# Patient Record
Sex: Female | Born: 1965
Health system: Southern US, Community
[De-identification: ages and names within clinical notes are randomized; demographics above are authoritative.]

## PROBLEM LIST (undated history)

## (undated) DIAGNOSIS — I10 Essential (primary) hypertension: Secondary | ICD-10-CM

## (undated) DIAGNOSIS — G4730 Sleep apnea, unspecified: Secondary | ICD-10-CM

## (undated) DIAGNOSIS — E785 Hyperlipidemia, unspecified: Secondary | ICD-10-CM

## (undated) DIAGNOSIS — T7840XA Allergy, unspecified, initial encounter: Secondary | ICD-10-CM

## (undated) DIAGNOSIS — R011 Cardiac murmur, unspecified: Secondary | ICD-10-CM

## (undated) DIAGNOSIS — E249 Cushing's syndrome, unspecified: Secondary | ICD-10-CM

## (undated) HISTORY — DX: Allergy, unspecified, initial encounter: T78.40XA

## (undated) HISTORY — DX: Hyperlipidemia, unspecified: E78.5

## (undated) HISTORY — DX: Cardiac murmur, unspecified: R01.1

## (undated) HISTORY — DX: Essential (primary) hypertension: I10

## (undated) HISTORY — DX: Cushing's syndrome, unspecified: E24.9

## (undated) HISTORY — DX: Sleep apnea, unspecified: G47.30

---

## 1997-02-05 HISTORY — PX: CERVICAL CONIZATION W/BX: SHX1330

## 1997-03-08 ENCOUNTER — Ambulatory Visit (HOSPITAL_COMMUNITY): Admission: RE | Admit: 1997-03-08 | Discharge: 1997-03-08 | Payer: Self-pay | Admitting: Obstetrics

## 1998-11-01 ENCOUNTER — Other Ambulatory Visit: Admission: RE | Admit: 1998-11-01 | Discharge: 1998-11-01 | Payer: Self-pay | Admitting: Obstetrics

## 1999-01-11 ENCOUNTER — Inpatient Hospital Stay (HOSPITAL_COMMUNITY): Admission: EM | Admit: 1999-01-11 | Discharge: 1999-01-14 | Payer: Self-pay | Admitting: Emergency Medicine

## 1999-05-25 ENCOUNTER — Ambulatory Visit (HOSPITAL_COMMUNITY): Admission: RE | Admit: 1999-05-25 | Discharge: 1999-05-25 | Payer: Self-pay | Admitting: Family Medicine

## 1999-05-25 ENCOUNTER — Encounter: Payer: Self-pay | Admitting: Family Medicine

## 1999-06-28 ENCOUNTER — Inpatient Hospital Stay (HOSPITAL_COMMUNITY): Admission: AD | Admit: 1999-06-28 | Discharge: 1999-06-28 | Payer: Self-pay | Admitting: Obstetrics

## 1999-09-13 ENCOUNTER — Inpatient Hospital Stay (HOSPITAL_COMMUNITY): Admission: AD | Admit: 1999-09-13 | Discharge: 1999-09-13 | Payer: Self-pay | Admitting: Obstetrics

## 1999-10-19 ENCOUNTER — Inpatient Hospital Stay (HOSPITAL_COMMUNITY): Admission: AD | Admit: 1999-10-19 | Discharge: 1999-10-19 | Payer: Self-pay | Admitting: Obstetrics

## 1999-11-29 ENCOUNTER — Encounter (INDEPENDENT_AMBULATORY_CARE_PROVIDER_SITE_OTHER): Payer: Self-pay | Admitting: *Deleted

## 1999-11-29 ENCOUNTER — Ambulatory Visit (HOSPITAL_BASED_OUTPATIENT_CLINIC_OR_DEPARTMENT_OTHER): Admission: RE | Admit: 1999-11-29 | Discharge: 1999-11-29 | Payer: Self-pay | Admitting: General Surgery

## 1999-12-07 ENCOUNTER — Inpatient Hospital Stay (HOSPITAL_COMMUNITY): Admission: AD | Admit: 1999-12-07 | Discharge: 1999-12-07 | Payer: Self-pay | Admitting: Obstetrics

## 1999-12-09 ENCOUNTER — Inpatient Hospital Stay (HOSPITAL_COMMUNITY): Admission: AD | Admit: 1999-12-09 | Discharge: 1999-12-09 | Payer: Self-pay | Admitting: Obstetrics

## 1999-12-20 ENCOUNTER — Inpatient Hospital Stay (HOSPITAL_COMMUNITY): Admission: AD | Admit: 1999-12-20 | Discharge: 1999-12-20 | Payer: Self-pay | Admitting: Obstetrics

## 2000-01-09 ENCOUNTER — Encounter (INDEPENDENT_AMBULATORY_CARE_PROVIDER_SITE_OTHER): Payer: Self-pay

## 2000-01-09 ENCOUNTER — Inpatient Hospital Stay (HOSPITAL_COMMUNITY): Admission: AD | Admit: 2000-01-09 | Discharge: 2000-01-11 | Payer: Self-pay | Admitting: Obstetrics

## 2000-01-10 HISTORY — PX: TUBAL LIGATION: SHX77

## 2000-02-20 ENCOUNTER — Other Ambulatory Visit: Admission: RE | Admit: 2000-02-20 | Discharge: 2000-02-20 | Payer: Self-pay | Admitting: Obstetrics

## 2003-05-28 ENCOUNTER — Encounter: Admission: RE | Admit: 2003-05-28 | Discharge: 2003-05-28 | Payer: Self-pay | Admitting: Family Medicine

## 2003-06-18 ENCOUNTER — Inpatient Hospital Stay (HOSPITAL_COMMUNITY): Admission: EM | Admit: 2003-06-18 | Discharge: 2003-06-21 | Payer: Self-pay | Admitting: Emergency Medicine

## 2004-04-28 ENCOUNTER — Encounter: Admission: RE | Admit: 2004-04-28 | Discharge: 2004-04-28 | Payer: Self-pay | Admitting: Family Medicine

## 2004-10-25 ENCOUNTER — Other Ambulatory Visit: Admission: RE | Admit: 2004-10-25 | Discharge: 2004-10-25 | Payer: Self-pay | Admitting: Family Medicine

## 2004-11-13 LAB — HM MAMMOGRAPHY

## 2005-04-20 ENCOUNTER — Inpatient Hospital Stay (HOSPITAL_COMMUNITY): Admission: EM | Admit: 2005-04-20 | Discharge: 2005-04-24 | Payer: Self-pay | Admitting: Emergency Medicine

## 2005-09-10 ENCOUNTER — Ambulatory Visit: Payer: Self-pay | Admitting: Family Medicine

## 2005-09-11 ENCOUNTER — Encounter: Admission: RE | Admit: 2005-09-11 | Discharge: 2005-09-11 | Payer: Self-pay | Admitting: Family Medicine

## 2006-07-15 ENCOUNTER — Ambulatory Visit: Payer: Self-pay | Admitting: Family Medicine

## 2006-08-21 ENCOUNTER — Ambulatory Visit: Payer: Self-pay | Admitting: Family Medicine

## 2006-11-05 ENCOUNTER — Ambulatory Visit: Payer: Self-pay | Admitting: Family Medicine

## 2006-11-06 ENCOUNTER — Ambulatory Visit: Payer: Self-pay | Admitting: Family Medicine

## 2006-11-06 ENCOUNTER — Encounter: Admission: RE | Admit: 2006-11-06 | Discharge: 2006-11-06 | Payer: Self-pay | Admitting: Family Medicine

## 2006-11-07 ENCOUNTER — Inpatient Hospital Stay (HOSPITAL_COMMUNITY): Admission: EM | Admit: 2006-11-07 | Discharge: 2006-11-10 | Payer: Self-pay | Admitting: *Deleted

## 2006-11-12 ENCOUNTER — Ambulatory Visit: Payer: Self-pay | Admitting: Family Medicine

## 2006-11-26 ENCOUNTER — Ambulatory Visit: Payer: Self-pay | Admitting: Family Medicine

## 2007-02-10 ENCOUNTER — Ambulatory Visit: Payer: Self-pay | Admitting: Family Medicine

## 2007-03-13 ENCOUNTER — Ambulatory Visit: Payer: Self-pay | Admitting: Family Medicine

## 2007-06-23 ENCOUNTER — Ambulatory Visit: Payer: Self-pay | Admitting: Family Medicine

## 2007-06-23 ENCOUNTER — Other Ambulatory Visit: Admission: RE | Admit: 2007-06-23 | Discharge: 2007-06-23 | Payer: Self-pay | Admitting: Family Medicine

## 2007-06-24 ENCOUNTER — Ambulatory Visit: Payer: Self-pay | Admitting: Family Medicine

## 2007-06-25 ENCOUNTER — Encounter: Admission: RE | Admit: 2007-06-25 | Discharge: 2007-06-25 | Payer: Self-pay | Admitting: Family Medicine

## 2007-07-01 ENCOUNTER — Ambulatory Visit: Payer: Self-pay | Admitting: Family Medicine

## 2007-07-25 ENCOUNTER — Ambulatory Visit: Payer: Self-pay | Admitting: Family Medicine

## 2008-01-23 ENCOUNTER — Ambulatory Visit: Payer: Self-pay | Admitting: Family Medicine

## 2008-03-11 ENCOUNTER — Ambulatory Visit: Payer: Self-pay | Admitting: Family Medicine

## 2008-09-23 IMAGING — CR DG CHEST 2V
2 series · 2 of 2 positions shown · non-contrast
Comparison: 09/11/05.

CLINICAL DATA: Cough, wheezing, history of asthma, previous smoker. 
 CHEST - 2 VIEW:

[view not recorded (1 of 2)]
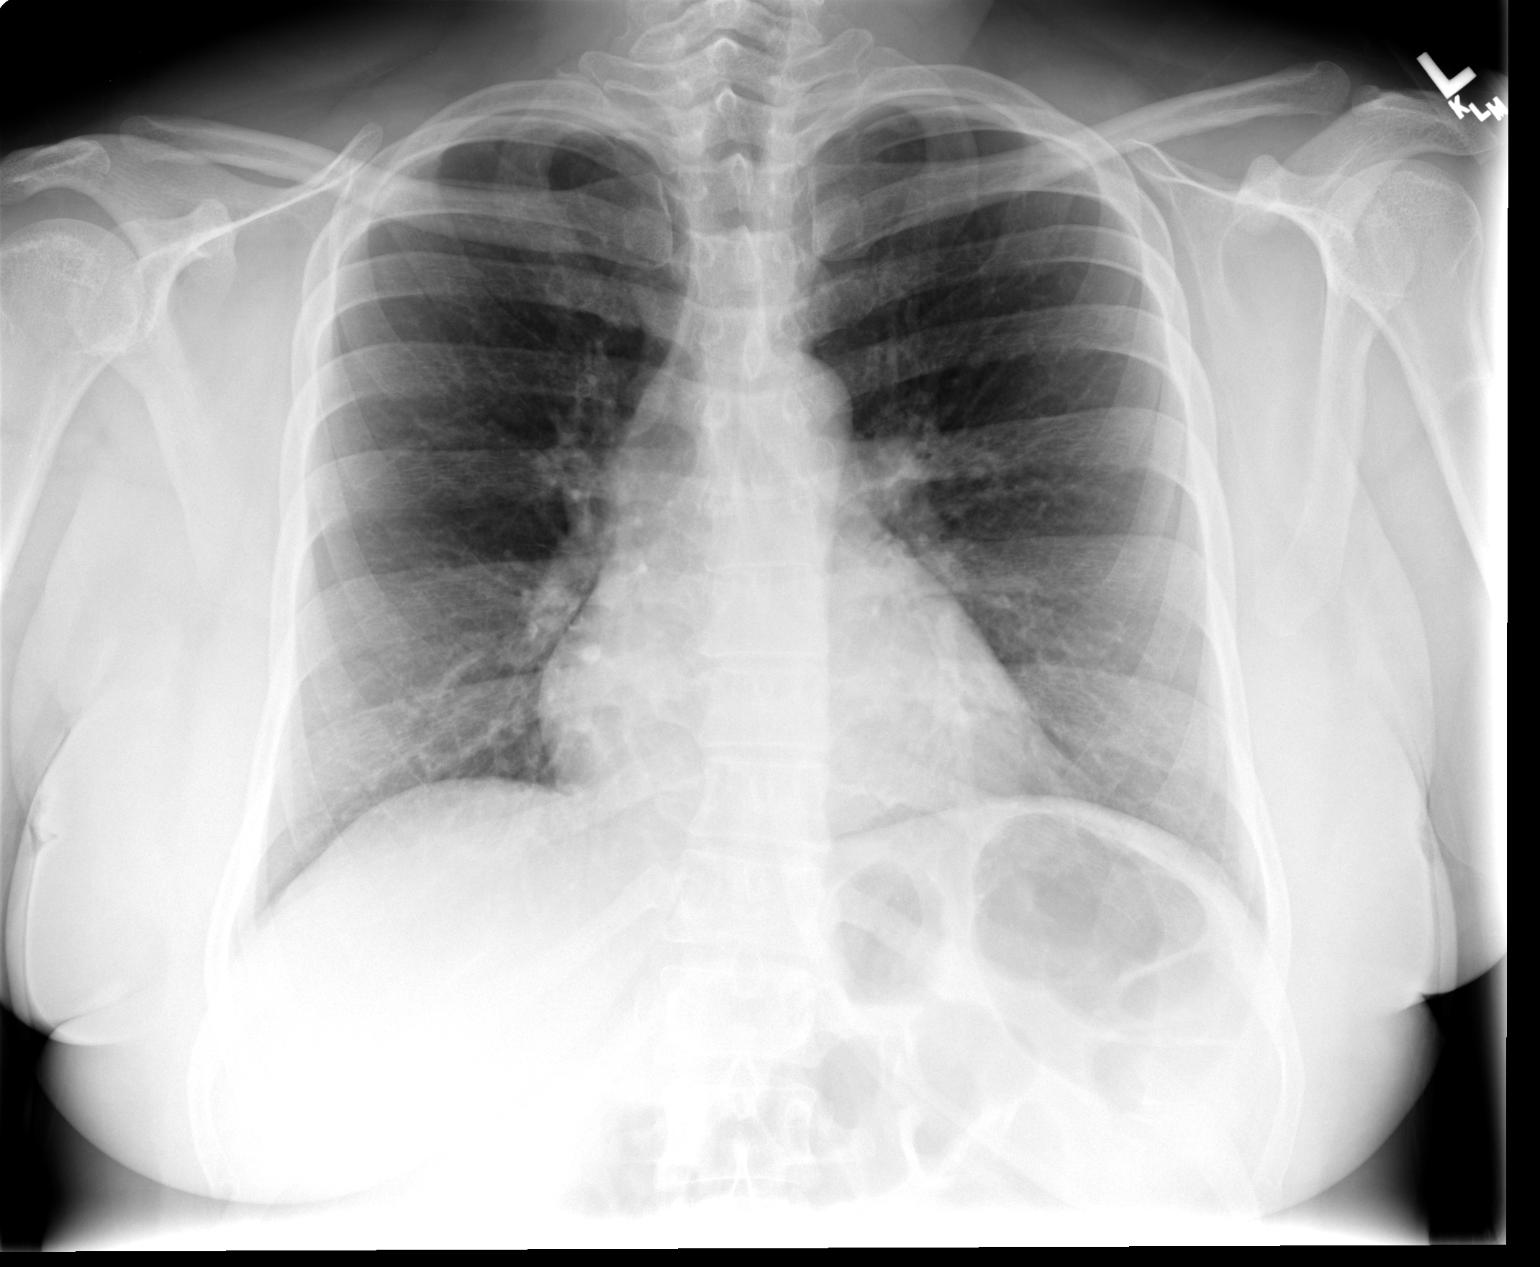

[view not recorded (2 of 2)]
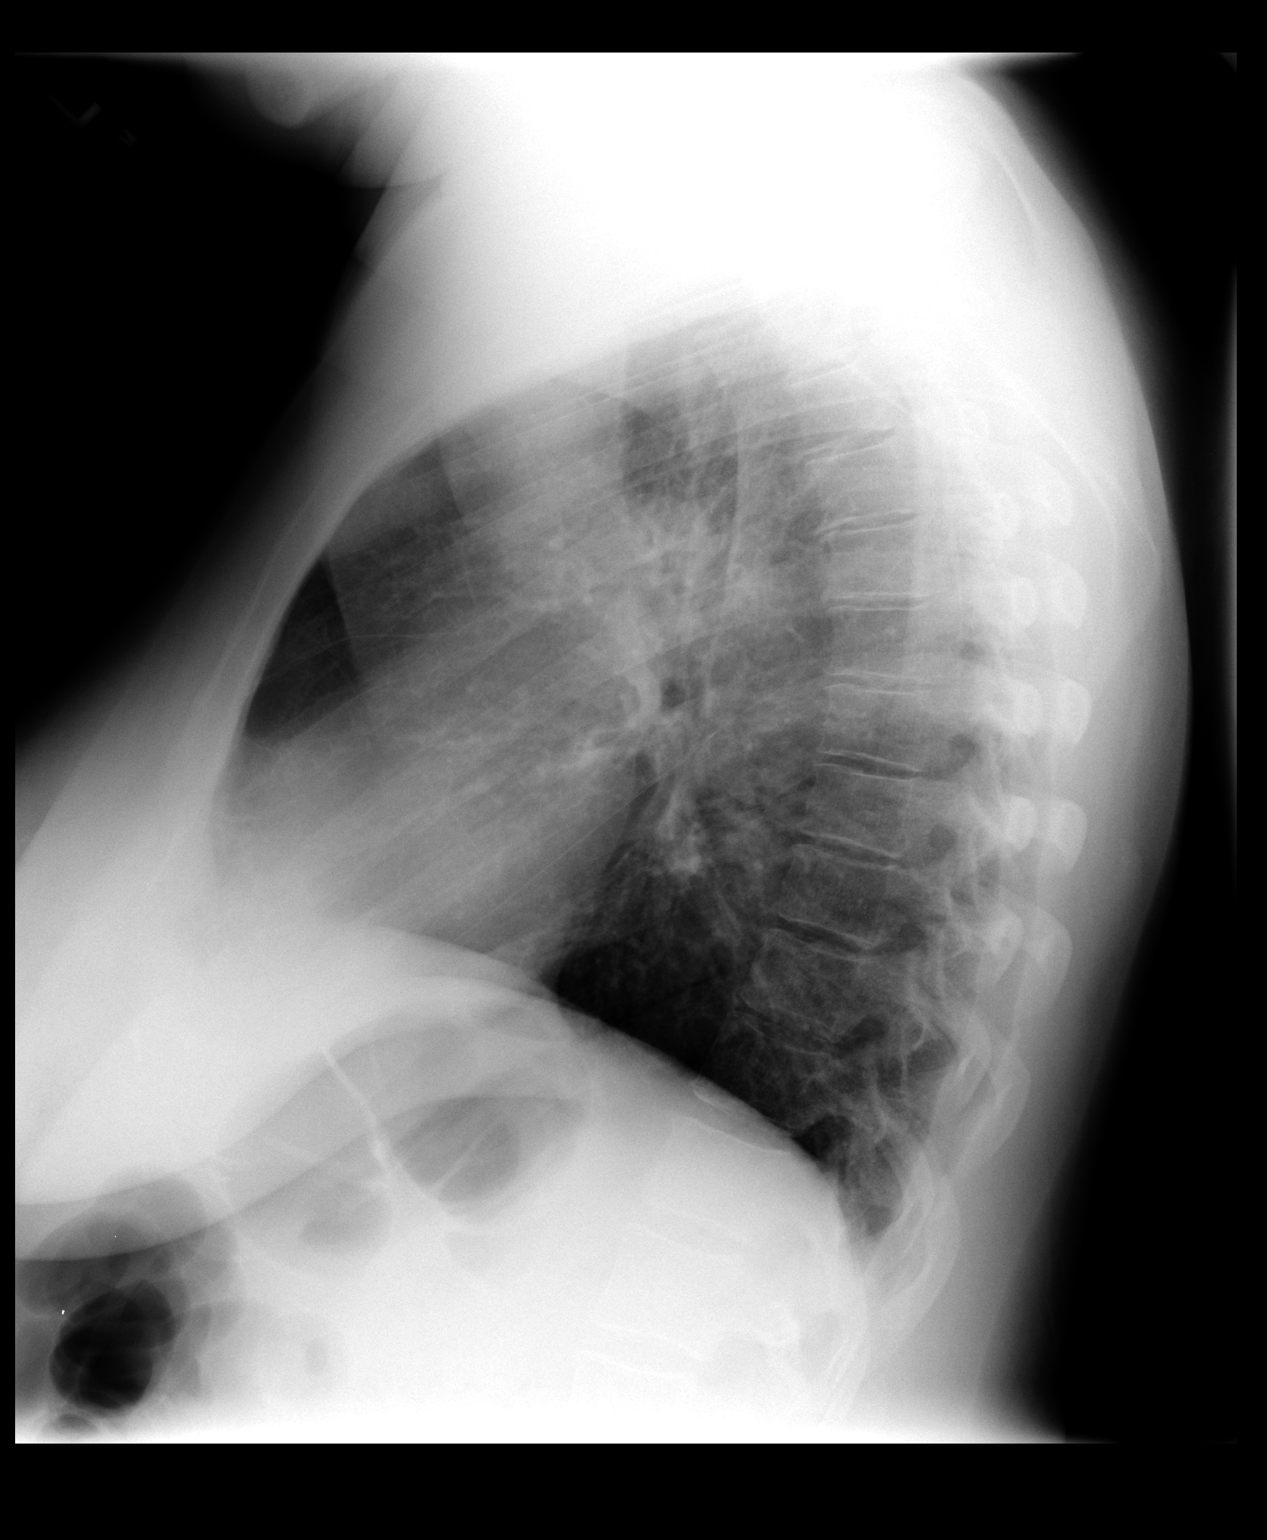

[2 of 2 positions shown; findings below may reference images not displayed]

FINDINGS: As noted previously there is mild bronchial wall thickening bilaterally compatible with chronic bronchitic changes.  No active infiltrate, consolidation, or atelectasis.  Normal cardiac size and shape.
IMPRESSION: Chronic bronchitic changes.  Negative for pneumonia.

## 2008-12-13 ENCOUNTER — Ambulatory Visit: Payer: Self-pay | Admitting: Family Medicine

## 2009-05-19 ENCOUNTER — Ambulatory Visit: Payer: Self-pay | Admitting: Family Medicine

## 2010-04-07 ENCOUNTER — Ambulatory Visit (INDEPENDENT_AMBULATORY_CARE_PROVIDER_SITE_OTHER): Payer: Federal, State, Local not specified - PPO | Admitting: Family Medicine

## 2010-04-07 DIAGNOSIS — J45909 Unspecified asthma, uncomplicated: Secondary | ICD-10-CM

## 2010-04-07 DIAGNOSIS — E119 Type 2 diabetes mellitus without complications: Secondary | ICD-10-CM

## 2010-04-07 DIAGNOSIS — Z9119 Patient's noncompliance with other medical treatment and regimen: Secondary | ICD-10-CM

## 2010-04-07 DIAGNOSIS — J0100 Acute maxillary sinusitis, unspecified: Secondary | ICD-10-CM

## 2010-04-07 DIAGNOSIS — Z91199 Patient's noncompliance with other medical treatment and regimen due to unspecified reason: Secondary | ICD-10-CM

## 2010-06-20 NOTE — Discharge Summary (Signed)
[name], [name]             ACCOUNT NO.:  [id number]   MEDICAL RECORD NO.:  [id number]          PATIENT TYPE:  INP   LOCATION:  1533                         FACILITY:  [facility]   PHYSICIAN:  Michaelyn Barter, M.D. DATE OF BIRTH:  02/28/1965   DATE OF ADMISSION:  11/07/2006  DATE OF DISCHARGE:                               DISCHARGE SUMMARY   PRIMARY CARE PHYSICIAN:  Sharlot Gowda, M.D.   FINAL DIAGNOSES:  1. Acute asthma exacerbation.  2. Hypokalemia.  3. Hypertension, uncontrolled.   HISTORY OF PRESENT ILLNESS:  [name] is a 45 year old female with a  past medical history of asthma, who arrived with a chief complaint of  shortness of breath.  She stated that her symptoms started approximately  5 days prior to this admission.  She developed head congestion, followed  by coughing, as well as nasal congestion and rhinorrhea.  She also  complained of sore throat and a left earache.  She was started on a Z-  Pack three days prior to this admission by her primary care doctor.  She  also received an intramuscular injection of Solu-Medrol.  It appears  that she had a follow-up appointment with her primary care doctor;  however, her symptoms persisted.  Therefore she failed outpatient  therapy, and she was subsequently admitted into the hospital.   PAST MEDICAL HISTORY:  For past medical history, please see that  dictated by Dr. Elliot Cousin.   HOSPITAL COURSE:  1. Acute asthma exacerbation.  Chest x-ray was ordered on October 2,      which revealed no focal airspace disease.  She was admitted to the      medicine floor and started on IV Solu-Medrol, as well as nebulizer      reading treatment.  Over the next 24 to 48 hours, her breathing      improved, and her shortness of breath gradually resolved.  She did      complain of a persistent cough.  Moxifloxacin was started      empirically.  She has been afebrile.  As her symptoms improved, the      IV Solu-Medrol was stopped, and  p.o. prednisone was started.  The      patient currently denies having any shortness of breath.  2. Hypokalemia.  The patient has received supplementation with      potassium, during the course of his hospitalization.  3. Hypertension.  The patient denied having a history of hypertension;      however, her blood pressure did remain between 140s and 150s      systolically, throughout her hospitalization.  Norvasc 2.5 mg p.o.      q. daily was initiated.   At the time of discharge, the patient currently states that she feels  significantly better.  She does still complain of a cough; however, her  shortness of breath has currently resolved.  Her temperature is 97.7,  her heart rate is 75, respirations 16, blood pressure 116/78, O2 sat  100% on room air.  The decision has been made to discharge the patient  from the hospital.  DISCHARGE MEDICATIONS:  She will be discharged home on:  1. Singulair 10 mg p.o. q. daily.  2. Advair Diskus 500/50.  3. Prednisone tapering Dosepak.  4. Mucinex 600 mg p.o. q.12 hours.  5. Norvasc 2.5 mg p.o. q. daily.  6. Moxifloxacin 400 mg p.o. q. daily.  7. Combivent MDI 2 puffs q.i.d.   She will be told to follow up with her primary care doctor within one  week.      Michaelyn Barter, M.D.  Electronically Signed     OR/MEDQ  D:  11/10/2006  T:  11/10/2006  Job:  [id number]   cc:   Sharlot Gowda, M.D.  Fax: [phone]

## 2010-06-20 NOTE — H&P (Signed)
[name], [name]             ACCOUNT NO.:  [id number]   MEDICAL RECORD NO.:  [id number]          PATIENT TYPE:  INP   LOCATION:  0104                         FACILITY:  [facility]   PHYSICIAN:  Elliot Cousin, M.D.    DATE OF BIRTH:  Jun 24, 1965   DATE OF ADMISSION:  11/07/2006  DATE OF DISCHARGE:                              HISTORY & PHYSICAL   PRIMARY CARE PHYSICIAN:  Dr. Sharlot Gowda.   CHIEF COMPLAINT:  Shortness of breath, wheezing, headache.   HISTORY OF PRESENT ILLNESS:  The patient is a 45 year old woman with a  past medical history significant for asthma, allergic rhinitis,  thalassemia trait, and anxiety.  She presents to the emergency  department with a chief complaint of shortness of breath, wheezing, and  headache.  Her symptoms started approximately 5 days ago.  The first  symptom she experienced was head congestion.  The following day, she  says that the head congestion moved to her chest.  She started coughing  mostly dry, but on occasion productive with yellow sputum.  She also  developed nasal congestion and rhinorrhea.  She also had a mild sore  throat and left earache.  She had associated subjective fever and chills  twice.  She went to see her primary care physician Dr. [name]  who  started her on a Z-Pak 3 days ago.  She was also given nebulizers in the  office.  The following day, 2 days ago, she presented again to Dr.  [name] office and at that time, he treated her with an intramuscular  dose of Solu-Medrol and several nebulizer treatments.  She went home and  returned again to Dr. [name] office yesterday.  He gave her Solu-  Medrol again and more Xopenex nebulizers.  She was also treated with  guaifenesin and Tylenol for the headache.  Earlier today, she was  started on a prednisone dose taper.  She took a total of 60 mg of  prednisone today.  However, because her symptoms were persistent, she  was advised to come to the emergency department.   During the evaluation in the emergency department, the patient is noted  to be afebrile and hemodynamically stable.  She is oxygenating 99%  following continuous albuterol nebulizers for approximately an hour to  an hour and a half .  She says that she feels better and less tight.  She currently has no sore throat, although she does feel achy, has some  shortness of breath, and has a left frontal headache without visual  changes, stiff neck or photophobia.  Her chest x-ray revealed stable  mild bronchitic changes which are chronic but no acute abnormalities.  Her white blood cell count is elevated at 18.9.  Her serum potassium is  3.3.  She will be admitted for further evaluation and management.   PAST MEDICAL HISTORY:  1. Asthma, diagnosed at 45 years of age.  No history of intubation,      although  apparently she came close to intubation in May 2005.  She      has 2 previous hospitalizations for exacerbations in the [facility]  system, once in May 2005 and again in March 2007.  2. Allergic rhinitis.  3. Thalassemia trait with microcytosis  4. History of steroid-induced hyperglycemia.  5. Chronic anxiety.   MEDICATIONS:  1. Advair discus 500/50 1 puff b.i.d.  2. Albuterol nebulizer as needed.  3. Azithromycin day #4 of a Z-Pak  4. Prozac 20 mg daily  5. Singulair 10 mg daily.  6. Proventil MDI as needed.  7. Nasonex two squirts in each nostril once daily.   ALLERGIES:  The patient has an allergy to ROCEPHIN.   SOCIAL HISTORY:  The patient is twice married.  [place]  She has three children.  She is employed as a Chief of Staff.  She smokes two to three cigarettes per day.  She denies  illicit drug use and alcohol use.   FAMILY HISTORY:  Her mother is 38-years of age and has hypertension.  Her father is 15 years of age and has no chronic medical conditions.   Review of systems otherwise negative.   EXAM:  Temperature 98.2, blood pressure  140/82, pulse 87, respiratory  rate 20, oxygen saturation 99% on 3-4 liters of nasal cannula oxygen.  GENERAL: The patient is a pleasant overweight 45 year old African  American woman who is currently sitting up in bed in improved condition.  HEENT: Head is normocephalic, nontraumatic.  Pupils equal, round,  reactive to light.  Extraocular muscles are intact.  Conjunctivae are  clear.  Sclerae are white.  Tympanic membrane on the right is mildly  obscured by cerumen but no apparent abnormalities.  Tympanic membrane on  the left is totally clear with no evidence of infection.  Nasal mucosa  is edematous.  No sinus tenderness.  Oropharynx reveals moist mucous  membranes.  No posterior exudates or erythema.  NECK is supple.  No adenopathy, no thyromegaly, no bruit, no JVD and no  tenderness.  LUNGS: Mild diffuse expiratory wheezes and crackles.  Air  movement is audible down to the bases.  Breathing is currently  nonlabored.  HEART: S1-S2 no murmurs, rubs or gallops.  ABDOMEN: Positive bowel sounds, soft, nontender, nondistended, no  hepatosplenomegaly, no masses palpated.  GU/RECTAL: Deferred  EXTREMITIES:  Pedal pulses 2+ bilaterally.  No pretibial edema and no  pedal edema.  NEUROLOGIC: The patient is alert and oriented x3.  Cranial  nerves II-XII are intact.  Strength is 5/5 throughout.  Sensation is  intact.   ADMISSION LABORATORIES:  Chest x-ray results are above.  WBC 18.9,  hemoglobin 13, MCV 74, platelets 360.  Sodium 134, potassium 3.3,  chloride 101, CO2 23, glucose 116, BUN 10, creatinine 0.78, calcium 9.1,  total protein 6.9, albumin 3.7, AST 38.   ASSESSMENT.:  1. Acute asthma exacerbation. She has failed outpatient therapy.  2. Stable mild bronchitic changes per chest x-ray.  3. Leukocytosis probably secondary to outpatient steroids.  4. Hypokalemia, secondary to beta agonist and steroids.   PLAN:  1. The patient will be admitted for further evaluation and  management.  2. She was given continuous albuterol nebulizers in the emergency      department.  Will continue nebulizer treatment with Xopenex and      Atrovent every 4 hours and then every 2 hours as needed.  3. We will also treat with intravenous Solu-Medrol and theophylline as      the patient says that in the past, theophylline helped to break the      asthma attack sooner.  We  will also give the patient 1 gram of      magnesium sulfate.  4. Symptomatic and supportive treatment with Claritin, Singulair, and      Pepcid.  5. Will replete potassium chloride.  6. Tobacco cessation counseling.  7. We will start sliding scale NovoLog empirically anticipating that      her capillary blood glucose will increase on steroids.  8. We will also start empiric Avelox antibiotic treatment daily.      Elliot Cousin, M.D.  Electronically Signed     DF/MEDQ  D:  11/08/2006  T:  11/08/2006  Job:  [id number]   cc:   Sharlot Gowda, M.D.  Fax: [phone]

## 2010-06-23 NOTE — H&P (Signed)
NAME:  [name], [name]                       ACCOUNT NO.:  [id number]   MEDICAL RECORD NO.:  [id number]                   PATIENT TYPE:  EMS   LOCATION:  ED                                   FACILITY:  [facility]   PHYSICIAN:  Elliot Cousin, M.D.                 DATE OF BIRTH:  14-Aug-1965   DATE OF ADMISSION:  06/18/2003  DATE OF DISCHARGE:                                HISTORY & PHYSICAL   PRIMARY CARE PHYSICIAN:  Sharlot Gowda, M.D.   CHIEF COMPLAINT:  Shortness of breath, wheezing, and dry cough.   [name] is a 45 year old lady with a past medical history significant for  asthma, who presents to the emergency department this morning with a three  to four-day history of increasing and progressive shortness of breath,  wheezing, and dry cough.  The patient has a history of asthma; however, she  has not had an asthma attack in five years.  She has no history of  intubation.  She has been working on and cleaning and painting a new home  over the past month.  She has used various cleaning solutions, sanding  paper, and different paints.  She has been using her albuterol MDI two puffs  approximately four to five times a day at he beginning of her symptoms  several days ago.  The albuterol MDI stopped working, and therefore she  started using her albuterol nebulizers.  Over the past 24 hours she has used  the albuterol nebulizer every hour for the past 12-14 hours with no apparent  relief in her shortness of breath and wheezing.  Prior to her latest asthma  exacerbation, she would use her albuterol inhaler just only periodically,  maybe two to three times per week.  The patient has allergic rhinitis, and  she is allergic to POLLEN, CATS, SMOKE, DUST, TREES, and PERFUMES.  She  takes Claritin 10 mg daily, it not daily at least four to five days per  week.  She denies any upper respiratory infection symptoms, although  approximately a week or two ago she did have a sore throat and one  episode  of subjective fever and chills.  She has not had any pleurisy, headache,  night sweats, purulent cough, chest pain, nausea, vomiting, or diarrhea.  She does smoke three to four cigarettes per day, and she has been doing so  on and off for five years.   When the patient was evaluated in the emergency department, her blood  pressure was elevated at 176/116, her temperature was 98.8, her pulse was  138, respiratory rate 24, and oxygen saturation on room air was 92%.  The  patient was evaluated by Dr. Freida Busman.  She has been given two albuterol  treatments and Solu-Medrol 125 mg IV x1.  The patient says that she feels a  little better; however, she is still tight.   PAST MEDICAL HISTORY:  1. Asthma diagnosed  at 45 years of age.  No history of intubation.  2. Allergic rhinitis.  3. Status post bilateral tubal ligation in December 2001.  4. Tobacco abuse.   MEDICATIONS:  1. Albuterol nebulizers q.1-4h. p.r.n. (the patient rarely uses her     albuterol nebulizers with the exception of this recent exacerbation).  2. Albuterol MDI two puffs q.4h. p.r.n. (the patient usually uses albuterol     MDI two to three times per week).  3. Claritin 10 mg daily.  4. Theophylline 30 mg b.i.d.   ALLERGIES:  No known drug allergies.   SOCIAL HISTORY:  The patient is married and lives in [place] with her  husband and three children.  She is unemployed.  Her husband is employed.  She smokes three to four cigarettes per day and has been doing so for  approximately five years.  She denies illicit drug use.  She denies alcohol  abuse.  She is a Buyer, retail of high school.  She can read and write.   FAMILY HISTORY:  Her mother is living.  She is 43 years of age and has only  peripheral edema.  Her father is 39 years of age, and he is living and has  no apparent health problems.   REVIEW OF SYSTEMS:  Above in the history of present illness, otherwise  negative.   PHYSICAL EXAMINATION:  VITAL SIGNS:   Temperature 98.8, blood pressure  176/116, pulse 138, respiratory rate 24, oxygen saturation on room air 92%.  GENERAL:  The patient is a pleasant, obese 45 year old African-American  woman in mild respiratory distress.  HEENT:  Head is normocephalic, nontraumatic.  Pupils equal, round, and  reactive to light, extraocular movements are intact.  Conjunctivae are  clear, sclerae are white.  Tympanic membranes are clear bilaterally.  Nasal  mucosa is moist.  She does have inflamed nares, no sinus tenderness.  No  drainage.  The oropharynx reveals moist mucous membranes.  No posterior  exudates or erythema.  The teeth are in good repair.  NECK:  Supple, mildly obese, nontender.  No thyromegaly, no JVD, no bruit.  CHEST:  Lungs reveal diffuse expiratory wheezes throughout the lung fields.  Breathing is slightly labored and slightly tachypneic.  She is able to speak  in short sentences with some dyspnea.  CARDIAC:  S1, S2 with tachycardia.  ABDOMEN:  Obese, positive bowel sounds, soft, nontender, nondistended, no  hepatosplenomegaly.  EXTREMITIES:  Pedal pulses 2+ bilaterally.  No pretibial edema.  No pedal  edema.  NEUROLOGIC:  The patient is alert and oriented x3.  Cranial nerves II-XII  are intact.  Strength is 5/5 throughout.  Sensation is intact throughout.   ADMISSION LABORATORY DATA:  Chest x-ray reveals mild peribronchial  thickening.  WBC 10.6, hemoglobin 14.1, hematocrit 43.2, MCV  74.6,  platelets 356.  Sodium 141, potassium 3.1, chloride 110, CO2 26, glucose  119, BUN 6, creatinine 1.0, calcium 9.5.   ASSESSMENT:  1. Asthma exacerbation.  The exacerbation was probably triggered by the     patient's recent exposure to cleaning agents and paint.  She does have a     number of triggers for her asthma and for her allergic rhinitis.  She may     or may not have had a mild upper respiratory infection a week or two ago.    She is currently afebrile and does not have a purulent cough.   The     patient has failed self-treated outpatient therapy after treating herself  with many nebulizer treatments over the past 24 hours.  2. Hypokalemia.  The hypokalemia is probably secondary to the albuterol     nebulizers.  3. Elevated blood pressure.  The patient has no history of hypertension.     The patient's elevated blood pressure may be secondary to stress and/or     the albuterol.  4. Sinus tachycardia.  The tachycardia is probably secondary to the     albuterol.  5. Microcytosis.  The patient's hemoglobin is 14.1, hematocrit 43.1;     however, the MCV is 74.6.  The patient is a menstruating female.  No     history of abnormal rectal bleeding or uterine bleeding.   PLAN:  1. The patient has been given two nebulizer treatments in the emergency     department.  She was also given Solu-Medrol 125 mg IV x1.  2. Will continue nebulizer treatments with Xopenex continuously x1 hour and     then q.2h. thereafter.  Will also add Atrovent nebulizer q.4h.  3. Will give magnesium sulfate IV 2 g over one hour.  4. Will continue theophylline 200 mg t.i.d.  Will check a theophylline     level.  5. Will add Claritin at 10 mg daily as well as Pepcid 20 mg daily.  6. Will add Nasonex one spray in each nostril b.i.d. as well as Advair     Diskus 100/50 one inhalation b.i.d.  7. Will anticipate elevated blood sugars and will cover with a sliding scale     insulin regimen.  8. Will check a CMET in the a.m. and will check a baseline EKG now.  9. The patient will be admitted to the step-down unit for now.                                               Elliot Cousin, M.D.    DF/MEDQ  D:  06/18/2003  T:  06/18/2003  Job:  [id number]   cc:   Sharlot Gowda, M.D.  [address]  [place], [place] [address]  Fax: [phone]

## 2010-06-23 NOTE — Discharge Summary (Signed)
NAMEJORDYAN, [name]             ACCOUNT NO.:  [id number]   MEDICAL RECORD NO.:  [id number]          PATIENT TYPE:  INP   LOCATION:  6736                         FACILITY:  [facility]   PHYSICIAN:  Nelma Rothman, MD   DATE OF BIRTH:  06-06-1965   DATE OF ADMISSION:  04/20/2005  DATE OF DISCHARGE:  04/24/2005                                 DISCHARGE SUMMARY   DISCHARGE DIAGNOSES:  1.  Acute asthma exacerbation.  2.  Chronic sinusitis.  3.  Hypertension.  4.  Hypokalemia, in part secondary to hydrochlorothiazide  5.  History of hyperlipidemia.   PROCEDURES:  1.  Two views of the chest on March16 demonstrated chronic interstitial      prominence without evidence of acute or superimposed abnormality.  2.  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  They recommended MRI would be helpful for further      evaluation.  3.  MRI on March19 demonstrated benign appearing 20 mm right axilla mass      without soft tissue component consistent with a benign odontogenic      tumor, for example, ameloblastoma versus odontoma.  There was a second      similar lesion 12 mm in diameter in the left mandible.   LABORATORY DATA:  On admission white blood cell count 5.9, potassium was low  at 2.9 and was repleted and is currently 3.8, hemoglobin A1C 6. Total  cholesterol 170, triglycerides 80, HDL 49, LDL 105. TSH was 0.514 and free  T4 was 8.0.   HISTORY AND PHYSICAL:  Please see dictated admission history and physical by  Dr. Robb Matar on [phone], but briefly, [name] a pleasant 45 year old  lady with a known history of asthma who presented with increasing shortness  of breath felt to be consistent with an acute asthma  exacerbation. She was  admitted for further evaluation and management.   HOSPITAL COURSE:  Problem 1:  Acute asthma exacerbation. She was started on intravenous Solu-  Medrol as well as frequent nebulizer treatments. Chest x-ray did not  demonstrate any acute abnormality, although she complained of increased  sinus pressure which was felt possibly secondary to an acute sinusitis  superimposed on chronic sinusitis. For this reason, she was started on  levofloxacin in addition to steroid burst. With this, she improved  dramatically over the first 48 hours of admission and is doing quite well.  She will be discharged home to complete a total 10 day course of Levaquin as  well as a prednisone dose pack.   Problem 2:  Chronic sinusitis with acute exacerbation. Her symptoms were  felt consistent with an acute exacerbation of her chronic sinusitis. For  this, she was started on Levaquin as above. A CT was done for further  evaluation which was  relatively unremarkable although an incidental note was  made of a benign appearing right maxillary mass. Radiology did recommend an  MRI for possible further evaluation, which will be discussed below. She will  complete a 10 day course of Levaquin and resume her allergic rhinitis  medications as previously.   Problem 3:  Benign odontogenic tumor. As noted above, an incidental note was  made of a right maxillary mass of approximately 2 cm in diameter on CT of  the maxillary sinuses. An MRI was done for further evaluation and  demonstrated only benign appearing lesion measuring 20 mm in the right  maxilla and 12 mm left mandible. Radiology favors benign odontogenic tumor  as the etiology, for example, ameloblastoma versus odontoma.  No further  follow up is recommended at this time.   Problem 4:  Steroid-induced hyperglycemia. With high dose intravenous  steroids, her blood sugars were generally elevated in the high 100s. She was  covered with sliding  scale insulin while here in the hospital. Her  hemoglobin A1C was 6 which may demonstrate an element of glucose  intolerance, but no overt diabetes. For that reason, we will go ahead and  discontinue any insulin at this time. I suspect that as her prednisone is  tapered she will not require any further and hypoglycemic medications.   Problem 5:  Hypokalemia. The patient was found to be mildly hypokalemic with  a potassium of 2.9 on admission. This was felt at least in part secondary to  her hydrochlorothiazide. She was started on daily supplements and with this  her potassium increased adequately to 3.8. She will be discharged home with  20 mEq of potassium daily to take with her hydrochlorothiazide. This can be  followed up at the leisure of her primary care physician. She should  probably have her a repeat potassium checked next time she follows up with  her regular doctor.   DISCHARGE MEDICATIONS:  Prednisone dose pack 10 mg six day course take as  directed, Levaquin 500 mg p.o. daily times six more days, albuterol MDI 2  puffs inhaled every 4 hours p.r.n., Advair 500/50 1 puff b.i.d., Lipitor 20  mg p.o. daily, hydrochlorothiazide 12.5 mg p.o. daily, Mucinex 1 tablet p.o.  b.i.d. p.r.n., and K-Dur 20 mEq p.o. daily.   DISCHARGE INSTRUCTIONS:  She is to adhere to a low concentrated sweet diet  for the time being, then no restriction. She will follow up with Dr. [name]  as previously scheduled. She is to have a repeat potassium check at her next  visit with him.      Nelma Rothman, MD  Electronically Signed     RAR/MEDQ  D:  04/24/2005  T:  04/25/2005  Job:  [id number]   cc:   Sharlot Gowda, M.D.  Fax: [phone]

## 2010-06-23 NOTE — Discharge Summary (Signed)
[facility] [place]  Patient:    [name], [name]                      MRN: [id number] Adm. Date:  01/09/00 Disc. Date: 01/11/00 Attending:  Kathreen Cosier, M.D.                           Discharge Summary                                This is a 45 year old gravida 4, para 3-0-1-3 who was admitted for induction.  Her hemoglobin on admission was 11.1.  Post delivery 9.7.  White count 10.7/15.5.  She was admitted on December 4 and was induced with Pitocin and had a normal vaginal delivery of a female weighing 7 pounds 2 ounces.  She desired sterilization and on December 5 underwent postpartum tubal ligation.  She did fine and was discharged home on the second postpartum day, ambulatory, on a regular diet, on Tylenol No. 3 to see me in six weeks.  DISCHARGE DIAGNOSES:          Status post normal vaginal delivery post induction and postpartum tubal ligation. DD:  01/11/00 TD:  01/11/00 Job: [id number]

## 2010-06-23 NOTE — H&P (Signed)
[name], [name]             ACCOUNT NO.:  [id number]   MEDICAL RECORD NO.:  [id number]          PATIENT TYPE:  INP   LOCATION:  6736                         FACILITY:  [facility]   PHYSICIAN:  Jonna L. Robb Matar, M.D.DATE OF BIRTH:  [birth year]   DATE OF ADMISSION:  04/20/2005  DATE OF DISCHARGE:                                HISTORY & PHYSICAL   PRIMARY CARE PHYSICIAN:  Sharlot Gowda, M.D.   CHIEF COMPLAINT:  Shortness of breath.   HISTORY:  This 45 year old African American has been asthmatic since he was  73-years-old.  A few days ago, she started to get an upper respiratory  infection with generalized congestion and tenderness over the sinuses and 3  days ago her asthma started to flare up with a nonproductive cough,  wheezing, tightness, headache, and chest pain on her left side.  Despite an  increasing use of albuterol inhalers, it did not clear up and she is in the  emergency room, where she has been given Xopenex, Solu-Medrol, and  potassium.   PAST MEDICAL HISTORY:  1.  Allergic rhinitis.  She is allergic to pollen, cats, smoke, trees, and      perfume.  2.  Thalassemia trait with some microcytosis.  3.  Hypercholesterolemia.  4.  Peripheral edema.   OPERATION:  Tubal ligation, 2001.   FAMILY HISTORY:  A sister and aunt both have thyroid disease.   SOCIAL HISTORY:  Nondrinker and no drugs.  She smokes about a 1/4 of a pack  per day.  She is married, has three children.   She has no drug allergies.   MEDICATIONS:  1.  Lipitor 20 every day.  2.  Singulair 10 every day.  3.  Advair 500/50 b.i.d.  4.  Hydrochlorothiazide 12.5 every day.  5.  Albuterol inhaler.   REVIEW OF SYSTEMS:  All 12 were reviewed.  She is potassium mostly for the  findings in the acute history.  She has a normal menstrual cycle, every 24  to 27 days.  She has a history of kidney stones.  There were no other  medical issues noted.  The patient had a recent mammogram which was normal.   PHYSICAL EXAMINATION:  VITAL SIGNS:  Temp 100.8, pulse was 130 - down to  about 102 now, respirations 26 - down to 22, blood pressure 104/71.  GENERAL:  She is a cushingoid-appearing, anxious, African American female  with some mild to moderate respiratory distress.  HEENT:  Conjunctivae and lids are normal.  Pupils are reactive.  Extraocular  movements are full.  She has normal hearing, mucosa, and pharynx.  She has  some tenderness over the sinuses.  NECK:  Shows no mass or thyromegaly.  LUNGS:  Respiratory effort is increased.  The lungs have diffuse expiratory  wheeze.  No rales, rhonchi, or dullness.  HEART:  She is tachycardic.  Normal S1 and S2 without murmurs, rubs, or  gallops.  There is no cyanosis or clubbing.  She has some trace peripheral  edema.  BREASTS:  Large without masses, tenderness, or discharge.  ABDOMEN:  Obese, nontender.  No hepatosplenomegaly or  hernia.  MUSCULOSKELETAL:  She has no cervical adenopathy.  Muscle strength is 5/5  with full range of motion all four extremities.  SKIN:  Shows no rash, lesions, or nodules.  NEUROLOGIC:  Cranial nerves are intact.  DTRs are 1+ in the knees.  Sensation is normal.  She is alert and oriented x3.  Normal memory and  judgment.  Slightly anxious affect.   LABORATORY DATA:  Chest x-ray shows chronic interstitial changes but no  acute disease.  Potassium is down to 2.9.  BUN 5, creatinine 1.1.  White  count 5.9, hemoglobin 12.7 with 75% neutrophils.   IMPRESSION:  1.  Chronic asthma with an acute exacerbation.  The patient had problems      during her last stay with getting very tachycardic from the nebs and      required Cardizem and getting hyperglycemic from the steroids requiring      insulin.  I will keep her instead on Xopenex, give her a lower dose of      steroids, and monitor for complications.  2.  Hypokalemia.  This will be replaced.  She was hypokalemic the last time      she was in the hospital and this was  attributed to albuterol.  However,      she has edema.  She looks cushingoid and I think as an outpatient in six      to eight weeks off her steroids she needs a workup to rule out Cushing      syndrome.  In the meantime, I will switch her from hydrochlorothiazide      to Maxzide and add on some extra potassium.  3.  Upper respiratory tract infection, probably more viral or atypical since      her white count is not elevated but I will cover her with some Rocephin      along with Zithromax just to be sure.  4.  Hyperlipidemia.  5.  Allergic rhinitis.      Jonna L. Robb Matar, M.D.  Electronically Signed     JLB/MEDQ  D:  04/20/2005  T:  04/22/2005  Job:  [id number]   cc:   Sharlot Gowda, M.D.  Fax: [phone]

## 2010-06-23 NOTE — Op Note (Signed)
[place]  Patient:    [name], [name]                    MRN: [id number] Proc. Date: 11/29/99 Adm. Date:  [id number] Attending:  Caleen Essex                           Operative Report  PREOPERATIVE DIAGNOSIS:  Mass of the left forearm.  POSTOPERATIVE DIAGNOSIS:  Mass of the left forearm.  PROCEDURE:  Excision mass of the left forearm.  SURGEON:  Dr. Carolynne Edouard.  ANESTHESIA:  Local.  PROCEDURE:  After informed consent was obtained the patient was brought to the operating room and placed in the supine position on the operating room table. Given that she was pregnant her right side was bumped up so that she could be comfortable.  Her left arm was placed out lateral on an arm board and her left forearm was prepped with Betadine and draped in the usual sterile manner.  1% lidocaine was then infiltrated around the approximately 1 cm mass lesion in the left forearm until adequate anesthesia was obtained.  A 15 blade knife was then used to ellipse out a piece of skin over top of the mass as well as this incision was carried down into the subcutaneous tissue using a 15 blade knife.  At this point the mass was removed from the surrounding subcutaneous tissue using a combination of sharp dissection with the 15 blade knife and the Bovie electrocautery and blunt dissection with the hemostat.  Once the mass was removed it was sent to pathology for further evaluation.  The wound was examined and several small bleeding points were coagulated with the Bovie electrocautery.  The skin was then closed with several interrupted 4-0 MonocryL subcuticular stitches, benzoin and Steri-Strips and a sterile dressing were applied.  The patient tolerated the procedure well.  At the end of the case all needle, sponge and instrument counts were correct. The patient was then taken to the recovery room in stable condition. DD:  11/29/99 TD:  11/29/99 Job: [id number]

## 2010-06-23 NOTE — Discharge Summary (Signed)
NAME:  [name], [name]                       ACCOUNT NO.:  [id number]   MEDICAL RECORD NO.:  [id number]                   PATIENT TYPE:  INP   LOCATION:  0153                                 [facility]   PHYSICIAN:  Renato Battles, M.D.                  DATE OF BIRTH:  Mar 30, 1965   DATE OF ADMISSION:  06/18/2003  DATE OF DISCHARGE:  06/21/2003                                 DISCHARGE SUMMARY   DISCHARGE DIAGNOSES:  1. Severe asthma attack.  The patient was very closely intubated and has     required intensive care unit admission.  2. Steroid-induced hyperglycemia.  3. Microcytosis, likely secondary to thalassemia trait, but the hemoglobin     is normal.  4. Hypokalemia secondary to albuterol-induced.  5. Albuterol-induced hypertension.  6. Albuterol-induced tachycardia.   MEDICATIONS:  1. Advair Diskus 100/50 1 dose b.i.d.  2. Theophylline 300 mg p.o. b.i.d.  3. Singulair 10 mg p.o. daily.  4. Albuterol/Atrovent nebulizers 4 times a day as needed.  5. Prednisone 60 mg taper.  To go down 10 mg every third day.  6. Percocet 10 mg p.o. q.6h. p.r.n. for headaches.  Was given a dose only     for two days, 8 tablet prescription, with no refills.   HISTORY AND PHYSICAL/HOSPITAL COURSE:  The patient is a very pleasant 45-  year-old African-American female who presented to the emergency department  with a few-day history of progressive shortness of breath, wheezing, and  cough.  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  In the emergency room, the patient was noted to have oxygen  saturation of 92% on room air.  She was in severe respiratory distress.  Blood pressure was very elevated at 117/116.  She received multiple back-to-  back albuterol and Atrovent treatments.  As a result, had tachycardia  with a  heart rate in the 160s at times.  Yet, she continued to have very loud  wheezing and limited air movement.  Other studies showed elevated white  count at 10.6.  Normal hemoglobin but low MCV at 74.  Normal electrolytes  except for a low potassium of 3.1.  Normal renal function.  Patient was  admitted to the intensive care unit, and she was treated with Xopenex  nebulizers back to back for one hour continuously in addition to IV steroids  and Atrovent nebulizers.  Her ABG initially revealed a pO2 in the  70s with  a pCO2 of 37.  We repeated the course in one hour of Atrovent and albuterol  nebulizers.  I had to use Diltiazem drip to control her heart rate and blood  pressure.  Meanwhile, acute ABG showed improvement in the pCO2 to 33 and  improvement in pO2 to 80, with 3 liters of oxygen.  We continued with  intensive nebulizer treatment every 2 hours and every 4 hours.  Finally,  after about 24 hours in the unit, the patient's asthma attack started  breaking.  The patient started to breathe better, and her wheezing started  to go down.  Her ABG showed major improvement with pO2 of 100, and pCO2 of  33.  After two more days of intensive treatment and monitoring, the patient  was felt to be safe to be switched to oral steroids and discharged as such.   During the hospitalization, complications we needed to deal with included  hypokalemia, which was felt to be albuterol-induced and treated with  supplements.  Tachycardia, which was treated initially with ___________ drip  and then resolved after the dose of albuterol decreased.  Microcytosis was  worked up with iron studies; however, they were all normal.  Was felt to be  probably secondary to thalassemia trait.  Blood pressure similar to  tachycardia induced by albuterol and resolved once the dose was reduced.  Finally, the patient had steroid-induced hyperglycemia for which she was put  on a sliding scale of regular insulin and  once-a-day Lantus insulin.  This  problem resolved with tapering of the steroids, so she will not need to  ___________.   No procedures.  No consultations.  The patient is to call Dr. Posey Boyer  office for followup appointment.  The primary care doctor is Dr. Leanna Battles.                                               Renato Battles, M.D.    SA/MEDQ  D:  06/21/2003  T:  06/22/2003  Job:  [id number]

## 2010-07-21 ENCOUNTER — Encounter: Payer: Self-pay | Admitting: Family Medicine

## 2010-07-21 DIAGNOSIS — J309 Allergic rhinitis, unspecified: Secondary | ICD-10-CM

## 2010-07-21 DIAGNOSIS — R011 Cardiac murmur, unspecified: Secondary | ICD-10-CM

## 2010-07-21 DIAGNOSIS — E249 Cushing's syndrome, unspecified: Secondary | ICD-10-CM

## 2010-07-21 DIAGNOSIS — E119 Type 2 diabetes mellitus without complications: Secondary | ICD-10-CM | POA: Insufficient documentation

## 2010-07-21 DIAGNOSIS — J45909 Unspecified asthma, uncomplicated: Secondary | ICD-10-CM | POA: Insufficient documentation

## 2010-07-21 DIAGNOSIS — I10 Essential (primary) hypertension: Secondary | ICD-10-CM | POA: Insufficient documentation

## 2010-07-21 DIAGNOSIS — E785 Hyperlipidemia, unspecified: Secondary | ICD-10-CM | POA: Insufficient documentation

## 2010-08-07 ENCOUNTER — Encounter: Payer: Federal, State, Local not specified - PPO | Admitting: Family Medicine

## 2010-09-21 ENCOUNTER — Other Ambulatory Visit (HOSPITAL_COMMUNITY)
Admission: RE | Admit: 2010-09-21 | Discharge: 2010-09-21 | Disposition: A | Discharge status: Home / Self Care | Payer: Federal, State, Local not specified - PPO | Source: Ambulatory Visit | Attending: Family Medicine | Admitting: Family Medicine

## 2010-09-21 ENCOUNTER — Ambulatory Visit (INDEPENDENT_AMBULATORY_CARE_PROVIDER_SITE_OTHER): Payer: Federal, State, Local not specified - PPO | Admitting: Family Medicine

## 2010-09-21 ENCOUNTER — Encounter: Payer: Self-pay | Admitting: Family Medicine

## 2010-09-21 VITALS — BP 130/92 | HR 88 | Ht 64.0 in | Wt 213.0 lb

## 2010-09-21 DIAGNOSIS — Z0000 Encounter for general adult medical examination without abnormal findings: Secondary | ICD-10-CM

## 2010-09-21 DIAGNOSIS — E7800 Pure hypercholesterolemia, unspecified: Secondary | ICD-10-CM

## 2010-09-21 DIAGNOSIS — Z01419 Encounter for gynecological examination (general) (routine) without abnormal findings: Secondary | ICD-10-CM | POA: Insufficient documentation

## 2010-09-21 DIAGNOSIS — E119 Type 2 diabetes mellitus without complications: Secondary | ICD-10-CM

## 2010-09-21 DIAGNOSIS — R5381 Other malaise: Secondary | ICD-10-CM

## 2010-09-21 DIAGNOSIS — Z23 Encounter for immunization: Secondary | ICD-10-CM

## 2010-09-21 LAB — POCT URINALYSIS DIPSTICK
Bilirubin, UA: NEGATIVE
Blood, UA: NEGATIVE
Leukocytes, UA: NEGATIVE
Spec Grav, UA: 1.015
pH, UA: 5

## 2010-09-21 LAB — POCT GLYCOSYLATED HEMOGLOBIN (HGB A1C): Hemoglobin A1C: 6.1

## 2010-09-21 NOTE — Progress Notes (Signed)
[name] is a 45 y.o. female who presents for a complete physical.  She has the following concerns:  Stressing out this week, getting her [name] situated at college.  Doesn't check her blood pressure elsewhere.  In the past had h/o HTN treated with Benicar HCT.  DM--got off meds with diet and exercise.  Previously took Metformin.  Sugars run between 80-100. Past due for eye exam--plans to schedule  Hyperlipidemia.  Previously took Pravastatin.  Has been on the red yeast rice in its place since March.  Had myalgias and joint aches from the pravastatin, so stopped the pravastatin   Immunization History  Administered Date(s) Administered  . Influenza Whole 12/13/2008  . Td 10/29/2000  Gets flu shots at work Last Pap smear: 5/09 Last mammogram: age 45 Last colonoscopy: never Last DEXA: never Dentist: years ago Ophtho: yearly, but is overdue now--plans to schedule Exercise:  3x/week for 45 minutes  Past Medical History  Diagnosis Date  . Asthma   . Hypertension   . Diabetes mellitus   . Heart murmur     mild TR  . Hyperlipidemia   . Cushing syndrome     ?-pt states she was given this diagnosis during a hospitalization.  Records reviewed, no mention of dx or treatment    Past Surgical History  Procedure Date  . Tubal ligation 01/10/2000  . Cervical conization w/bx 1999    History   Social History  . Marital Status: Married    Spouse Name: N/A    Number of Children: 3  . Years of Education: N/A   Occupational History  . data collection [employer]   Social History Main Topics  . Smoking status: Current Everyday Smoker -- 0.2 packs/day    Types: Cigarettes  . Smokeless tobacco: Never Used  . Alcohol Use: No  . Drug Use: No  . Sexually Active: Yes    Birth Control/ Protection: Surgical   Other Topics Concern  . Not on file   Social History Narrative  . No narrative on file    Family History  Problem Relation Age of Onset  . Thyroid disease  [name]   . Hyperlipidemia [name]   . Heart murmur [name]   . Heart murmur [name]   . Diabetes Neg Hx   . Heart murmur [name]   . Asthma [name]   . Cancer [name] 57    female [name]--unknown primary, metastatic    Current outpatient prescriptions:budesonide-formoterol (SYMBICORT) 160-4.5 MCG/ACT inhaler, Inhale 2 puffs into the lungs 2 (two) times daily.  , Disp: , Rfl: ;  Red Yeast Rice 600 MG CAPS, Take 2 capsules by mouth daily.  , Disp: , Rfl: ;  albuterol (PROVENTIL HFA) 108 (90 BASE) MCG/ACT inhaler, Inhale 2 puffs into the lungs every 6 (six) hours as needed.  , Disp: , Rfl:   Allergies  Allergen Reactions  . Rocephin (Ceftriaxone Sodium) Hives, Itching and Other (See Comments)    wheezing    ROS: The patient denies anorexia, fever, weight changes, headaches,  vision changes, decreased hearing, ear pain, sore throat, breast concerns, chest pain, palpitations, dizziness, syncope, dyspnea on exertion, cough, swelling, nausea, vomiting, diarrhea, constipation, abdominal pain, melena, hematochezia, indigestion/heartburn, hematuria, incontinence, dysuria, irregular menstrual cycles, vaginal discharge, odor or itch, genital lesions, joint pains, numbness, tingling, weakness, tremor, suspicious skin lesions, depression, anxiety, abnormal bleeding/bruising, or enlarged lymph nodes.  Periods are heavy, and gets back pain with cycles; pain is relieved by NSAIDs  PHYSICAL EXAM: BP  130/92  Pulse 88  Ht 5\' 4"  (1.626 m)  Wt 213 lb (96.616 kg)  BMI 36.56 kg/m2  LMP 08/23/2010  General Appearance:    Alert, cooperative, no distress, appears stated age  Head:    Normocephalic, without obvious abnormality, atraumatic  Eyes:    PERRL, conjunctiva/corneas clear, EOM's intact, fundi    benign  Ears:    Normal TM's and external ear canals  Nose:   Nares normal, mucosa normal, no drainage or sinus   tenderness  Throat:   Lips, mucosa, and tongue normal; teeth and gums normal  Neck:   Supple,  no lymphadenopathy;  thyroid:  no   enlargement/tenderness/nodules; no carotid   bruit or JVD  Back:    Spine nontender, no curvature, ROM normal, no CVA     tenderness  Lungs:     Clear to auscultation bilaterally without wheezes, rales or     ronchi; respirations unlabored  Chest Wall:    No tenderness or deformity   Heart:    Regular rate and rhythm, S1 and S2 normal, no murmur, rub   or gallop  Breast Exam:    No tenderness, masses, or nipple discharge or inversion.      No axillary lymphadenopathy. Excess axillary (fatty) tissue bilaterally  Abdomen:     Soft, non-tender, nondistended, normoactive bowel sounds,    no masses, no hepatosplenomegaly  Genitalia:    Normal external genitalia without lesions.  BUS and vagina normal; cervix without lesions, or cervical motion tenderness. No abnormal vaginal discharge.  Uterus and adnexa not enlarged, nontender, no masses.  Pap performed  Rectal:    Normal tone, no masses or tenderness; guaiac negative stool  Extremities:   No clubbing, cyanosis or edema  Pulses:   2+ and symmetric all extremities  Skin:   Skin color, texture, turgor normal, no rashes or lesions  Lymph nodes:   Cervical, supraclavicular, and axillary nodes normal  Neurologic:   CNII-XII intact, normal strength, sensation and gait; reflexes 1+ and symmetric throughout          Psych:   Normal mood, affect, hygiene and grooming.    ASSESSMENT/PLAN: 1. Routine general medical examination at a health care facility  POCT Urinalysis Dipstick, Visual acuity screening, Tdap vaccine greater than or equal to 7yo IM, Cytology - PAP  2. Type II or unspecified type diabetes mellitus without mention of complication, not stated as uncontrolled  POCT HgB A1C, Comprehensive metabolic panel, Microalbumin / creatinine urine ratio  3. Pure hypercholesterolemia  Lipid panel  4. Other malaise and fatigue  CBC with Differential, TSH, Vitamin D 25 hydroxy    Discussed monthly self breast exams and  yearly mammograms after the age of 54; at least 30 minutes of aerobic activity at least 5 days/week; proper sunscreen use reviewed; healthy diet, including goals of calcium and vitamin D intake and alcohol recommendations (less than or equal to 1 drink/day) reviewed; regular seatbelt use; changing batteries in smoke detectors.  Immunization recommendations discussed--TdaP given today, yearly flu shots recommended.  Colonoscopy recommendations reviewed--at age 81  Advised to periodically check BP at pharmacy, goal BP <130/80

## 2010-09-21 NOTE — Patient Instructions (Addendum)
HEALTH MAINTENANCE RECOMMENDATIONS:  It is recommended that you get at least 30 minutes of aerobic exercise at least 5 days/week (for weight loss, you may need as much as 60-90 minutes). This can be any activity that gets your heart rate up. This can be divided in 10-15 minute intervals if needed, but try and build up your endurance at least once a week.  Weight bearing exercise is also recommended twice weekly.  Eat a healthy diet with lots of vegetables, fruits and fiber.  "Colorful" foods have a lot of vitamins (ie green vegetables, tomatoes, red peppers, etc).  Limit sweet tea, regular sodas and alcoholic beverages, all of which has a lot of calories and sugar.  Up to 1 alcoholic drink daily may be beneficial for women (unless trying to lose weight, watch sugars).  Drink a lot of water.  Calcium recommendations are 1200-1500 mg daily (1500 mg for postmenopausal women or women without ovaries), and vitamin D 1000 IU daily.  This should be obtained from diet and/or supplements (vitamins), and calcium should not be taken all at once, but in divided doses.  Monthly self breast exams and yearly mammograms for women over the age of 74 is recommended.  Sunscreen of at least SPF 30 should be used on all sun-exposed parts of the skin when outside between the hours of 10 am and 4 pm (not just when at beach or pool, but even with exercise, golf, tennis, and yard work!)  Use a sunscreen that says "broad spectrum" so it covers both UVA and UVB rays, and make sure to reapply every 1-2 hours.  Remember to change the batteries in your smoke detectors when changing your clock times in the spring and fall.  Use your seat belt every time you are in a car, and please drive safely and not be distracted with cell phones and texting while driving.   Check your blood pressure periodically at pharmacy.  Goal BP is <130/80

## 2010-09-22 ENCOUNTER — Encounter: Payer: Self-pay | Admitting: Family Medicine

## 2010-09-22 DIAGNOSIS — E119 Type 2 diabetes mellitus without complications: Secondary | ICD-10-CM | POA: Insufficient documentation

## 2010-09-22 DIAGNOSIS — E7800 Pure hypercholesterolemia, unspecified: Secondary | ICD-10-CM | POA: Insufficient documentation

## 2010-09-22 LAB — MICROALBUMIN / CREATININE URINE RATIO
Creatinine, Urine: 213.9 mg/dL
Microalb Creat Ratio: 7.5 mg/g (ref 0.0–30.0)
Microalb, Ur: 1.6 mg/dL (ref 0.00–1.89)

## 2010-09-25 ENCOUNTER — Encounter: Payer: Self-pay | Admitting: Family Medicine

## 2010-09-26 ENCOUNTER — Other Ambulatory Visit: Payer: Federal, State, Local not specified - PPO

## 2010-11-16 LAB — CBC
HCT: 37
HCT: 37.3
Hemoglobin: 13.5
MCHC: 33.1
MCHC: 33.2
MCHC: 33.3
MCV: 74.6 — ABNORMAL LOW
MCV: 75 — ABNORMAL LOW
Platelets: 315
RBC: 4.93
RBC: 5.49 — ABNORMAL HIGH
RDW: 15.1 — ABNORMAL HIGH
WBC: 17.1 — ABNORMAL HIGH
WBC: 18 — ABNORMAL HIGH
WBC: 18.9 — ABNORMAL HIGH

## 2010-11-16 LAB — BASIC METABOLIC PANEL
BUN: 6
CO2: 23
CO2: 27
Calcium: 8.9
Calcium: 9.1
Chloride: 107
Creatinine, Ser: 0.8
GFR calc Af Amer: >60
GFR calc non Af Amer: >60
GFR calc non Af Amer: >60
Glucose, Bld: 169 — ABNORMAL HIGH
Potassium: 3.4 — ABNORMAL LOW
Sodium: 137

## 2010-11-16 LAB — DIFFERENTIAL
Basophils Relative: 0
Basophils Relative: 1
Eosinophils Absolute: 0
Eosinophils Absolute: 0
Eosinophils Relative: 0
Eosinophils Relative: 0
Lymphocytes Relative: 8 — ABNORMAL LOW
Lymphs Abs: 1
Lymphs Abs: 1.5
Monocytes Absolute: 0.2
Neutrophils Relative %: 87 — ABNORMAL HIGH

## 2010-11-16 LAB — COMPREHENSIVE METABOLIC PANEL
AST: 38 — ABNORMAL HIGH
Albumin: 3.7
BUN: 10
CO2: 23
Creatinine, Ser: 0.78
GFR calc Af Amer: >60
Glucose, Bld: 116 — ABNORMAL HIGH
Potassium: 3.3 — ABNORMAL LOW
Total Bilirubin: 0.6

## 2010-11-16 LAB — MAGNESIUM: Magnesium: 1.9

## 2010-11-16 LAB — TSH: TSH: 0.822

## 2011-02-02 ENCOUNTER — Other Ambulatory Visit: Payer: Self-pay | Admitting: Family Medicine

## 2011-06-05 ENCOUNTER — Telehealth: Payer: Self-pay | Admitting: Internal Medicine

## 2011-06-05 ENCOUNTER — Ambulatory Visit (INDEPENDENT_AMBULATORY_CARE_PROVIDER_SITE_OTHER): Payer: Federal, State, Local not specified - PPO | Admitting: Medical

## 2011-06-05 ENCOUNTER — Encounter: Payer: Self-pay | Admitting: Medical

## 2011-06-05 VITALS — BP 160/100 | HR 90 | Temp 98.2°F | Resp 16 | Wt 222.0 lb

## 2011-06-05 DIAGNOSIS — R631 Polydipsia: Secondary | ICD-10-CM

## 2011-06-05 DIAGNOSIS — R202 Paresthesia of skin: Secondary | ICD-10-CM

## 2011-06-05 DIAGNOSIS — R209 Unspecified disturbances of skin sensation: Secondary | ICD-10-CM

## 2011-06-05 DIAGNOSIS — J45909 Unspecified asthma, uncomplicated: Secondary | ICD-10-CM

## 2011-06-05 DIAGNOSIS — E119 Type 2 diabetes mellitus without complications: Secondary | ICD-10-CM | POA: Insufficient documentation

## 2011-06-05 DIAGNOSIS — I10 Essential (primary) hypertension: Secondary | ICD-10-CM

## 2011-06-05 LAB — POCT URINALYSIS DIPSTICK
Glucose, UA: NEGATIVE
Leukocytes, UA: NEGATIVE
Nitrite, UA: NEGATIVE
Urobilinogen, UA: NEGATIVE

## 2011-06-05 LAB — COMPREHENSIVE METABOLIC PANEL
Albumin: 4.1 g/dL (ref 3.5–5.2)
BUN: 9 mg/dL (ref 6–23)
CO2: 25 mEq/L (ref 19–32)
Calcium: 9 mg/dL (ref 8.4–10.5)
Glucose, Bld: 100 mg/dL — ABNORMAL HIGH (ref 70–99)
Potassium: 3.8 mEq/L (ref 3.5–5.3)
Sodium: 139 mEq/L (ref 135–145)
Total Protein: 6.7 g/dL (ref 6.0–8.3)

## 2011-06-05 MED ORDER — OLMESARTAN MEDOXOMIL-HCTZ 20-12.5 MG PO TABS: 1.0000 | ORAL_TABLET | Freq: Every day | ORAL | Status: DC

## 2011-06-05 MED ORDER — GLUCOSE BLOOD VI STRP: 1.0000 | ORAL_STRIP | Freq: Two times a day (BID) | Status: DC

## 2011-06-05 MED ORDER — ALBUTEROL SULFATE HFA 108 (90 BASE) MCG/ACT IN AERS: 2.0000 | INHALATION_SPRAY | Freq: Four times a day (QID) | RESPIRATORY_TRACT | Status: DC | PRN

## 2011-06-05 NOTE — Progress Notes (Addendum)
Subjective: Here for not feeling well.  She has a hx/o diabetes and HTN that have been controlled without medication for a while now.  Last medication for either 2010 according to her.    She notes she had been fine until yesterday, but last night at work felt "off", felt lightheaded, tingly and numb in general of chest and bilat arms, had some anxiety, thought it was either her sugar being off or anxiety coming on.  Had some palpitations, brief chest pain for a second.  She also has had some intermittent ankle swelling, last time this happened was 3-4 years ago.  She didn't have any glucometer strips to check glucose last evening.  She still had some of these symptoms this morning, thought it could be her blood pressure too, thus she came in for visit.  In general she hasn't checking glucose or BP at home.  She is a smoker.    LMP just finished.  Allergies  Allergen Reactions  . Rocephin (Ceftriaxone Sodium) Hives, Itching and Other (See Comments)    wheezing    Current Outpatient Prescriptions on File Prior to Visit  Medication Sig Dispense Refill  . glucose blood test strip 1 each by Other route 2 (two) times daily.  100 each  prn  . Red Yeast Rice 600 MG CAPS Take 2 capsules by mouth daily.        . SYMBICORT 160-4.5 MCG/ACT inhaler INHALE 2 PUFF AS DIRECTED TWICE A DAY  10.2 g  9  . DISCONTD: albuterol (PROVENTIL HFA) 108 (90 BASE) MCG/ACT inhaler Inhale 2 puffs into the lungs every 6 (six) hours as needed.        Marland Kitchen olmesartan-hydrochlorothiazide (BENICAR HCT) 20-12.5 MG per tablet Take 1 tablet by mouth daily.  30 tablet  0    Past Medical History  Diagnosis Date  . Asthma   . Hypertension   . Diabetes mellitus   . Heart murmur     mild TR  . Hyperlipidemia   . Cushing syndrome     ?-pt states she was given this diagnosis during a hospitalization.  Records reviewed, no mention of dx or treatment    Past Surgical History  Procedure Date  . Tubal ligation 01/10/2000  .  Cervical conization w/bx 1999    Family History  Problem Relation Age of Onset  . Thyroid disease Sister   . Hyperlipidemia Mother   . Heart murmur Daughter   . Heart murmur Son   . Diabetes Neg Hx   . Heart murmur Daughter   . Asthma Daughter   . Cancer Cousin 21    female cousin--unknown primary, metastatic    History   Social History  . Marital Status: Married    Spouse Name: N/A    Number of Children: 3  . Years of Education: N/A   Occupational History  . data collection [employer]   Social History Main Topics  . Smoking status: Current Everyday Smoker -- 0.2 packs/day    Types: Cigarettes  . Smokeless tobacco: Never Used  . Alcohol Use: No  . Drug Use: No  . Sexually Active: Yes    Birth Control/ Protection: Surgical   Other Topics Concern  . Not on file   Social History Narrative  . No narrative on file    Reviewed their medical, surgical, family, social, medication, and allergy history and updated chart as appropriate.  ROS as noted in HPI    Objective:   Physical Exam  Filed Vitals:   06/05/11 1352  BP: 160/100  Pulse: 90  Temp: 98.2 F (36.8 C)  Resp: 16    General appearance: alert, no distress, WD/WN HEENT: negative Eyes: no papilledema Oral cavity: MMM, no lesions Neck: supple, no lymphadenopathy, no thyromegaly, no masses, no bruits Heart: faint II/VI brief systolic murmur, heard best in bilat upper sternal borders, otherwise RRR, normal S2 Lungs: CTA bilaterally, no wheezes, rhonchi, or rales Abdomen: +bs, soft, non tender, non distended, no masses, no hepatomegaly, no splenomegaly Pulses: 2+ symmetric, upper and lower extremities, normal cap refill Ext: no edema Neuro: non focal, CN2-12 intact   Adult ECG Report  Indication: HTN, palpitations  Rate: 70 bpm  Rhythm: normal sinus rhythm  QRS Axis: 19 degrees  PR Interval:  QRS Duration: 86ms  QTc:  Conduction Disturbances: none  Other Abnormalities: none   Patient's cardiac risk factors are: diabetes mellitus, hypertension, obesity (BMI >= 30 kg/m2), sedentary lifestyle and smoking/ tobacco exposure.  EKG comparison: 10/2001   Narrative Interpretation: normal ECG, no acute change from prior    Assessment and Plan :    Encounter Diagnoses  Name Primary?  . Essential hypertension, benign Yes  . Polydipsia   . Paresthesia   . Diabetes mellitus without mention of complication   . Asthma    HTN - given her symptoms and exam, advised she restart her blood pressure medication as this is likely a major reason for the symptoms.  Restarted Benicar HCT.  She has gained 9lb since last visit a few months ago.  Advised she work on lifestyle changes again and try to get back off the 9+ lbs she gained back.  Advised she check her BP in 1wk.  If still having symptoms recheck, otherwise RTC 64mo.  Polydipsia - reviewed in house glucose and UA today.  Labs sent  paresthesia - likely related to elevated pressure and/or anxiety.  Discussed ways to deal with anxiety and similar symptoms.  Labs today  DM type II - HgbA1C today, in house glucose today unremarkable.  Asthma - controlled, refilled in haler today.  Follow-up pending labs.

## 2011-06-05 NOTE — Telephone Encounter (Signed)
Ordered test strips per pt request

## 2011-06-06 ENCOUNTER — Telehealth: Payer: Self-pay | Admitting: Family Medicine

## 2011-06-06 LAB — HEMOGLOBIN A1C: Hgb A1c MFr Bld: 6.1 % — ABNORMAL HIGH (ref <5.7)

## 2011-06-06 LAB — CBC WITH DIFFERENTIAL/PLATELET
Eosinophils Absolute: 0.2 10*3/uL (ref 0.0–0.7)
Hemoglobin: 11.4 g/dL — ABNORMAL LOW (ref 12.0–15.0)
Lymphocytes Relative: 37 % (ref 12–46)
Lymphs Abs: 2.9 10*3/uL (ref 0.7–4.0)
Monocytes Relative: 5 % (ref 3–12)
Neutro Abs: 4.5 10*3/uL (ref 1.7–7.7)
Neutrophils Relative %: 56 % (ref 43–77)
Platelets: 350 10*3/uL (ref 150–400)
RBC: 4.77 MIL/uL (ref 3.87–5.11)
WBC: 8 10*3/uL (ref 4.0–10.5)

## 2011-06-06 LAB — TSH: TSH: 2.086 u[IU]/mL (ref 0.350–4.500)

## 2011-06-06 NOTE — Telephone Encounter (Signed)
Message copied by Janeice Robinson on Wed Jun 06, 2011  3:53 PM ------      Message from: Aleen Campi, DAVID S      Created: Tue Jun 05, 2011 10:21 PM       pls call out glucometer testing supplies - lancets, strips, tests once daily, #100 with 11 refills

## 2011-06-06 NOTE — Telephone Encounter (Signed)
I CALLED OUT THE PATIENTS ONE TOUCH GLUCOMETER TEST STRIPS AND LANCETS TO HER PHARMACY PER DAVID TYSINGER PA-C. CLS

## 2011-06-13 ENCOUNTER — Telehealth: Payer: Self-pay | Admitting: Medical

## 2011-06-13 NOTE — Telephone Encounter (Signed)
Ok,noted

## 2011-06-13 NOTE — Telephone Encounter (Signed)
PT WANTED TO LET SHANE KNOW THAT SHE JUST FIGURED OUT THAT THE FIBER SUPPLEMENT THAT SHE MIXES WITH WATER CAUSED HER HIGH BP. SHE DID NOT DRINK IT SHE THE HIGH BP ISSUE UNTIL YESTERDAY/LAST NIGHT AND BP WENT UP. READ ABOUT SUPPLEMENT ONLINE AND SAW THAT PHENYLALANINE IN IN SUPPLEMENT AND WILL CAUSE HIGH BP. STOPPED USING IT IMMEDIATELY AND BP HAS BEEN NORMAL. BP WAS BOTTOMING OUT WHEN SHE WAS TAKING BP MEDS AND NOT USING SUPPLEMENT.

## 2011-06-13 NOTE — Telephone Encounter (Signed)
Tell thans for letting me know.  i would recommend nurse visit BP check in a week though so we can keep a watch on the BP.  So does this mean she is NOT taking her BP medication?

## 2011-06-13 NOTE — Telephone Encounter (Signed)
THE PATIENT STATES THAT SHE TAKES HER BP EVERY DAY. SHE TAKES HER CUFF TO WORK WITH HER. SHE SAID THE LAST TIME SHE TOOK THE MEDICATION WAS YESTERDAY AND SHE ONLY TOOK A HALF. SHE SAID HER BP READINGS FOR TODAY IS 118/72, 125/68 AND 120 74. SHE SAID FOR NOW SHE WILL NOT TAKE THE BP MEDICATION BUT IF ANYTHING CHANGES IN HER READINGS SHE WILL COME IN TO BE SEEN. CLS

## 2011-07-25 ENCOUNTER — Telehealth: Payer: Self-pay | Admitting: Internal Medicine

## 2011-07-25 MED ORDER — OLMESARTAN MEDOXOMIL-HCTZ 20-12.5 MG PO TABS: 1.0000 | ORAL_TABLET | Freq: Every day | ORAL | Status: DC

## 2011-07-25 NOTE — Telephone Encounter (Signed)
RX REFILL REQUEST WAS SENT INTO THE PHARMACY. CLS

## 2011-07-27 ENCOUNTER — Telehealth: Payer: Self-pay | Admitting: Internal Medicine

## 2011-07-27 MED ORDER — OLMESARTAN MEDOXOMIL-HCTZ 20-12.5 MG PO TABS: 1.0000 | ORAL_TABLET | Freq: Every day | ORAL | Status: DC

## 2011-07-27 NOTE — Telephone Encounter (Signed)
benicar was done as a refill sample instead of a normal route. Resent it as normal to pharmacy

## 2011-09-20 ENCOUNTER — Other Ambulatory Visit: Payer: Self-pay | Admitting: Family Medicine

## 2011-10-22 ENCOUNTER — Ambulatory Visit (INDEPENDENT_AMBULATORY_CARE_PROVIDER_SITE_OTHER): Payer: Federal, State, Local not specified - PPO | Admitting: Family Medicine

## 2011-10-22 VITALS — BP 130/82 | HR 102 | Temp 99.1°F | Wt 224.0 lb

## 2011-10-22 DIAGNOSIS — J029 Acute pharyngitis, unspecified: Secondary | ICD-10-CM

## 2011-10-22 DIAGNOSIS — E785 Hyperlipidemia, unspecified: Secondary | ICD-10-CM

## 2011-10-22 DIAGNOSIS — E119 Type 2 diabetes mellitus without complications: Secondary | ICD-10-CM

## 2011-10-22 LAB — LIPID PANEL
Cholesterol: 261 mg/dL — ABNORMAL HIGH (ref 0–200)
LDL Cholesterol: 172 mg/dL — ABNORMAL HIGH (ref 0–99)
Total CHOL/HDL Ratio: 5.6 Ratio
Triglycerides: 209 mg/dL — ABNORMAL HIGH (ref <150)
VLDL: 42 mg/dL — ABNORMAL HIGH (ref 0–40)

## 2011-10-22 MED ORDER — AMOXICILLIN 875 MG PO TABS: 875.0000 mg | ORAL_TABLET | Freq: Two times a day (BID) | ORAL | Status: AC

## 2011-10-22 NOTE — Progress Notes (Signed)
  Subjective:    Patient ID: [name], female    DOB: [birth year], 46 y.o.   MRN: [id number]  HPI About a month ago she noted sore pussy swollen tonsils. She did use gargles to help as well as a Netti pot. She did get better but continued to have her normal allergy symptoms of postnasal drainage. 3 days ago she noted worsening sore throat as well as difficulty singing. The symptoms have slowly worsened and now she is having a cough. Review of record indicates she did have elevated A1c. She did lose some weight and her A1c dropped down to 6.1. Her weight is now up slightly.   Review of Systems     Objective:   Physical Exam alert and in no distress. Tympanic membranes and canals are normal. Throat is clear. Tonsils are normal. Neck is supple without adenopathy or thyromegaly. Cardiac exam shows a regular sinus rhythm without murmurs or gallops. Lungs are clear to auscultation. Hemoglobin A1c is 6.5.       Assessment & Plan:   1. Sore throat  POCT rapid strep A, amoxicillin (AMOXIL) 875 MG tablet  2. Diabetes mellitus  HgB A1c, Lipid panel  3. Pharyngitis  amoxicillin (AMOXIL) 875 MG tablet  4. Hyperlipidemia LDL goal < 70     encouraged her to make further changes in her diet and exercise regimen. I will followup on her lipid panel. Since she's had symptoms for over a month, I will treat with Amoxil. Recheck here in several months.

## 2011-11-05 ENCOUNTER — Institutional Professional Consult (permissible substitution): Payer: Federal, State, Local not specified - PPO | Admitting: Family Medicine

## 2012-02-13 ENCOUNTER — Other Ambulatory Visit: Payer: Self-pay | Admitting: Family Medicine

## 2012-06-03 ENCOUNTER — Encounter: Payer: Self-pay | Admitting: Internal Medicine

## 2012-06-20 ENCOUNTER — Ambulatory Visit (INDEPENDENT_AMBULATORY_CARE_PROVIDER_SITE_OTHER): Payer: Federal, State, Local not specified - PPO | Admitting: Medical

## 2012-06-20 ENCOUNTER — Encounter: Payer: Self-pay | Admitting: Medical

## 2012-06-20 VITALS — BP 118/80 | HR 60 | Temp 98.1°F | Resp 16 | Wt 234.0 lb

## 2012-06-20 DIAGNOSIS — L03113 Cellulitis of right upper limb: Secondary | ICD-10-CM

## 2012-06-20 DIAGNOSIS — IMO0002 Reserved for concepts with insufficient information to code with codable children: Secondary | ICD-10-CM

## 2012-06-20 MED ORDER — HYDROCODONE-ACETAMINOPHEN 5-325 MG PO TABS: 1.0000 | ORAL_TABLET | Freq: Four times a day (QID) | ORAL | Status: DC | PRN

## 2012-06-20 MED ORDER — DOXYCYCLINE HYCLATE 100 MG PO TABS: 100.0000 mg | ORAL_TABLET | Freq: Two times a day (BID) | ORAL | Status: DC

## 2012-06-20 NOTE — Progress Notes (Signed)
Subjective: Here for skin lesion.   Felt prick of right arm Tuesday night.  Was reaching in container at work, when she pulled arm out, felt a prick.  Thought she may have cut arm, but didn't see wound or bleed at the time.  That night used antibiotic ointment.  Next bandaid had to use larger bandaid.  Over the last few days has bigger lesion, raised, tender, warm, red, come to a head.   Thinks maybe a spider bit her but not sure.   Now feels nauseated, fatigue, flushed feeling.    Past Medical History  Diagnosis Date  . Asthma   . Hypertension   . Diabetes mellitus   . Heart murmur     mild TR  . Hyperlipidemia   . Cushing syndrome     ?-pt states she was given this diagnosis during a hospitalization.  Records reviewed, no mention of dx or treatment   ROS as in subjective  Objective: Gen: wd, wn, nad Skin: 8.5 cm diameter area of erythema of right medial forearm with raised erythematous warm central area, no fluctuance yet, but indurated and tender MSK: tender over the skin lesion, tender wrist and forearm in general, normal right UE ROM, no other deformity Arm neurovascularly intact  Assessment: Encounter Diagnosis  Name Primary?  . Cellulitis of arm, right Yes   Plan: Begin doxycycline, warm compresses, Ibuprofen for pain and inflammation, Lortab prn worse pain.  If worsening over the weekend, go to the ED.

## 2012-06-20 NOTE — Patient Instructions (Signed)

## 2012-06-23 ENCOUNTER — Other Ambulatory Visit: Payer: Self-pay | Admitting: Medical

## 2012-06-23 ENCOUNTER — Telehealth: Payer: Self-pay | Admitting: Family Medicine

## 2012-06-23 MED ORDER — SULFAMETHOXAZOLE-TRIMETHOPRIM 800-160 MG PO TABS: 1.0000 | ORAL_TABLET | Freq: Two times a day (BID) | ORAL | Status: DC

## 2012-06-23 NOTE — Telephone Encounter (Signed)
Pt is having a terrible headache and she thinks it is from the doxycycline making her sick.  Can you call him something else to [facility] [address]  Also let pt know what to do about the doxy [phone].  Pt said her arm is getting much better and draining.

## 2012-06-23 NOTE — Telephone Encounter (Signed)
Take some Ibuprofen or Tylenol.  If the arm is improving, I hate to change antibiotic at this time

## 2012-06-23 NOTE — Telephone Encounter (Signed)
Patient states that she is unable to take the Doxycycline. She said that she is having a severe HA. I explain to [employer] PA-C what the patient said and he states that he would change her medication to Bactrim. I told [employer], PA-C changed her medication to Bactrim and she can pick it up at the pharmacy. CLS

## 2012-06-25 ENCOUNTER — Ambulatory Visit (INDEPENDENT_AMBULATORY_CARE_PROVIDER_SITE_OTHER): Payer: Federal, State, Local not specified - PPO | Admitting: Medical

## 2012-06-25 ENCOUNTER — Encounter: Payer: Self-pay | Admitting: Medical

## 2012-06-25 VITALS — BP 140/90 | HR 74 | Temp 98.4°F | Resp 16 | Wt 231.0 lb

## 2012-06-25 DIAGNOSIS — R220 Localized swelling, mass and lump, head: Secondary | ICD-10-CM

## 2012-06-25 DIAGNOSIS — K130 Diseases of lips: Secondary | ICD-10-CM

## 2012-06-25 DIAGNOSIS — T887XXA Unspecified adverse effect of drug or medicament, initial encounter: Secondary | ICD-10-CM

## 2012-06-25 DIAGNOSIS — IMO0002 Reserved for concepts with insufficient information to code with codable children: Secondary | ICD-10-CM

## 2012-06-25 DIAGNOSIS — T50905A Adverse effect of unspecified drugs, medicaments and biological substances, initial encounter: Secondary | ICD-10-CM

## 2012-06-25 DIAGNOSIS — L03113 Cellulitis of right upper limb: Secondary | ICD-10-CM

## 2012-06-25 NOTE — Progress Notes (Signed)
Subjective: Here for recheck on right forearm cellulitis.   She noticed much improvement on doxycycline right away, but had severe headache.  She called in about this, we switched to Bactrim.   She is on day 3 of Bactrim, the infection still is improving, but yesterday started getting a little itchy around the mount and mild lip swelling of left lower lip.  No rash.   She has no prior issues with Bactrim/sulfa.  She does still have some right arm discomfort in general.  overall much improved.  No fever, tongue swelling, rash, dyspnea.     Past Medical History  Diagnosis Date  . Asthma   . Hypertension   . Diabetes mellitus   . Heart murmur     mild TR  . Hyperlipidemia   . Cushing syndrome     ?-pt states she was given this diagnosis during a hospitalization.  Records reviewed, no mention of dx or treatment   ROS as in subjective  Objective: Gen: wd, wn, nad Skin: small 0.5cm indurated lesion now of right medial forearm. Currently no warmth, no erythema, no drainage.    MSK: mild tenderness over the skin lesion Arm neurovascularly intact  Assessment: Encounter Diagnoses  Name Primary?  . Cellulitis of arm, right Yes  . Adverse effect of drug/medicinal, initial encounter   . Lip swelling    Plan: Discussed her lip swelling, concerns, cellulitis.  Much improved, mostly resolved at this point.  I recommend we cautiously proceed with Bactrim but add Benadryl BID-TID, c/t warm compresses, elevation of arm.   Cautioned if any worsening swelling, rash or other allergy reaction to stop Bactrim immediately.  If tongue or facial swelling, or if worsening lip swelling, consider 911/ED eval.  We will move cautiously though.  She can call for any reason.  I updated her allergy list in general.  discussed hygiene, disinfecting surfaces at work and home, prevention.

## 2012-08-22 ENCOUNTER — Encounter: Payer: Self-pay | Admitting: Gynecology

## 2012-08-22 ENCOUNTER — Ambulatory Visit (INDEPENDENT_AMBULATORY_CARE_PROVIDER_SITE_OTHER): Payer: Federal, State, Local not specified - PPO | Admitting: Gynecology

## 2012-08-22 VITALS — BP 128/72 | HR 74 | Resp 12 | Ht 64.5 in | Wt 238.4 lb

## 2012-08-22 DIAGNOSIS — Z01419 Encounter for gynecological examination (general) (routine) without abnormal findings: Secondary | ICD-10-CM

## 2012-08-22 DIAGNOSIS — Z124 Encounter for screening for malignant neoplasm of cervix: Secondary | ICD-10-CM

## 2012-08-22 DIAGNOSIS — N920 Excessive and frequent menstruation with regular cycle: Secondary | ICD-10-CM

## 2012-08-22 DIAGNOSIS — Z0000 Encounter for general adult medical examination without abnormal findings: Secondary | ICD-10-CM

## 2012-08-22 LAB — POCT URINALYSIS DIPSTICK
Spec Grav, UA: 1.015
pH, UA: 5.5

## 2012-08-22 NOTE — Progress Notes (Signed)
47 y.o. @MARITALSTS  @African  American female   [phone] here for annual exam. Pt is currently sexually active.  Pt was diagnosed with CIS in 1999 had [facility] at [facility].  Pt reports variable cycles 17-29d between flow.  Pt reports that this last cycle she was bedridden.  Can flow 6-7d, no clots, will change every2h on heaviest days, will use 2+ boxes of pads/cycle.  No post-coital bleeding.  Pt reports 2 episodes of "stress" bleeding both with 5d of flow.  Unexplained weight gain 15# over 1y.  Patient's last menstrual period was 08/11/2012.          Sexually active: yes  The current method of family planning is tubal ligation.    Exercising: no  The patient does not participate in regular exercise at present. Last pap:  2012 Alcohol:  no Tobacco:  no BSE:  no    Health Maintenance  Topic Date Due  . Influenza Vaccine  10/06/2012  . Pap Smear  09/20/2013  . Tetanus/tdap  09/20/2020    Family History  Problem Relation Age of Onset  . Thyroid disease Sister   . Hyperlipidemia Mother   . Heart murmur Daughter   . Heart murmur Son   . Diabetes Neg Hx   . Heart murmur Daughter   . Asthma Daughter   . Cancer Cousin 61    female cousin--unknown primary, metastatic    Patient Active Problem List   Diagnosis Date Noted  . Essential hypertension, benign 06/05/2011  . Diabetes mellitus without mention of complication 06/05/2011  . Type II or unspecified type diabetes mellitus without mention of complication, not stated as uncontrolled 09/22/2010  . Asthma 07/21/2010  . HTN (hypertension) 07/21/2010  . Heart murmur 07/21/2010  . Cushing's syndrome 07/21/2010  . Diabetes mellitus 07/21/2010  . Hyperlipidemia 07/21/2010  . Rhinitis, allergic 07/21/2010    Past Medical History  Diagnosis Date  . Asthma   . Hypertension   . Diabetes mellitus   . Heart murmur     mild TR  . Hyperlipidemia   . Cushing syndrome     ?-pt states she was given this diagnosis during a  hospitalization.  Records reviewed, no mention of dx or treatment    Past Surgical History  Procedure Laterality Date  . Tubal ligation  01/10/2000  . Cervical conization w/bx  1999    Allergies: Bactrim; Doxycycline; and Rocephin  Current Outpatient Prescriptions  Medication Sig Dispense Refill  . albuterol (PROVENTIL HFA) 108 (90 BASE) MCG/ACT inhaler Inhale 2 puffs into the lungs every 6 (six) hours as needed.  1 Inhaler  1  . Red Yeast Rice 600 MG CAPS Take 2 capsules by mouth daily.        . SYMBICORT 160-4.5 MCG/ACT inhaler INHALE 2 PUFFS AS DIRECTED TWICE A DAY  10.2 g  8   No current facility-administered medications for this visit.    ROS: Pertinent items are noted in HPI.  Exam:    BP 128/72  Pulse 74  Resp 12  Ht 5' 4.5" (1.638 m)  Wt 238 lb 6.4 oz (108.138 kg)  BMI 40.3 kg/m2  LMP 08/11/2012 Weight change: @WEIGHTCHANGE @ Last 3 height recordings:  Ht Readings from Last 3 Encounters:  08/22/12 5' 4.5" (1.638 m)  09/21/10 5\' 4"  (1.626 m)   General appearance: alert, cooperative and appears stated age Head: Normocephalic, without obvious abnormality, atraumatic Neck: no adenopathy, no carotid bruit, no JVD, supple, symmetrical, trachea midline and thyroid not enlarged, symmetric, no tenderness/mass/nodules  Lungs: clear to auscultation bilaterally Breasts: normal appearance, no masses or tenderness Heart: regular rate and rhythm, S1, S2 normal, no murmur, click, rub or gallop Abdomen: soft, non-tender; bowel sounds normal; no masses,  no organomegaly Extremities: extremities normal, atraumatic, no cyanosis or edema Skin: Skin color, texture, turgor normal. No rashes or lesions Lymph nodes: Cervical, supraclavicular, and axillary nodes normal. no inguinal nodes palpated Neurologic: Grossly normal   Pelvic: External genitalia:  Excoriations consistnet with pad use              Urethra: normal appearing urethra with no masses, tenderness or lesions               Bartholins and Skenes: normal                 Vagina: normal appearing vagina with normal color and discharge, no lesions              Cervix: normal appearance              Pap taken: yes        Bimanual Exam:  Uterus:  uterus is normal size, shape, consistency and nontender                                      Adnexa:    normal adnexa in size, nontender and no masses                                      Rectovaginal: Confirms                                      Anus:  normal sphincter tone, no lesions  A: well woman menorrhagia    P: mammogram, consider 3D pap smear, will get records from [facility] PUS, EMB, TSH for menorrhagia A&D with zinc for rash return annually or prn   An After Visit Summary was printed and given to the patient.

## 2012-08-22 NOTE — Patient Instructions (Signed)

## 2012-08-26 ENCOUNTER — Telehealth: Payer: Self-pay | Admitting: *Deleted

## 2012-08-26 NOTE — Telephone Encounter (Signed)
Left Message To Call Back  

## 2012-08-26 NOTE — Telephone Encounter (Signed)
Message copied by Lorraine Lax on Tue Aug 26, 2012  9:51 AM ------      Message from: Douglass Rivers      Created: Sun Aug 24, 2012 11:06 AM       TSH normal, hb a little low, recommend starting iron twice a day ------

## 2012-08-26 NOTE — Telephone Encounter (Signed)
Patient notified (See Labs)

## 2012-08-28 ENCOUNTER — Telehealth: Payer: Self-pay | Admitting: *Deleted

## 2012-08-28 NOTE — Telephone Encounter (Signed)
Pt returning phone call regarding results.

## 2012-08-28 NOTE — Telephone Encounter (Signed)
Left message on CB#VM of need to call our office for test results.

## 2012-08-28 NOTE — Telephone Encounter (Signed)
Message copied by Elnora Morrison on Thu Aug 28, 2012  9:12 AM ------      Message from: Lorraine Lax      Created: Thu Aug 28, 2012  8:52 AM       Routed to triage ------

## 2012-08-29 ENCOUNTER — Telehealth: Payer: Self-pay | Admitting: *Deleted

## 2012-08-29 MED ORDER — METRONIDAZOLE 500 MG PO TABS: ORAL_TABLET | ORAL | Status: DC

## 2012-08-29 MED ORDER — METOCLOPRAMIDE HCL 10 MG PO TABS: 10.0000 mg | ORAL_TABLET | Freq: Four times a day (QID) | ORAL | Status: DC | PRN

## 2012-08-29 NOTE — Telephone Encounter (Signed)
Called in Flagyl 500 MG #4/0rf's to [facility] [address]. original rx printed. Per Dr. Farrel Gobble

## 2012-08-29 NOTE — Telephone Encounter (Signed)
Patient returned call . See lab resutls documentation.

## 2012-08-29 NOTE — Telephone Encounter (Signed)
See result note documentation . [facility] today,07/25/, cocerning lab results from Dr. Farrel Gobble.

## 2012-08-29 NOTE — Telephone Encounter (Signed)
Message copied by Elnora Morrison on Fri Aug 29, 2012  8:49 AM ------      Message from: Lorraine Lax      Created: Thu Aug 28, 2012  8:52 AM       Routed to triage ------

## 2012-08-29 NOTE — Telephone Encounter (Signed)
2nd attempt to  Call patient CB# . Left message on CB#VM of need to return call for lab resutls and instructions per Dr. Farrel Gobble.  Medication sent to pharmacy listed in patient chart EPIC.

## 2012-09-01 ENCOUNTER — Telehealth: Payer: Self-pay | Admitting: Gynecology

## 2012-09-01 NOTE — Telephone Encounter (Signed)
LMTCB to discuss insurance benefits for pus/endo bx and to schedule.  °

## 2012-09-10 NOTE — Telephone Encounter (Signed)
LMTCB to discuss ins benefits and schedule pus/endo bx.  °

## 2012-09-18 ENCOUNTER — Ambulatory Visit (INDEPENDENT_AMBULATORY_CARE_PROVIDER_SITE_OTHER): Payer: Federal, State, Local not specified - PPO | Admitting: Gynecology

## 2012-09-18 ENCOUNTER — Encounter: Payer: Self-pay | Admitting: Gynecology

## 2012-09-18 VITALS — BP 130/80 | HR 70 | Resp 18 | Ht 64.0 in | Wt 238.0 lb

## 2012-09-18 DIAGNOSIS — Z8249 Family history of ischemic heart disease and other diseases of the circulatory system: Secondary | ICD-10-CM

## 2012-09-18 DIAGNOSIS — N920 Excessive and frequent menstruation with regular cycle: Secondary | ICD-10-CM

## 2012-09-18 DIAGNOSIS — A599 Trichomoniasis, unspecified: Secondary | ICD-10-CM

## 2012-09-18 LAB — POCT WET PREP (WET MOUNT)

## 2012-09-18 NOTE — Progress Notes (Signed)
Subjective:     Patient ID: [name], female   DOB: [birth year], 47 y.o.   MRN: [id number]  HPI Comments: Pt here for test of cure for trich, tolerated rx without difficulty, not sexually active since.  Husband to be treated, she reports history of trich in pat that only she was treated for. Of note, pt's daughter Halima Fogal recently diagnosed with BL PE, felt to be related to ocp use, pt without history of DVT, is to return to office for EMB and PUS    Review of Systems  All other systems reviewed and are negative.       Objective:   Physical Exam  Constitutional: She is oriented to person, place, and time. She appears well-developed and well-nourished.  Neurological: She is alert and oriented to person, place, and time.  Pelvic exam: VULVA: normal appearing vulva with no masses, tenderness or lesions,  VAGINA: normal appearing vagina with normal color and discharge, no lesions,  CERVIX: normal appearing cervix without discharge or lesions,  UTERUS: irregular,  ADNEXA: no masses.       Assessment:     Test of cure for trich     Plan:     Wet prep negative, pt assured, stress spouse gets treated and not tested History of DVT in daughter, if pt needs surgery, will confirm pt has no underlying pathology Recommend pt and her other daughter get evaluated for hereditary coagulopathy.  Long discussion regarding familial coagulopathy and tests required and treatments. Length of time spend discussing coagulopathy >40m

## 2012-09-18 NOTE — Patient Instructions (Signed)
Deep Vein Thrombosis A deep vein thrombosis (DVT) is a blood clot that develops in a deep vein. A DVT is a clot in the deep, larger veins of the leg, arm, or pelvis. These are more dangerous than clots that might form in veins near the surface of the body. A DVT can lead to complications if the clot breaks off and travels in the bloodstream to the lungs.  A DVT can damage the valves in your leg veins, so that instead of flowing upwards, the blood pools in the lower leg. This is called post-thrombotic syndrome, and can result in pain, swelling, discoloration, and sores on the leg. Once identified, a DVT can be treated. It can also be prevented in some circumstances. Once you have had a DVT, you may be at increased risk for a DVT in the future. CAUSES Blood clots form in a vein for different reasons. Usually several things contribute to blood clots. Contributing factors include:  The flow of blood slows down.  The inside of the vein is damaged in some way.  The person has a condition that makes blood clot more easily. Some people are more likely than others to develop blood clots. That is because they have more factors that make clots likely. These are called risk factors. Risk factors include:   Older age, especially over 75 years old.  Having a history of blood clots. This means you have had one before. Or, it means that someone else in your family has had blood clots. You may have a genetic tendency to form clots.  Having major or lengthy surgery. This is especially true for surgery on the hip, knee, or belly (abdomen). Hip surgery is particularly high risk.  Breaking a hip or leg.  Sitting or lying still for a long time. This includes long distance travel, paralysis, or recovery from an illness or surgery.  Cancer, or cancer treatment.  Having a long, thin tube (catheter) placed inside a vein during a medical procedure.  Being overweight (obese).  Pregnancy and childbirth. Hormone  changes make the blood clot more easily during pregnancy. The fetus puts pressure on the veins of the pelvis. There is also risk of injury to veins during delivery or a caesarean. The risk is at its highest just after childbirth.  Medicines with the female hormone estrogen. This includes birth control pills and hormone replacement therapy.  Smoking.  Other circulation or heart problems. SYMPTOMS When a clot forms, it can either partially or totally block the blood flow in that vein. Symptoms of a DVT can include:  Swelling of the leg or arm, especially if one side is much worse.  Warmth and redness of the leg or arm, especially if one side is much worse.  Pain in an arm or leg. If the clot is in the leg, symptoms may be more noticeable or worse when standing or walking. The symptoms of a DVT that has traveled to the lungs (pulmonary embolism, PE) usually start suddenly, and include:  Shortness of breath.  Coughing.  Coughing up blood or blood-tinged phlegm.  Chest pain. The chest pain is often worse with deep breaths.  Rapid heartbeat. Anyone with these symptoms should get emergency medical treatment right away. Call your local emergency services (911 in [place] if you have these symptoms. DIAGNOSIS If a DVT is suspected, your caregiver will take a full medical history and carry out a physical exam. Tests that also may be required include:  Blood tests, including studies of   the clotting properties of the blood.  Ultrasonography to see if you have clots in your legs or lungs.  X-rays to show the flow of blood when dye is injected into the veins (venography).  Studies of your lungs, if you have any chest symptoms. PREVENTION  Exercise the legs regularly. Take a brisk 30 minute walk every day.  Maintain a weight that is appropriate for your height.  Avoid sitting or lying in bed for long periods of time without moving your legs.  Women, particularly those over the age of 35,  should consider the risks and benefits of taking estrogen medicines, including birth control pills.  Do not smoke, especially if you take estrogen medicines.  Long distance travel can increase your risk of DVT. You should exercise your legs by walking or pumping the muscles every hour.  In-hospital prevention:  Many of the risk factors above relate to situations that exist with hospitalization, either for illness, injury, or elective surgery.  Your caregiver will assess you for the need for venous thromboembolism prophylaxis when you are admitted to the hospital. If you are having surgery, your surgeon will assess you the day of or day after surgery.  Prevention may include medical and nonmedical measures. TREATMENT Treatment for DVT helps prevent death and disability. The most common treatment for DVT is blood thinning (anticoagulant) medicine, which reduces the blood's tendency to clot. Anticoagulants can stop new blood clots from forming and old ones from growing. They cannot dissolve existing clots. Your body does this by itself over time. Anticoagulants can be given by mouth, by intravenous (IV) access, or by injection. Your caregiver will determine the best program for you.  Heparin or related medicines (low molecular weight heparin) are usually the first treatment for a blood clot. They act quickly. However, they cannot be taken orally.  Heparin can cause a fall in a component of blood that stops bleeding and forms blood clots (platelets). You will be monitored with blood tests to be sure this does not occur.  Warfarin is an anticoagulant that can be swallowed (taken orally). It takes a few days to start working, so usually heparin or related medicines are used in combination. Once warfarin is working, heparin is usually stopped.  Less commonly, clot dissolving drugs (thrombolytics) are used to dissolve a DVT. They carry a high risk of bleeding, so they are used mainly in severe cases,  where a life or limb is threatened.  Very rarely, a blood clot in the leg needs to be removed surgically.  If you are unable to take anticoagulants, your caregiver may arrange for you to have a filter placed in a main vein in your belly (abdomen). This filter prevents clots from traveling to your lungs. HOME CARE INSTRUCTIONS  Take all medicines prescribed by your caregiver. Follow the directions carefully.  Warfarin. Most people will continue taking warfarin after hospital discharge. Your caregiver will advise you on the length of treatment (usually 3 6 months, sometimes lifelong).  Too much and too little warfarin are both dangerous. Too much warfarin increases the risk of bleeding. Too little warfarin continues to allow the risk for blood clots. While taking warfarin, you will need to have regular blood tests to measure your blood clotting time. These blood tests usually include both the prothrombin time (PT) and international normalized ratio (INR) tests. The PT and INR results allow your caregiver to adjust your dose of warfarin. The dose can change for many reasons. It is critically important that   you take warfarin exactly as prescribed, and that you have your PT and INR levels drawn exactly as directed.  Many foods, especially foods high in vitamin K can interfere with warfarin and affect the PT and INR results. Foods high in vitamin K include spinach, kale, broccoli, cabbage, collard and turnip greens, brussels sprouts, peas, cauliflower, seaweed, and parsley as well as beef and pork liver, green tea, and soybean oil. You should eat a consistent amount of foods high in vitamin K. Avoid major changes in your diet, or notify your caregiver before changing your diet. Arrange a visit with a dietitian to answer your questions.  Many medicines can interfere with warfarin and affect the PT and INR results. You must tell your caregiver about any and all medicines you take, this includes all vitamins  and supplements. Be especially cautious with aspirin and anti-inflammatory medicines. Ask your caregiver before taking these. Do not take or discontinue any prescribed or over-the-counter medicine except on the advice of your caregiver or pharmacist.  Warfarin can have side effects, primarily excessive bruising or bleeding. You will need to hold pressure over cuts for longer than usual. Your caregiver or pharmacist will discuss other potential side effects.  Alcohol can change the body's ability to handle warfarin. It is best to avoid alcoholic drinks or consume only very small amounts while taking warfarin. Notify your caregiver if you change your alcohol intake.  Notify your dentist or other caregivers before procedures.  Activity. Ask your caregiver how soon you can go back to normal activities. It is important to stay active to prevent blood clots. If you are on anticoagulant medicine, avoid contact sports.  Exercise. It is very important to exercise. This is especially important while traveling, sitting or standing for long periods of time. Exercise your legs by walking or by pumping the muscles frequently. Take frequent walks.  Compression stockings. These are tight elastic stockings that apply pressure to the lower legs. This pressure can help keep the blood in the legs from clotting. You may need to wear compressions stockings at home to help prevent a DVT.  Smoking. If you smoke, quit. Ask your caregiver for help with quitting smoking.  Learn as much as you can about DVT. Knowing more about the condition should help you keep it from coming back.  Wear a medical alert bracelet or carry a medical alert card. SEEK MEDICAL CARE IF:  You notice a rapid heartbeat.  You feel weaker or more tired than usual.  You feel faint.  You notice increased bruising.  You feel your symptoms are not getting better in the time expected.  You believe you are having side effects of medicine. SEEK  IMMEDIATE MEDICAL CARE IF:  You have chest pain.  You have trouble breathing.  You have new or increased swelling or pain in one leg.  You cough up blood.  You notice blood in vomit, in a bowel movement, or in urine. MAKE SURE YOU:  Understand these instructions.  Will watch your condition.  Will get help right away if you are not doing well or get worse. Document Released: 01/22/2005 Document Revised: 10/17/2011 Document Reviewed: 03/16/2010 [employer] Patient Information 2014 [employer].  

## 2012-10-15 ENCOUNTER — Telehealth: Payer: Self-pay | Admitting: Gynecology

## 2012-10-15 NOTE — Telephone Encounter (Signed)
Unable to leave a message/mailbox full. Call to patient to schedule PUS/Endo BX.

## 2012-11-19 ENCOUNTER — Emergency Department (HOSPITAL_BASED_OUTPATIENT_CLINIC_OR_DEPARTMENT_OTHER)
Admission: EM | Admit: 2012-11-19 | Discharge: 2012-11-19 | Disposition: A | Discharge status: Home / Self Care | Payer: Federal, State, Local not specified - PPO | Attending: Emergency Medicine | Admitting: Emergency Medicine

## 2012-11-19 ENCOUNTER — Encounter (HOSPITAL_BASED_OUTPATIENT_CLINIC_OR_DEPARTMENT_OTHER): Payer: Self-pay | Admitting: Emergency Medicine

## 2012-11-19 DIAGNOSIS — IMO0002 Reserved for concepts with insufficient information to code with codable children: Secondary | ICD-10-CM | POA: Insufficient documentation

## 2012-11-19 DIAGNOSIS — Z79899 Other long term (current) drug therapy: Secondary | ICD-10-CM | POA: Insufficient documentation

## 2012-11-19 DIAGNOSIS — R011 Cardiac murmur, unspecified: Secondary | ICD-10-CM | POA: Insufficient documentation

## 2012-11-19 DIAGNOSIS — M542 Cervicalgia: Secondary | ICD-10-CM

## 2012-11-19 DIAGNOSIS — I10 Essential (primary) hypertension: Secondary | ICD-10-CM | POA: Insufficient documentation

## 2012-11-19 DIAGNOSIS — Z87891 Personal history of nicotine dependence: Secondary | ICD-10-CM | POA: Insufficient documentation

## 2012-11-19 DIAGNOSIS — J45909 Unspecified asthma, uncomplicated: Secondary | ICD-10-CM | POA: Insufficient documentation

## 2012-11-19 DIAGNOSIS — E119 Type 2 diabetes mellitus without complications: Secondary | ICD-10-CM | POA: Insufficient documentation

## 2012-11-19 MED ORDER — DIAZEPAM 5 MG PO TABS: 5.0000 mg | ORAL_TABLET | Freq: Two times a day (BID) | ORAL | Status: DC

## 2012-11-19 NOTE — ED Notes (Signed)
Pain in left upper back and left side of neck since last Thursday.  Pt reports she had an orthodontic procedure and pain started the following day.

## 2012-11-19 NOTE — ED Notes (Signed)
MD at bedside. 

## 2012-11-19 NOTE — ED Provider Notes (Signed)
CSN: [id number]     Arrival date & time 11/19/12  1030 History   First MD Initiated Contact with Patient 11/19/12 1040     Chief Complaint  Patient presents with  . Neck Pain   (Consider location/radiation/quality/duration/timing/severity/associated sxs/prior Treatment) HPI  Patient presents with pain focally about the left superior lateral neck.  Pain began last week, and following an orthodontic procedure, which required prolonged awkward positioning and tension.  Since onset has been severe, relieved transiently with ibuprofen.  The pain is focally about the trapezius left.  There is no new fever, nausea, vomiting, visual changes, weakness in an extremity, dysesthesia anywhere.  Past Medical History  Diagnosis Date  . Asthma   . Hypertension   . Diabetes mellitus   . Heart murmur     mild TR  . Hyperlipidemia   . Cushing syndrome     ?-pt states she was given this diagnosis during a hospitalization.  Records reviewed, no mention of dx or treatment   Past Surgical History  Procedure Laterality Date  . Tubal ligation  01/10/2000  . Cervical conization w/bx  1999   Family History  Problem Relation Age of Onset  . Thyroid disease Sister   . Hyperlipidemia Mother   . Heart murmur Daughter   . Heart murmur Son   . Diabetes Neg Hx   . Heart murmur Daughter   . Asthma Daughter   . Cancer Cousin 17    female cousin--unknown primary, metastatic  . Pulmonary embolism Daughter 80   History  Substance Use Topics  . Smoking status: Former Smoker -- 0.25 packs/day    Types: Cigarettes    Quit date: 02/21/2012  . Smokeless tobacco: Never Used  . Alcohol Use: No   OB History   Grav Para Term Preterm Abortions TAB SAB Ect Mult Living   5 3   2 1 1   3      Review of Systems  All other systems reviewed and are negative.    Allergies  Bactrim; Doxycycline; and Rocephin  Home Medications   Current Outpatient Rx  Name  Route  Sig  Dispense  Refill  . diazepam (VALIUM) 5  MG tablet   Oral   Take 1 tablet (5 mg total) by mouth 2 (two) times daily.   10 tablet   0   . SYMBICORT 160-4.5 MCG/ACT inhaler      INHALE 2 PUFFS AS DIRECTED TWICE A DAY   10.2 g   8    BP 188/101  Pulse 90  Temp(Src) 98.1 F (36.7 C) (Oral)  Resp 16  Ht 5\' 4"  (1.626 m)  Wt 230 lb (104.327 kg)  BMI 39.46 kg/m2  SpO2 100%  LMP 11/08/2012 Physical Exam  Nursing note and vitals reviewed. Constitutional: She is oriented to person, place, and time. She appears well-developed and well-nourished. No distress.  HENT:  Head: Normocephalic and atraumatic.  Eyes: Conjunctivae and EOM are normal.  Neck: Normal range of motion. Muscular tenderness present. No spinous process tenderness present. No rigidity. No edema present.    Cardiovascular: Normal rate and regular rhythm.   Pulmonary/Chest: Effort normal and breath sounds normal. No stridor. No respiratory distress.  Abdominal: She exhibits no distension.  Musculoskeletal: She exhibits no edema.       Right shoulder: Normal.       Left shoulder: Normal.       Left elbow: Normal.       Arms: Neurological: She is alert and oriented to  person, place, and time. No cranial nerve deficit.  Skin: Skin is warm and dry.  Psychiatric: She has a normal mood and affect.    ED Course  Procedures (including critical care time) Labs Review Labs Reviewed - No data to display Imaging Review No results found.  EKG Interpretation   None       MDM   1. Neck pain on left side    Patient presents with neck pain, no neurologic dysfunction, no confusion, no disorientation, no red flags for occult systemic process.  With initiation of muscle relaxants, the patient was discharged in stable condition to follow up with her physicians.    Gerhard Munch, MD 11/19/12 [phone]

## 2012-11-20 ENCOUNTER — Telehealth: Payer: Self-pay | Admitting: Family Medicine

## 2012-11-20 NOTE — Telephone Encounter (Signed)
Sent letter to pt advise call us before going to ER if not emergency

## 2013-02-17 ENCOUNTER — Other Ambulatory Visit: Payer: Self-pay | Admitting: Family Medicine

## 2013-02-17 NOTE — Telephone Encounter (Signed)
PATIENT NEEDS A OFFICE VISIT TO FOLLOW UP ON HER ASTHMA BEFORE THIS INHALER RUNS OUT. THANKS

## 2013-03-29 ENCOUNTER — Other Ambulatory Visit: Payer: Self-pay | Admitting: Family Medicine

## 2013-04-16 ENCOUNTER — Ambulatory Visit (INDEPENDENT_AMBULATORY_CARE_PROVIDER_SITE_OTHER): Payer: Federal, State, Local not specified - PPO | Admitting: Emergency Medicine

## 2013-04-16 VITALS — BP 148/94 | HR 77 | Temp 98.9°F | Resp 18 | Ht 64.5 in | Wt 236.0 lb

## 2013-04-16 DIAGNOSIS — R030 Elevated blood-pressure reading, without diagnosis of hypertension: Secondary | ICD-10-CM

## 2013-04-16 DIAGNOSIS — G4733 Obstructive sleep apnea (adult) (pediatric): Secondary | ICD-10-CM

## 2013-04-16 DIAGNOSIS — J45909 Unspecified asthma, uncomplicated: Secondary | ICD-10-CM

## 2013-04-16 MED ORDER — ALBUTEROL SULFATE HFA 108 (90 BASE) MCG/ACT IN AERS: 2.0000 | INHALATION_SPRAY | RESPIRATORY_TRACT | Status: DC | PRN

## 2013-04-16 NOTE — Progress Notes (Signed)
[facility] [address], [place] [address] [phone]  Date:  04/16/2013   Name:  [name]   DOB:  [birth year]   MRN:  [id number]  PCP:  Wyatt Haste, MD    Chief Complaint: Hypertension and Asthma   History of Present Illness:  [name] is a 48 y.o. very pleasant female patient who presents with the following:  Has a number of concerns.  Has a home BP kit and her kit read 200/110 and she is alarmed.  She is asymptomatic.  No chest pain. Has a headache not associated with neuro or visual symptoms.  Has run out of her symbicort and feels "better" off the medication.  She has not experienced an asthma exacerbation for years.  Denies cough, wheezing or shortness of breath.   Has some intermittent tingling in her hands that is short lived and not associated with activity or sleeping.  No overuse. Has severe snoring and sleeps poorly only 6 hours nightly.  Is separated.    Patient Active Problem List   Diagnosis Date Noted  . Essential hypertension, benign 06/05/2011  . Diabetes mellitus without mention of complication 65/78/4696  . Type II or unspecified type diabetes mellitus without mention of complication, not stated as uncontrolled 09/22/2010  . Asthma 07/21/2010  . HTN (hypertension) 07/21/2010  . Heart murmur 07/21/2010  . Cushing's syndrome 07/21/2010  . Diabetes mellitus 07/21/2010  . Hyperlipidemia 07/21/2010  . Rhinitis, allergic 07/21/2010    Past Medical History  Diagnosis Date  . Asthma   . Hypertension   . Diabetes mellitus   . Heart murmur     mild TR  . Hyperlipidemia   . Cushing syndrome     ?-pt states she was given this diagnosis during a hospitalization.  Records reviewed, no mention of dx or treatment  . Allergy     Past Surgical History  Procedure Laterality Date  . Tubal ligation  01/10/2000  . Cervical conization w/bx  1999    History  Substance Use Topics  . Smoking status: Former Smoker --  0.25 packs/day    Types: Cigarettes    Quit date: 02/21/2012  . Smokeless tobacco: Never Used  . Alcohol Use: No    Family History  Problem Relation Age of Onset  . Thyroid disease Sister   . Hyperlipidemia Mother   . Heart murmur Daughter   . Heart murmur Son   . Diabetes Neg Hx   . Heart murmur Daughter   . Asthma Daughter   . Cancer Cousin 1    female cousin--unknown primary, metastatic  . Pulmonary embolism Daughter 60  . Hypertension Father     Allergies  Allergen Reactions  . Bactrim [Sulfamethoxazole-Trimethoprim]     Lip swelling, mouth itching  . Doxycycline     Severe headache  . Rocephin [Ceftriaxone Sodium] Hives, Itching and Other (See Comments)    wheezing    Medication list has been reviewed and updated.  Current Outpatient Prescriptions on File Prior to Visit  Medication Sig Dispense Refill  . SYMBICORT 160-4.5 MCG/ACT inhaler INHALE 2 PUFFS AS DIRECTED TWICE A DAY  10.2 g  0  . diazepam (VALIUM) 5 MG tablet Take 1 tablet (5 mg total) by mouth 2 (two) times daily.  10 tablet  0   No current facility-administered medications on file prior to visit.    Review of Systems:  As per HPI, otherwise negative.    Physical Examination: Filed Vitals:  04/16/13 1541  BP: 148/94  Pulse: 77  Temp: 98.9 F (37.2 C)  Resp: 18   Filed Vitals:   04/16/13 1541  Height: 5' 4.5" (1.638 m)  Weight: 236 lb (107.049 kg)   Body mass index is 39.9 kg/(m^2). Ideal Body Weight: Weight in (lb) to have BMI = 25: 147.6  GEN: obese, NAD, Non-toxic, A & O x 3 HEENT: Atraumatic, Normocephalic. Neck supple. No masses, No LAD. Ears and Nose: No external deformity. CV: RRR, No M/G/R. No JVD. No thrill. No extra heart sounds. PULM: CTA B, no wheezes, crackles, rhonchi. No retractions. No resp. distress. No accessory muscle use. ABD: S, NT, ND, +BS. No rebound. No HSM. EXTR: No c/c/e NEURO Normal gait.  PSYCH: Normally interactive. Conversant. Not depressed or  anxious appearing.  Calm demeanor.    Assessment and Plan: OSA Borderline HBP.  To bring her machine in to be checked against ours. Asthma Refill albuterol   Signed,  Ellison Carwin, MD

## 2013-04-16 NOTE — Patient Instructions (Signed)

## 2013-05-18 ENCOUNTER — Ambulatory Visit (INDEPENDENT_AMBULATORY_CARE_PROVIDER_SITE_OTHER): Payer: Federal, State, Local not specified - PPO | Admitting: Neurology

## 2013-05-18 ENCOUNTER — Encounter (INDEPENDENT_AMBULATORY_CARE_PROVIDER_SITE_OTHER): Payer: Self-pay

## 2013-05-18 ENCOUNTER — Encounter: Payer: Self-pay | Admitting: Neurology

## 2013-05-18 VITALS — BP 164/98 | HR 80 | Resp 18 | Ht 65.75 in | Wt 237.0 lb

## 2013-05-18 DIAGNOSIS — R0683 Snoring: Secondary | ICD-10-CM | POA: Insufficient documentation

## 2013-05-18 DIAGNOSIS — I10 Essential (primary) hypertension: Secondary | ICD-10-CM | POA: Insufficient documentation

## 2013-05-18 DIAGNOSIS — R0989 Other specified symptoms and signs involving the circulatory and respiratory systems: Secondary | ICD-10-CM

## 2013-05-18 DIAGNOSIS — R0609 Other forms of dyspnea: Secondary | ICD-10-CM

## 2013-05-18 NOTE — Patient Instructions (Signed)
Sleep Apnea Sleep apnea is disorder that affects a person's sleep. A person with sleep apnea has abnormal pauses in their breathing when they sleep. It is hard for them to get a good sleep. This makes a person tired during the day. It also can lead to other physical problems. There are three types of sleep apnea. One type is when breathing stops for a short time because your airway is blocked (obstructive sleep apnea). Another type is when the brain sometimes fails to give the normal signal to breathe to the muscles that control your breathing (central sleep apnea). The third type is a combination of the other two types. HOME CARE  Do not sleep on your back. Try to sleep on your side.  Take all medicine as told by your doctor.  Avoid alcohol, calming medicines (sedatives), and depressant drugs.  Try to lose weight if you are overweight. Talk to your doctor about a healthy weight goal. Your doctor may have you use a device that helps to open your airway. It can help you get the air that you need. It is called a positive airway pressure (PAP) device. There are three types of PAP devices:  Continuous positive airway pressure (CPAP) device.  Nasal expiratory positive airway pressure (EPAP) device.  Bilevel positive airway pressure (BPAP) device. MAKE SURE YOU:  Understand these instructions.  Will watch your condition.  Will get help right away if you are not doing well or get worse. Document Released: 11/01/2007 Document Revised: 01/09/2012 Document Reviewed: 05/26/2011 [facility]ExitCare Patient Information 2014 [place]Care, [place]. Obesity Obesity is having too much body fat and a body mass index (BMI) of 30 or more. BMI is a number based on your height and weight. The number is an estimate of how much body fat you have. Obesity can happen if you eat more calories than you can burn by exercising or other activity. It can cause major health problems or emergencies.  HOME CARE  Exercise and be active  as told by your doctor. Try:  Using stairs when you can.  Parking farther away from store doors.  Gardening, biking, or walking.  Eat healthy foods and drinks that are low in calories. Eat more fruits and vegetables.  Limit fast food, sweets, and snack foods that are made with ingredients that are not natural (processed food).  Eat smaller amounts of food.  Keep a journal and write down what you eat every day. Websites can help with this.  Avoid drinking alcohol. Drink more water and drinks without calories.   Take vitamins and dietary pills (supplements) only as told by your doctor.  Try going to weight-loss support groups or classes to help lessen stress. Dieticians and counselors may also help. GET HELP RIGHT AWAY IF:  You have chest pain or tightness.  You have trouble breathing or feel short of breath.  You feel weak or have loss of feeling (numbness) in your legs.  You feel confused or have trouble talking.  You have sudden changes in your vision. MAKE SURE YOU:  Understand these instructions.  Will watch your condition.  Will get help right away if you are not doing well or get worse. Document Released: 04/16/2011 Document Reviewed: 04/16/2011 [facility]Exit[facility] Patient Information 2014 [place]ExitCare, [place]LLC.

## 2013-05-18 NOTE — Progress Notes (Signed)
[facility]  Provider:  Melvyn Novas, M D  Referring Provider: Marice Potter, MD   Primary Care Physician:  Carollee Herter, MD   Chief Complaint  Patient presents with  . New Evaluation    Room 10  . Sleep consult    HPI:  [name] is a 48 y.o., african -[place] , right handed  female. She is seen here as a referral from Dr. Dareen Piano, [facility], after she developed persistent elevated blood pressures.  [name] reports that 6 years ago around her 22 of her stay she begun taking medicine for hypertension. Recently by nor longer on medication she discovered that her blood pressure was elevated again. She used a Hall blood pressure machine and on persistently systolic values over 150. With each she became concerned enough to visit the [facility] and Dr. Dareen Piano evaluated her. He suspected that persistently elevated blood pressures may be a symptom of untreated obstructive sleep apnea.  He compared the BP machines and the new standardized machine still showed elevated BP, but less than on her original machine.  Her right arm BP varied form her left.  The patient has a history of chronic asthma and uses an inhaler as needed, she has allergic rhinitis and doing allergy season is a mouth breather, she had gained weight since her 7 th birthday and has a  BMI of 40.  She wakes with a dry mouth and often with headaches. Her morning headaches have gotten worse over the last weeks, she also reports that the headaches don't necessarily wake her up from sleep -but she wakes up with headaches being present. Her family has reported that she snores loudly. Her husband never mentioned apnea and the couple just recently separated. She started at 5 AM and leaves work at 1:30 PM.  There is no daylight exposure at her work place. Job is not physically challenging, she sitting at a desk now.  She does not take daytime naps, but admits she has tried.  She just can't fall asleep in daytime. She will have one cup of coffee in the morning but none during her work hours. She is rising at 3.30 AM and , she needs 2 different alarm clocks  to not oversleep. She goes to bed around 9 PM , she falls asleep by 9.30 Pm, she doesn't sleep with a TV , it gives her nightmares.   She goes 2-3 times to the bathrooms, and her overall sleep duration is less than 6 hours nightly.  She often plays with her i- pad and she noticed she cannot easily sleep when exposed to it's screen light. She learned that she can better sleep with just white noise in the back ground.   [name] used to work at nighttime until January of this year.  She is now on a daytime shift.       Review of Systems: Out of a complete 14 system review, the patient complains of only the following symptoms, and all other reviewed systems are negative. Snoring, headaches, dry mouth, acting out dreams. Kicking, sleep talking, yelling, crying .all when under stress.   The patient endorsed the Epworth sleepiness score at 14 points and her fatigue severity score at 36 points. She further endorsed best interest in activities, just decreased energy, depression and anxiety, feeling hot, having a history of anemia, palpitations and a heart murmur.   Her past medical history was endorsed for hypertension, diabetes, hypercholesterolemia, depression-anxiety.  History   Social History  . Marital Status: Legally Separated    Spouse Name: N/A    Number of Children: 3  . Years of Education: College   Occupational History  . data collection [employer]   Social History Main Topics  . Smoking status: Former Smoker -- 0.25 packs/day    Types: Cigarettes    Quit date: 02/21/2012  . Smokeless tobacco: Never Used  . Alcohol Use: No  . Drug Use: No  . Sexual Activity: Yes    Birth Control/ Protection: Surgical     Comment: BTL   Other Topics Concern  . Not on file   Social History Narrative    Patient is separated and lives at home with her children.   Patient has three children   Patient has a college education.   Patient is right-handed.   Patient drinks a 12 oz of coffee every morning.    Family History  Problem Relation Age of Onset  . Thyroid disease Sister   . Hyperlipidemia Mother   . Heart murmur Daughter   . Heart murmur Son   . Diabetes Neg Hx   . Heart murmur Daughter   . Asthma Daughter   . Cancer Cousin [id number]    female cousin--unknown primary, metastatic  . Pulmonary embolism Daughter [id number]  . Hypertension Father     Past Medical History  Diagnosis Date  . Asthma   . Hypertension   . Diabetes mellitus   . Heart murmur     mild TR  . Hyperlipidemia   . Cushing syndrome     ?-pt states she was given this diagnosis during a hospitalization.  Records reviewed, no mention of dx or treatment  . Allergy     Past Surgical History  Procedure Laterality Date  . Tubal ligation  01/10/2000  . Cervical conization w/bx  1999    Current Outpatient Prescriptions  Medication Sig Dispense Refill  . albuterol (PROVENTIL HFA;VENTOLIN HFA) 108 (90 BASE) MCG/ACT inhaler Inhale 2 puffs into the lungs every 4 (four) hours as needed for wheezing or shortness of breath (cough, shortness of breath or wheezing.).  1 Inhaler  12   No current facility-administered medications for this visit.    Allergies as of 05/18/2013 - Review Complete 05/18/2013  Allergen Reaction Noted  . Bactrim [sulfamethoxazole-trimethoprim]  06/25/2012  . Doxycycline  06/25/2012  . Rocephin [ceftriaxone sodium] Hives, Itching, and Other (See Comments) 07/21/2010    Vitals: BP 164/98  Pulse 80  Resp 18  Ht 5' 5.75" (1.67 m)  Wt 237 lb (107.502 kg)  BMI 38.55 kg/m2  LMP 04/25/2013 Last Weight:  Wt Readings from Last 1 Encounters:  05/18/13 237 lb (107.502 kg)   Last Height:   Ht Readings from Last 1 Encounters:  05/18/13 5' 5.75" (1.67 m)    Physical exam:  General: The patient  is awake, alert and appears not in acute distress. The patient is well groomed. Head: Normocephalic, atraumatic. Neck is supple. Mallampati 4, large tongue  , neck circumference:16 inches, nasal congestion- strained breathing.   Cardiovascular:  Regular rate and rhythm, without  murmurs or carotid bruit, and without distended neck veins. Respiratory: Lungs are clear to auscultation. No wheezing.  Has a history of repeated sinusitis and asthma. Skin:  Without evidence of edema, or rash Trunk: BMI is  elevated and patient  has normal posture.  Neurologic exam : The patient is awake and alert, oriented to place and time.  Memory subjective  described as intact.  There is a normal attention span & concentration ability. Speech is fluent without  dysarthria, dysphonia or aphasia. Mood and affect are appropriate.  Cranial nerves: Pupils are equal and briskly reactive to light. Funduscopic exam without   evidence of pallor or edema. Extraocular movements  in vertical and horizontal planes intact and without nystagmus. Visual fields by finger perimetry are intact. Hearing to finger rub intact.  Facial sensation intact to fine touch. Facial motor strength is symmetric and tongue and uvula move midline.  Motor exam:  Normal tone and normal muscle bulk and symmetric normal strength in all extremities.  Sensory:  Fine touch, pinprick and vibration were tested in all extremities. Proprioception is  normal.  Coordination: Rapid alternating movements in the fingers/hands is  normal. Finger-to-nose maneuver tested and normal without evidence of ataxia, dysmetria or tremor.  Gait and station: Patient walks without assistive device . Strength within normal limits. Stance is stable and normal.   Deep tendon reflexes: in the  upper and lower extremities are symmetric and intact. Babinski maneuver response is down going.   Assessment:  After physical and neurologic examination, review of laboratory studies,  imaging, neurophysiology testing and pre-existing records, assessment is   1) high risk of OSA based on BMI 40 and Mallampati - snoring and dry mouth, nocturia .   Rhinitis: Nasal spray flonase/ nasonex to be used at night prior to study.  The morning headaches are becoming more sever and are different from sinus headaches she had in the past.  There is a possible obesity hypoventilation syndrome. She has retainers for dental correction, she feels better when sleeping with these, possible side effect of apnea or bruxism  treatment?   Plan:  Treatment plan and additional workup : Split study at AHI 15 and 3% score, CO2 needed.

## 2013-06-01 LAB — HM MAMMOGRAPHY

## 2013-06-08 LAB — HM MAMMOGRAPHY

## 2013-06-09 ENCOUNTER — Encounter: Payer: Self-pay | Admitting: Internal Medicine

## 2013-06-12 ENCOUNTER — Encounter: Payer: Self-pay | Admitting: Medical

## 2013-06-15 ENCOUNTER — Encounter: Payer: Self-pay | Admitting: Internal Medicine

## 2013-06-23 ENCOUNTER — Ambulatory Visit (INDEPENDENT_AMBULATORY_CARE_PROVIDER_SITE_OTHER): Payer: Federal, State, Local not specified - PPO | Admitting: Neurology

## 2013-06-23 DIAGNOSIS — I10 Essential (primary) hypertension: Secondary | ICD-10-CM

## 2013-06-23 DIAGNOSIS — G4733 Obstructive sleep apnea (adult) (pediatric): Secondary | ICD-10-CM

## 2013-06-23 DIAGNOSIS — R0683 Snoring: Secondary | ICD-10-CM

## 2013-07-06 ENCOUNTER — Telehealth: Payer: Self-pay | Admitting: Neurology

## 2013-07-06 DIAGNOSIS — G4733 Obstructive sleep apnea (adult) (pediatric): Secondary | ICD-10-CM

## 2013-07-06 NOTE — Telephone Encounter (Signed)
I called and left a message for the patient about her recent sleep study results. I informed the patient that the study revealed obstructive sleep apnea and that she did well on CPAP during the night of her study with significant improvement in her respiratory events. Dr. Vickey Huger recommend starting CPAP therapy at home. I have asked the patient to callback to give a name of a durable medical equipment company she would like for her order to be sent. I will fax a copy of the report to Dr. Carmelina Dane office and mail a copy of the report to the patient along with a follow up instruction letter.

## 2013-07-07 ENCOUNTER — Encounter: Payer: Self-pay | Admitting: *Deleted

## 2013-08-11 ENCOUNTER — Other Ambulatory Visit: Payer: Self-pay | Admitting: Emergency Medicine

## 2013-08-13 ENCOUNTER — Encounter: Payer: Self-pay | Admitting: Neurology

## 2013-08-15 ENCOUNTER — Encounter: Payer: Self-pay | Admitting: Neurology

## 2013-10-04 ENCOUNTER — Encounter (HOSPITAL_COMMUNITY): Payer: Self-pay | Admitting: Emergency Medicine

## 2013-10-04 ENCOUNTER — Emergency Department (HOSPITAL_COMMUNITY): Payer: Federal, State, Local not specified - PPO

## 2013-10-04 ENCOUNTER — Emergency Department (HOSPITAL_COMMUNITY)
Admission: EM | Admit: 2013-10-04 | Discharge: 2013-10-04 | Disposition: A | Discharge status: Home / Self Care | Payer: Federal, State, Local not specified - PPO | Attending: Emergency Medicine | Admitting: Emergency Medicine

## 2013-10-04 DIAGNOSIS — Z3202 Encounter for pregnancy test, result negative: Secondary | ICD-10-CM | POA: Diagnosis not present

## 2013-10-04 DIAGNOSIS — R079 Chest pain, unspecified: Secondary | ICD-10-CM | POA: Diagnosis present

## 2013-10-04 DIAGNOSIS — E119 Type 2 diabetes mellitus without complications: Secondary | ICD-10-CM | POA: Diagnosis not present

## 2013-10-04 DIAGNOSIS — K8020 Calculus of gallbladder without cholecystitis without obstruction: Secondary | ICD-10-CM | POA: Diagnosis not present

## 2013-10-04 DIAGNOSIS — Z79899 Other long term (current) drug therapy: Secondary | ICD-10-CM | POA: Insufficient documentation

## 2013-10-04 DIAGNOSIS — I10 Essential (primary) hypertension: Secondary | ICD-10-CM | POA: Diagnosis not present

## 2013-10-04 DIAGNOSIS — R011 Cardiac murmur, unspecified: Secondary | ICD-10-CM | POA: Insufficient documentation

## 2013-10-04 DIAGNOSIS — K8050 Calculus of bile duct without cholangitis or cholecystitis without obstruction: Secondary | ICD-10-CM

## 2013-10-04 DIAGNOSIS — R61 Generalized hyperhidrosis: Secondary | ICD-10-CM | POA: Insufficient documentation

## 2013-10-04 DIAGNOSIS — R42 Dizziness and giddiness: Secondary | ICD-10-CM | POA: Diagnosis not present

## 2013-10-04 DIAGNOSIS — Z9851 Tubal ligation status: Secondary | ICD-10-CM | POA: Diagnosis not present

## 2013-10-04 DIAGNOSIS — J45909 Unspecified asthma, uncomplicated: Secondary | ICD-10-CM | POA: Insufficient documentation

## 2013-10-04 DIAGNOSIS — Z87891 Personal history of nicotine dependence: Secondary | ICD-10-CM | POA: Insufficient documentation

## 2013-10-04 LAB — URINALYSIS, ROUTINE W REFLEX MICROSCOPIC
Bilirubin Urine: NEGATIVE
Glucose, UA: NEGATIVE mg/dL
Ketones, ur: NEGATIVE mg/dL
Leukocytes, UA: NEGATIVE
NITRITE: NEGATIVE
Protein, ur: NEGATIVE mg/dL
SPECIFIC GRAVITY, URINE: 1.013 (ref 1.005–1.030)
UROBILINOGEN UA: 0.2 mg/dL (ref 0.0–1.0)
pH: 7.5 (ref 5.0–8.0)

## 2013-10-04 LAB — I-STAT TROPONIN, ED: Troponin i, poc: 0 ng/mL (ref 0.00–0.08)

## 2013-10-04 LAB — BASIC METABOLIC PANEL
Anion gap: 14 (ref 5–15)
BUN: 7 mg/dL (ref 6–23)
CO2: 24 mEq/L (ref 19–32)
Calcium: 9.6 mg/dL (ref 8.4–10.5)
Chloride: 99 mEq/L (ref 96–112)
Creatinine, Ser: 0.7 mg/dL (ref 0.50–1.10)
GFR calc Af Amer: >90 mL/min (ref >90)
GFR calc non Af Amer: >90 mL/min (ref >90)
Glucose, Bld: 121 mg/dL — ABNORMAL HIGH (ref 70–99)
Potassium: 3.6 mEq/L — ABNORMAL LOW (ref 3.7–5.3)
Sodium: 137 mEq/L (ref 137–147)

## 2013-10-04 LAB — CBC
HCT: 34.7 % — ABNORMAL LOW (ref 36.0–46.0)
Hemoglobin: 11.8 g/dL — ABNORMAL LOW (ref 12.0–15.0)
MCH: 24.2 pg — ABNORMAL LOW (ref 26.0–34.0)
MCHC: 34 g/dL (ref 30.0–36.0)
MCV: 71.1 fL — ABNORMAL LOW (ref 78.0–100.0)
Platelets: 336 10*3/uL (ref 150–400)
RBC: 4.88 MIL/uL (ref 3.87–5.11)
RDW: 15.6 % — ABNORMAL HIGH (ref 11.5–15.5)
WBC: 9.6 10*3/uL (ref 4.0–10.5)

## 2013-10-04 LAB — HEPATIC FUNCTION PANEL
ALT: 13 U/L (ref 0–35)
AST: 21 U/L (ref 0–37)
Albumin: 3.8 g/dL (ref 3.5–5.2)
Alkaline Phosphatase: 81 U/L (ref 39–117)
Total Bilirubin: 0.3 mg/dL (ref 0.3–1.2)
Total Protein: 7.3 g/dL (ref 6.0–8.3)

## 2013-10-04 LAB — URINE MICROSCOPIC-ADD ON

## 2013-10-04 LAB — POC URINE PREG, ED: PREG TEST UR: NEGATIVE

## 2013-10-04 LAB — PRO B NATRIURETIC PEPTIDE: Pro B Natriuretic peptide (BNP): 103.7 pg/mL (ref 0–125)

## 2013-10-04 LAB — LIPASE, BLOOD: LIPASE: 26 U/L (ref 11–59)

## 2013-10-04 MED ORDER — HYDROMORPHONE HCL PF 1 MG/ML IJ SOLN
1.0000 mg | Freq: Once | INTRAMUSCULAR | Status: AC
  Administered 2013-10-04: 1 mg via INTRAVENOUS
  Filled 2013-10-04: qty 1

## 2013-10-04 MED ORDER — ONDANSETRON 8 MG PO TBDP: 8.0000 mg | ORAL_TABLET | Freq: Three times a day (TID) | ORAL | Status: DC | PRN

## 2013-10-04 MED ORDER — ONDANSETRON HCL 4 MG/2ML IJ SOLN
4.0000 mg | Freq: Once | INTRAMUSCULAR | Status: AC
  Administered 2013-10-04: 4 mg via INTRAVENOUS
  Filled 2013-10-04: qty 2

## 2013-10-04 MED ORDER — OXYCODONE-ACETAMINOPHEN 5-325 MG PO TABS: 1.0000 | ORAL_TABLET | ORAL | Status: DC | PRN

## 2013-10-04 NOTE — ED Notes (Signed)
Patient describes upper right abdominal/lower right chest pain, patient is unable to describe the quality, constant in nature that radiates to back, since 1400 today after eating a biscuit and hashbrowns from [employer] today.

## 2013-10-04 NOTE — ED Notes (Signed)
Called lab to inquire about LFT and Lipase, labs are running now

## 2013-10-04 NOTE — ED Notes (Signed)
Patient transported to X-ray 

## 2013-10-04 NOTE — ED Notes (Signed)
Reita May, PA at bedside

## 2013-10-04 NOTE — ED Notes (Signed)
Patient to US

## 2013-10-04 NOTE — ED Notes (Signed)
She c/o upper abd pain onset while eating at 1400 today. She took tums, ibuprofen, and drank soda with no relief of pain. She reports nausea and mild SOB also.

## 2013-10-04 NOTE — ED Provider Notes (Signed)
CSN: [id number]     Arrival date & time 10/04/13  4098 History   First MD Initiated Contact with Patient 10/04/13 1858     Chief Complaint  Patient presents with  . Chest Pain     (Consider location/radiation/quality/duration/timing/severity/associated sxs/prior Treatment) HPI  [name] is a 48 y.o. female who presents to emergency department complaining of abdominal pain. Patient states she ate a biscuit with potatoes around 1 PM, states around 2:00 developed severe epigastric abdominal pain that's now radiating to the right flank. States has associated nausea, no vomiting. States she feels "flushed" and dizzy. She told the nurses and from that her pain radiates into the chest, however she denies that at present. States pain does radiate to the back. She denies any heavy alcohol, states she drinks occasionally. She does take ibuprofen, but as needed and not daily. In fact she took ibuprofen today after her pain started which did not help the pain. She denies any prior similar pain. No history of abdominal surgeries. No other complaints. Nothing that she can think of makes pain better or worse. Pain is sharp.  Past Medical History  Diagnosis Date  . Asthma   . Hypertension   . Diabetes mellitus   . Heart murmur     mild TR  . Hyperlipidemia   . Cushing syndrome     ?-pt states she was given this diagnosis during a hospitalization.  Records reviewed, no mention of dx or treatment  . Allergy    Past Surgical History  Procedure Laterality Date  . Tubal ligation  01/10/2000  . Cervical conization w/bx  1999   Family History  Problem Relation Age of Onset  . Thyroid disease Sister   . Hyperlipidemia Mother   . Heart murmur Daughter   . Heart murmur Son   . Diabetes Neg Hx   . Heart murmur Daughter   . Asthma Daughter   . Cancer Cousin 24    female cousin--unknown primary, metastatic  . Pulmonary embolism Daughter 5  . Hypertension Father    History  Substance Use  Topics  . Smoking status: Former Smoker -- 0.25 packs/day    Types: Cigarettes    Quit date: 02/21/2012  . Smokeless tobacco: Never Used  . Alcohol Use: No   OB History   Grav Para Term Preterm Abortions TAB SAB Ect Mult Living   Review of Systems  Constitutional: Positive for diaphoresis. Negative for fever and chills.  Respiratory: Negative for cough, chest tightness and shortness of breath.   Cardiovascular: Negative for chest pain, palpitations and leg swelling.  Gastrointestinal: Positive for nausea and abdominal pain. Negative for vomiting and diarrhea.  Genitourinary: Negative for dysuria, flank pain, vaginal bleeding, vaginal discharge, vaginal pain and pelvic pain.  Musculoskeletal: Negative for arthralgias, myalgias, neck pain and neck stiffness.  Skin: Negative for rash.  Neurological: Positive for dizziness and light-headedness. Negative for weakness and headaches.  All other systems reviewed and are negative.      Allergies  Bactrim; Doxycycline; and Rocephin  Home Medications   Prior to Admission medications   Medication Sig Start Date End Date Taking? Authorizing Provider  albuterol (PROVENTIL HFA;VENTOLIN HFA) 108 (90 BASE) MCG/ACT inhaler Inhale 2 puffs into the lungs every 4 (four) hours as needed for wheezing or shortness of breath.   Yes Historical Provider, MD  calcium carbonate (TUMS - DOSED IN MG ELEMENTAL CALCIUM) 500 MG  chewable tablet Chew 1 tablet by mouth daily as needed for indigestion or heartburn.   Yes Historical Provider, MD  ibuprofen (ADVIL,MOTRIN) 200 MG tablet Take 200 mg by mouth every 6 (six) hours as needed (pain).   Yes Historical Provider, MD   BP 189/95  Pulse 73  Temp(Src) 98.2 F (36.8 C) (Oral)  Resp 22  SpO2 100%  LMP 09/06/2013 Physical Exam  Nursing note and vitals reviewed. Constitutional: She appears well-developed and well-nourished. No distress.  HENT:  Head: Normocephalic.  Eyes: Conjunctivae  are normal.  Neck: Neck supple.  Cardiovascular: Normal rate, regular rhythm and normal heart sounds.   Pulmonary/Chest: Effort normal and breath sounds normal. No respiratory distress. She has no wheezes. She has no rales.  Abdominal: Soft. Bowel sounds are normal. She exhibits no distension. There is tenderness. There is no rebound.  Right upper quadrant, epigastric tenderness. No guarding.  Musculoskeletal: She exhibits no edema.  Neurological: She is alert.  Skin: Skin is warm and dry.  Psychiatric: She has a normal mood and affect. Her behavior is normal.    ED Course  Procedures (including critical care time) Labs Review Labs Reviewed  CBC - Abnormal; Notable for the following:    Hemoglobin 11.8 (*)    HCT 34.7 (*)    MCV 71.1 (*)    MCH 24.2 (*)    RDW 15.6 (*)    All other components within normal limits  BASIC METABOLIC PANEL - Abnormal; Notable for the following:    Potassium 3.6 (*)    Glucose, Bld 121 (*)    All other components within normal limits  PRO B NATRIURETIC PEPTIDE  LIPASE, BLOOD  HEPATIC FUNCTION PANEL  I-STAT TROPOININ, ED    Imaging Review Dg Chest 2 View  10/04/2013   CLINICAL DATA:  CHEST PAIN  EXAM: CHEST - 2 VIEW  COMPARISON:  11/07/2006  FINDINGS: Coarse perihilar interstitial opacities as before. No focal airspace infiltrate. Heart size normal. No effusion. Visualized skeletal structures are unremarkable.  IMPRESSION: 1. Chronic central interstitial prominence.  No acute abnormality.   Electronically Signed   By: Oley Balm M.D.   On: 10/04/2013 19:24     EKG Interpretation None      MDM   Final diagnoses:  Biliary colic   Pt with epigastric pain, radiating to the right upper abdomen and back. Suspicious for biliary colic. Will get labs, [place] abd. Pt did mention at triage that she had some chest pain, already got ecg and trop which both look normal. Will monitror. Dilaudid and zofran for pain and nausea.    10:03 PM Labs all OK,  no concerning findings to explain symptoms. US showing gallstones with no signs of cholecystitis or biliary dilation. Pt's pain is under control. Abdomen is benign. Will d/c home with pain medications, follow up pcp and general surgery. Advised to return if worsening symptoms, vomiting, fever, any new concerning symptoms.   Filed Vitals:   10/04/13 1945 10/04/13 2000 10/04/13 2015 10/04/13 2030  BP:  173/88 172/88 156/70  Pulse: 64 56 51 56  Temp:      TempSrc:      Resp: SpO2: 100% 100% 98% 98%       Lottie Mussel, PA-C 10/05/13 0039

## 2013-10-04 NOTE — Discharge Instructions (Signed)
Take pain medication as prescribed as needed. Take nausea medication as prescribed as needed. Follow up with general surgery if pain continues. Return if worsening.    Biliary Colic  Biliary colic is a steady or irregular pain in the upper abdomen. It is usually under the right side of the rib cage. It happens when gallstones interfere with the normal flow of bile from the gallbladder. Bile is a liquid that helps to digest fats. Bile is made in the liver and stored in the gallbladder. When you eat a meal, bile passes from the gallbladder through the cystic duct and the common bile duct into the small intestine. There, it mixes with partially digested food. If a gallstone blocks either of these ducts, the normal flow of bile is blocked. The muscle cells in the bile duct contract forcefully to try to move the stone. This causes the pain of biliary colic.  SYMPTOMS   A person with biliary colic usually complains of pain in the upper abdomen. This pain can be:  In the center of the upper abdomen just below the breastbone.  In the upper-right part of the abdomen, near the gallbladder and liver.  Spread back toward the right shoulder blade.  Nausea and vomiting.  The pain usually occurs after eating.  Biliary colic is usually triggered by the digestive system's demand for bile. The demand for bile is high after fatty meals. Symptoms can also occur when a person who has been fasting suddenly eats a very large meal. Most episodes of biliary colic pass after 1 to 5 hours. After the most intense pain passes, your abdomen may continue to ache mildly for about 24 hours. DIAGNOSIS  After you describe your symptoms, your caregiver will perform a physical exam. He or she will pay attention to the upper right portion of your belly (abdomen). This is the area of your liver and gallbladder. An ultrasound will help your caregiver look for gallstones. Specialized scans of the gallbladder may also be done. Blood  tests may be done, especially if you have fever or if your pain persists. PREVENTION  Biliary colic can be prevented by controlling the risk factors for gallstones. Some of these risk factors, such as heredity, increasing age, and pregnancy are a normal part of life. Obesity and a high-fat diet are risk factors you can change through a healthy lifestyle. Women going through menopause who take hormone replacement therapy (estrogen) are also more likely to develop biliary colic. TREATMENT   Pain medication may be prescribed.  You may be encouraged to eat a fat-free diet.  If the first episode of biliary colic is severe, or episodes of colic keep retuning, surgery to remove the gallbladder (cholecystectomy) is usually recommended. This procedure can be done through small incisions using an instrument called a laparoscope. The procedure often requires a brief stay in the hospital. Some people can leave the hospital the same day. It is the most widely used treatment in people troubled by painful gallstones. It is effective and safe, with no complications in more than 90% of cases.  If surgery cannot be done, medication that dissolves gallstones may be used. This medication is expensive and can take months or years to work. Only small stones will dissolve.  Rarely, medication to dissolve gallstones is combined with a procedure called shock-wave lithotripsy. This procedure uses carefully aimed shock waves to break up gallstones. In many people treated with this procedure, gallstones form again within a few years. PROGNOSIS  If gallstones  block your cystic duct or common bile duct, you are at risk for repeated episodes of biliary colic. There is also a 25% chance that you will develop a gallbladder infection(acute cholecystitis), or some other complication of gallstones within 10 to 20 years. If you have surgery, schedule it at a time that is convenient for you and at a time when you are not sick. HOME CARE  INSTRUCTIONS   Drink plenty of clear fluids.  Avoid fatty, greasy or fried foods, or any foods that make your pain worse.  Take medications as directed. SEEK MEDICAL CARE IF:   You develop a fever over 100.5 F (38.1 C).  Your pain gets worse over time.  You develop nausea that prevents you from eating and drinking.  You develop vomiting. SEEK IMMEDIATE MEDICAL CARE IF:   You have continuous or severe belly (abdominal) pain which is not relieved with medications.  You develop nausea and vomiting which is not relieved with medications.  You have symptoms of biliary colic and you suddenly develop a fever and shaking chills. This may signal cholecystitis. Call your caregiver immediately.  You develop a yellow color to your skin or the white part of your eyes (jaundice). Document Released: 06/25/2005 Document Revised: 04/16/2011 Document Reviewed: 09/04/2007 [facility] Patient Information 2015 [place], [place]. This information is not intended to replace advice given to you by your health care provider. Make sure you discuss any questions you have with your health care provider.

## 2013-10-06 NOTE — ED Provider Notes (Signed)
Medical screening examination/treatment/procedure(s) were performed by non-physician practitioner and as supervising physician I was immediately available for consultation/collaboration.   EKG Interpretation   Date/Time:  Sunday October 04 2013 18:27:21 EDT Ventricular Rate:  85 PR Interval:  214 QRS Duration: 86 QT Interval:  374 QTC Calculation: 445 R Axis:   52 Text Interpretation:  Sinus rhythm with 1st degree A-V block Possible Left  atrial enlargement Borderline ECG ED PHYSICIAN INTERPRETATION AVAILABLE IN  [facility] Confirmed by TEST, Record ([id number]) on 10/06/2013 7:07:55 AM       Raeford Razor, MD 10/06/13 1451

## 2013-12-07 ENCOUNTER — Encounter (HOSPITAL_COMMUNITY): Payer: Self-pay | Admitting: Emergency Medicine

## 2013-12-29 ENCOUNTER — Ambulatory Visit (INDEPENDENT_AMBULATORY_CARE_PROVIDER_SITE_OTHER): Payer: Federal, State, Local not specified - PPO

## 2013-12-29 ENCOUNTER — Encounter: Payer: Self-pay | Admitting: Podiatry

## 2013-12-29 ENCOUNTER — Ambulatory Visit (INDEPENDENT_AMBULATORY_CARE_PROVIDER_SITE_OTHER): Payer: Federal, State, Local not specified - PPO | Admitting: Podiatry

## 2013-12-29 VITALS — BP 171/99 | HR 84 | Resp 16 | Ht 64.0 in | Wt 230.0 lb

## 2013-12-29 DIAGNOSIS — M722 Plantar fascial fibromatosis: Secondary | ICD-10-CM

## 2013-12-29 MED ORDER — MELOXICAM 15 MG PO TABS: 15.0000 mg | ORAL_TABLET | Freq: Every day | ORAL | Status: DC

## 2013-12-29 MED ORDER — METHYLPREDNISOLONE (PAK) 4 MG PO TABS: ORAL_TABLET | ORAL | Status: DC

## 2013-12-29 NOTE — Progress Notes (Signed)
   Subjective:    Patient ID: [name], female    DOB: 05/19/1965, 48 y.o.   MRN: [id number]  HPI Comments: Left foot pain, its been a constant pain since September. Left plantar heel pain   Foot Pain      Review of Systems  All other systems reviewed and are negative.      Objective:   Physical Exam: I have reviewed her past medical history medications allergy surgery social history and review of systems. Pulses are strongly palpable bilateral. Neurologic sensorium is intact for Semmes-Weinstein monofilament. Deep tendon reflexes are intact bilateral muscle strength +5 over 5 dorsiflexors plantar flexors and inverters everters all intrinsic musculature is intact. Orthopedic evaluation and straight all joints distal to the ankle for range of motion without crepitation. She does have pain on palpation medial calcaneal tubercle of the left heel. Radiographic evaluation demonstrates small plantar distally or any calcaneal heel spur with a soft tissue increase in density at the plantar fascial insertion site of the left heel.        Assessment & Plan:  Assessment: Plantar fasciitis left heel.  Plan: Discussed etiology pathology conservative versus surgical therapies. We discussed appropriate shoe gear stretching exercises ice therapy issue modifications. I injected her left heel today with Kenalog and local anesthetic. Wrote a prescription for a Medrol Dosepak to be followed by meloxicam. Put her in a plantar fascial brace and dispensed a plantar fascial night splint. I will follow up with her in 1 month and will consider orthotics at that time.

## 2013-12-29 NOTE — Patient Instructions (Signed)

## 2014-01-30 ENCOUNTER — Inpatient Hospital Stay (HOSPITAL_COMMUNITY): Payer: Federal, State, Local not specified - PPO

## 2014-01-30 ENCOUNTER — Encounter (HOSPITAL_COMMUNITY): Payer: Self-pay | Admitting: Adult Health

## 2014-01-30 ENCOUNTER — Inpatient Hospital Stay (HOSPITAL_COMMUNITY)
Admission: EM | Admit: 2014-01-30 | Discharge: 2014-02-02 | DRG: 418 | Disposition: A | Discharge status: Home / Self Care | Payer: Federal, State, Local not specified - PPO | Attending: Internal Medicine | Admitting: Internal Medicine

## 2014-01-30 ENCOUNTER — Emergency Department (HOSPITAL_COMMUNITY): Payer: Federal, State, Local not specified - PPO

## 2014-01-30 DIAGNOSIS — G4733 Obstructive sleep apnea (adult) (pediatric): Secondary | ICD-10-CM | POA: Diagnosis present

## 2014-01-30 DIAGNOSIS — E785 Hyperlipidemia, unspecified: Secondary | ICD-10-CM | POA: Diagnosis present

## 2014-01-30 DIAGNOSIS — E119 Type 2 diabetes mellitus without complications: Secondary | ICD-10-CM | POA: Diagnosis present

## 2014-01-30 DIAGNOSIS — R059 Cough, unspecified: Secondary | ICD-10-CM

## 2014-01-30 DIAGNOSIS — D509 Iron deficiency anemia, unspecified: Secondary | ICD-10-CM

## 2014-01-30 DIAGNOSIS — K8000 Calculus of gallbladder with acute cholecystitis without obstruction: Secondary | ICD-10-CM | POA: Diagnosis not present

## 2014-01-30 DIAGNOSIS — R109 Unspecified abdominal pain: Secondary | ICD-10-CM | POA: Diagnosis not present

## 2014-01-30 DIAGNOSIS — J45901 Unspecified asthma with (acute) exacerbation: Secondary | ICD-10-CM | POA: Diagnosis present

## 2014-01-30 DIAGNOSIS — I10 Essential (primary) hypertension: Secondary | ICD-10-CM | POA: Diagnosis present

## 2014-01-30 DIAGNOSIS — K819 Cholecystitis, unspecified: Secondary | ICD-10-CM | POA: Diagnosis present

## 2014-01-30 DIAGNOSIS — Z87891 Personal history of nicotine dependence: Secondary | ICD-10-CM | POA: Diagnosis not present

## 2014-01-30 DIAGNOSIS — R05 Cough: Secondary | ICD-10-CM

## 2014-01-30 DIAGNOSIS — K8050 Calculus of bile duct without cholangitis or cholecystitis without obstruction: Secondary | ICD-10-CM

## 2014-01-30 LAB — I-STAT TROPONIN, ED: TROPONIN I, POC: 0 ng/mL (ref 0.00–0.08)

## 2014-01-30 LAB — COMPREHENSIVE METABOLIC PANEL
ALK PHOS: 79 U/L (ref 39–117)
ALT: 16 U/L (ref 0–35)
AST: 26 U/L (ref 0–37)
Albumin: 3.8 g/dL (ref 3.5–5.2)
Anion gap: 6 (ref 5–15)
BUN: 11 mg/dL (ref 6–23)
CHLORIDE: 106 meq/L (ref 96–112)
CO2: 25 mmol/L (ref 19–32)
Calcium: 9.4 mg/dL (ref 8.4–10.5)
Creatinine, Ser: 0.81 mg/dL (ref 0.50–1.10)
GFR, EST NON AFRICAN AMERICAN: 86 mL/min — AB (ref >90)
GLUCOSE: 117 mg/dL — AB (ref 70–99)
POTASSIUM: 3.9 mmol/L (ref 3.5–5.1)
SODIUM: 137 mmol/L (ref 135–145)
Total Bilirubin: 0.3 mg/dL (ref 0.3–1.2)
Total Protein: 6.7 g/dL (ref 6.0–8.3)

## 2014-01-30 LAB — CBC WITH DIFFERENTIAL/PLATELET
BASOS ABS: 0 10*3/uL (ref 0.0–0.1)
Basophils Relative: 1 % (ref 0–1)
Eosinophils Absolute: 0.3 10*3/uL (ref 0.0–0.7)
Eosinophils Relative: 3 % (ref 0–5)
HCT: 35.1 % — ABNORMAL LOW (ref 36.0–46.0)
Hemoglobin: 11.4 g/dL — ABNORMAL LOW (ref 12.0–15.0)
LYMPHS ABS: 2.8 10*3/uL (ref 0.7–4.0)
LYMPHS PCT: 33 % (ref 12–46)
MCH: 23.2 pg — ABNORMAL LOW (ref 26.0–34.0)
MCHC: 32.5 g/dL (ref 30.0–36.0)
MCV: 71.5 fL — ABNORMAL LOW (ref 78.0–100.0)
Monocytes Absolute: 0.4 10*3/uL (ref 0.1–1.0)
Monocytes Relative: 5 % (ref 3–12)
NEUTROS ABS: 4.9 10*3/uL (ref 1.7–7.7)
NEUTROS PCT: 58 % (ref 43–77)
PLATELETS: 370 10*3/uL (ref 150–400)
RBC: 4.91 MIL/uL (ref 3.87–5.11)
RDW: 16.1 % — AB (ref 11.5–15.5)
WBC: 8.5 10*3/uL (ref 4.0–10.5)

## 2014-01-30 LAB — POC URINE PREG, ED: Preg Test, Ur: NEGATIVE

## 2014-01-30 LAB — LIPASE, BLOOD: Lipase: 32 U/L (ref 11–59)

## 2014-01-30 MED ORDER — FENTANYL CITRATE 0.05 MG/ML IJ SOLN
50.0000 ug | Freq: Once | INTRAMUSCULAR | Status: AC
  Administered 2014-01-30: 50 ug via INTRAVENOUS

## 2014-01-30 MED ORDER — ONDANSETRON HCL 4 MG/2ML IJ SOLN: 4.0000 mg | Freq: Once | INTRAMUSCULAR | Status: DC

## 2014-01-30 MED ORDER — FENTANYL CITRATE 0.05 MG/ML IJ SOLN
INTRAMUSCULAR | Status: AC
  Filled 2014-01-30: qty 2

## 2014-01-30 MED ORDER — CIPROFLOXACIN IN D5W 400 MG/200ML IV SOLN
400.0000 mg | Freq: Once | INTRAVENOUS | Status: AC
  Administered 2014-01-30: 400 mg via INTRAVENOUS
  Filled 2014-01-30: qty 200

## 2014-01-30 MED ORDER — ALBUTEROL SULFATE (2.5 MG/3ML) 0.083% IN NEBU
5.0000 mg | INHALATION_SOLUTION | Freq: Once | RESPIRATORY_TRACT | Status: AC
  Administered 2014-01-30: 5 mg via RESPIRATORY_TRACT
  Filled 2014-01-30: qty 6

## 2014-01-30 MED ORDER — HYDROMORPHONE HCL 1 MG/ML IJ SOLN
1.0000 mg | Freq: Once | INTRAMUSCULAR | Status: AC
  Administered 2014-01-30: 1 mg via INTRAVENOUS
  Filled 2014-01-30: qty 1

## 2014-01-30 MED ORDER — SODIUM CHLORIDE 0.9 % IV SOLN
INTRAVENOUS | Status: AC
  Administered 2014-01-30: 23:00:00 via INTRAVENOUS

## 2014-01-30 MED ORDER — METRONIDAZOLE IN NACL 5-0.79 MG/ML-% IV SOLN
500.0000 mg | Freq: Once | INTRAVENOUS | Status: AC
  Administered 2014-01-30: 500 mg via INTRAVENOUS
  Filled 2014-01-30: qty 100

## 2014-01-30 NOTE — ED Provider Notes (Signed)
CSN: [id number]     Arrival date & time 01/30/14  1615 History   First MD Initiated Contact with Patient 01/30/14 1709     Chief Complaint  Patient presents with  . Abdominal Pain     (Consider location/radiation/quality/duration/timing/severity/associated sxs/prior Treatment) HPI  Pt presents with c/o right upper abdominal pain.  Pt states she has hx of gallstones and her pain feels similar to prior exacerbation.  No vomiting but she is feeling very nauseated.  Symptoms began today after eating ice cream.  Pain is constant and severe.  No fever/chills.  Pt has not had f/u with surgery for her gallstones- she has not had symptoms since the first episode which was diagnostic.  There are no other associated systemic symptoms, there are no other alleviating or modifying factors.   Past Medical History  Diagnosis Date  . Asthma   . Hypertension   . Diabetes mellitus   . Heart murmur     mild TR  . Hyperlipidemia   . Cushing syndrome     ?-pt states she was given this diagnosis during a hospitalization.  Records reviewed, no mention of dx or treatment  . Allergy    Past Surgical History  Procedure Laterality Date  . Tubal ligation  01/10/2000  . Cervical conization w/bx  1999   Family History  Problem Relation Age of Onset  . Thyroid disease Sister   . Hyperlipidemia Mother   . Heart murmur Daughter   . Heart murmur Son   . Diabetes Neg Hx   . Heart murmur Daughter   . Asthma Daughter   . Cancer Cousin [id number]    female cousin--unknown primary, metastatic  . Pulmonary embolism Daughter [phone]  . Hypertension Father    History  Substance Use Topics  . Smoking status: Former Smoker -- 0.25 packs/day    Types: Cigarettes    Quit date: 02/21/2012  . Smokeless tobacco: Never Used  . Alcohol Use: No   OB History    Gravida Para Term Preterm AB TAB SAB Ectopic Multiple Living   5 3   2 1 1   3      Review of Systems  ROS reviewed and all otherwise negative except for mentioned in  HPI    Allergies  Bactrim; Doxycycline; and Rocephin  Home Medications   Prior to Admission medications   Medication Sig Start Date End Date Taking? Authorizing Provider  albuterol (PROVENTIL HFA;VENTOLIN HFA) 108 (90 BASE) MCG/ACT inhaler Inhale 2 puffs into the lungs every 4 (four) hours as needed for wheezing or shortness of breath.   Yes Historical Provider, MD  ibuprofen (ADVIL,MOTRIN) 200 MG tablet Take 200 mg by mouth every 6 (six) hours as needed (pain).   Yes Historical Provider, MD  ondansetron (ZOFRAN) 8 MG tablet Take 8 mg by mouth every 8 (eight) hours as needed for nausea or vomiting.   Yes Historical Provider, MD  meloxicam (MOBIC) 15 MG tablet Take 1 tablet (15 mg total) by mouth daily. Patient not taking: Reported on 01/30/2014 12/29/13   Max T Hyatt, DPM  methylPREDNIsolone (MEDROL DOSPACK) 4 MG tablet 6 day tapering dose-follow package instructions Patient not taking: Reported on 01/30/2014 12/29/13   Max T Hyatt, DPM   BP 134/82 mmHg  Pulse 80  Temp(Src) 98.1 F (36.7 C) (Oral)  Resp 16  SpO2 97%  LMP 01/23/2014  Vitals reviewed Physical Exam  Physical Examination: General appearance - alert, well appearing, and in no distress Mental status - alert, oriented  to person, place, and time Eyes - no conjunctival injection, no scleral icterus Mouth - mucous membranes moist, pharynx normal without lesions Chest - clear to auscultation, no wheezes, rales or rhonchi, symmetric air entry Heart - normal rate, regular rhythm, normal S1, S2, no murmurs, rubs, clicks or gallops Abdomen - soft, ttp in right upper abdomen, no gaurding or rebound tenderness, nondistended, no masses or organomegaly, nabs Extremities - peripheral pulses normal, no pedal edema, no clubbing or cyanosis Skin - normal coloration and turgor, no rashes  ED Course  Procedures (including critical care time)  10:05 PM d/w Dr. Andrey CampanileWilson, surgery, he recommends medicine admission with surgery consult.  He  advises rocephin for mild cholecystitis.  Pt is allergic to rocephin.  Will treat with cipro/flagyl based on up to date recommendations.    10:30 PM d/w Dr. Vinie SillMullens, [facility] for [facility].  Pt will go to med/surg bed.   Labs Review Labs Reviewed  CBC WITH DIFFERENTIAL - Abnormal; Notable for the following:    Hemoglobin 11.4 (*)    HCT 35.1 (*)    MCV 71.5 (*)    MCH 23.2 (*)    RDW 16.1 (*)    All other components within normal limits  COMPREHENSIVE METABOLIC PANEL - Abnormal; Notable for the following:    Glucose, Bld 117 (*)    GFR calc non Af Amer 86 (*)    All other components within normal limits  CBC - Abnormal; Notable for the following:    Hemoglobin 11.3 (*)    HCT 34.4 (*)    MCV 71.5 (*)    MCH 23.5 (*)    RDW 16.1 (*)    All other components within normal limits  HEMOGLOBIN A1C - Abnormal; Notable for the following:    Hgb A1c MFr Bld 6.2 (*)    Mean Plasma Glucose 131 (*)    All other components within normal limits  GLUCOSE, CAPILLARY - Abnormal; Notable for the following:    Glucose-Capillary 124 (*)    All other components within normal limits  GLUCOSE, CAPILLARY - Abnormal; Notable for the following:    Glucose-Capillary 111 (*)    All other components within normal limits  GLUCOSE, CAPILLARY - Abnormal; Notable for the following:    Glucose-Capillary 103 (*)    All other components within normal limits  GLUCOSE, CAPILLARY - Abnormal; Notable for the following:    Glucose-Capillary 182 (*)    All other components within normal limits  GLUCOSE, CAPILLARY - Abnormal; Notable for the following:    Glucose-Capillary 150 (*)    All other components within normal limits  GLUCOSE, CAPILLARY - Abnormal; Notable for the following:    Glucose-Capillary 123 (*)    All other components within normal limits  SURGICAL PCR SCREEN  LIPASE, BLOOD  COMPREHENSIVE METABOLIC PANEL  PROTIME-INR  VITAMIN B12  FOLATE  FERRITIN  RETICULOCYTES  IRON AND TIBC  CBC  BASIC  METABOLIC PANEL  I-STAT TROPOININ, ED  POC URINE PREG, ED  SURGICAL PATHOLOGY    Imaging Review Dg Chest 2 View  01/30/2014   CLINICAL DATA:  Subacute onset of cough and wheezing for 3 weeks. Current history of moderate asthma. Initial encounter.  EXAM: CHEST  2 VIEW  COMPARISON:  Chest radiograph performed 10/04/2013  FINDINGS: The lungs are well-aerated. Mild peribronchial thickening is noted. There is no evidence of focal opacification, pleural effusion or pneumothorax.  The heart is borderline normal in size; the mediastinal contour is within normal limits. No  acute osseous abnormalities are seen.  IMPRESSION: Mild peribronchial thickening may reflect the patient's history of asthma. Lungs otherwise clear.   Electronically Signed   By: Roanna Raider M.D.   On: 01/30/2014 23:58   US Abdomen Limited  01/30/2014   CLINICAL DATA:  48 year old female with biliary colic, cholelithiasis. Upper abdominal pain for 1 day with nausea. Initial encounter.  EXAM: US ABDOMEN LIMITED - RIGHT UPPER QUADRANT  COMPARISON:  Ultrasound 10/04/2013.  FINDINGS: Gallbladder:  Numerous small layering gallstones (image 3). Individual stones are up to 4 mm diameter. There is new gallbladder wall thickening up to 5 mm (image 12). Positive sonographic Murphy sign.  Common bile duct:  Diameter: 3 mm, normal  Liver:  Echogenicity at the upper limits of normal. Mildly heterogeneous echotexture throughout. No intrahepatic biliary ductal dilatation. No discrete liver lesion identified. Other findings: Negative visible right kidney.  IMPRESSION: 1. Chronic cholelithiasis with new abnormal gallbladder wall thickening, and sonographic Murphy sign, compatible with Acute Cholecystitis. 2. No evidence of biliary obstruction.   Electronically Signed   By: Augusto Gamble M.D.   On: 01/30/2014 20:03     EKG Interpretation   Date/Time:  Saturday January 30 2014 16:22:40 EST Ventricular Rate:  94 PR Interval:  210 QRS Duration: 84 QT  Interval:  364 QTC Calculation: 455 R Axis:   48 Text Interpretation:  Sinus rhythm with 1st degree A-V block Otherwise  normal ECG No significant change since last tracing Confirmed by [facility]   MD, Sherlyn Ebbert [phone]) on 01/31/2014 11:18:24 PM      MDM   Final diagnoses:  Biliary colic  acute cholecystitis adbominal pain  Pt presnting with c/o right upper abdominal pain- hx of gallstones- ultrasound today shows acute cholecystitis.  Pt treated with IV pain and nausea meds, c/w surgery as above- admitted to medicine.      Ethelda Chick, MD 01/31/14 [phone]

## 2014-01-30 NOTE — ED Notes (Signed)
Pt to US at this time.

## 2014-01-30 NOTE — H&P (Signed)
[facility] History and Physical  Jocelyn FickConstance R Grant [id number] DOB: 09/29/1965 DOA: 01/30/2014  Referring physician: ED physician PCP: Carollee HerterLALONDE,JOHN CHARLES, MD   Chief Complaint: RUQ pain  HPI:  Ms. Manson PasseyBrown is a 48yo AA woman with PMH of asthma, HTN (diet controlled), DM2 (diet controlled), HLD (used to take red yeast rice, but no longer), cushing syndrome (told by MD she had this a long time ago) OSA on CPAP who presents with RUQ pain that started this afternoon after eating some ice cream.  The patient had a similar event in August of this year and presented to the ED.  She was found at that time to have cholelithiasis without cholecystitis and was advised that she did not need surgery at that time (per the patient).  She presents today with burning RUQ pain, nausea and decreased appetite.  She has had no fever, chills, vomiting, weight loss, jaundice.  An ultrasound showed a + Murphy sign and cholecystitis.  Surgery was consulted by the ED.    She also complains of a few weeks worth of cough and wheezing.  She notes a history of asthma for which she has not needed treatment for years.  However, with the start of the holiday season, she was working in a new building with the post office, and has begun having a cough and wheezing.  She notes tonight that her chest is tight, she has a cough and she is wheezing despite a breathing treatment.  She has also had to start using her inhaler again.  She denies chest pain, swelling of her lower extremities, PND.  She does have some difficulty lying flat.    Assessment and Plan: Principal Problem:    Cholecystitis - Seen on Ultrasound - Surgery is consulted and apparently plan for cholecystectomy in the AM - NS at 125cc/hr - NPO p MN - CPAP post surgery and for sleep - Follow CMET in the morning, but this is surprisingly normal currently - Cipro/flaygl as patient is allergic to rocephin  Active Problems:  Asthma exacerbation, mild -  Patient with history of asthma, but recent exposure causing a likely exacerbation - Will treat with nebulizers as needed and a short steroid course - No PFTs noted in our system, but patient with history from quite a while ago - CXR with mild peribronchial thickening, possibly bronchitis related to asthma    HTN (hypertension) - Currently not on any medication and reports that she has not needed any for some time - Currently hypertensive to 170s/90s - Will start PRN hydralazine for SBP > 180 - She may need medication in the outpatient setting    Diabetes mellitus - Reported as diet controlled - Check A1C - SSI at moderate dose while inpatient    Hyperlipidemia - Defer management to outpatient, currently not on any medications  Microcytic Anemia: Appears chronic in nature, dating back to 2008.   - Will check anemia panel - Possible need for iron supplementation     Radiological Exams on Admission: Dg Chest 2 View  01/30/2014   CLINICAL DATA:  Subacute onset of cough and wheezing for 3 weeks. Current history of moderate asthma. Initial encounter.  EXAM: CHEST  2 VIEW  COMPARISON:  Chest radiograph performed 10/04/2013  FINDINGS: The lungs are well-aerated. Mild peribronchial thickening is noted. There is no evidence of focal opacification, pleural effusion or pneumothorax.  The heart is borderline normal in size; the mediastinal contour is within normal limits. No acute osseous abnormalities are seen.  IMPRESSION: Mild peribronchial thickening may reflect the patient's history of asthma. Lungs otherwise clear.   Electronically Signed   By: Roanna Raider M.D.   On: 01/30/2014 23:58   US Abdomen Limited  01/30/2014   CLINICAL DATA:  48 year old female with biliary colic, cholelithiasis. Upper abdominal pain for 1 day with nausea. Initial encounter.  EXAM: US ABDOMEN LIMITED - RIGHT UPPER QUADRANT  COMPARISON:  Ultrasound 10/04/2013.  FINDINGS: Gallbladder:  Numerous small layering  gallstones (image 3). Individual stones are up to 4 mm diameter. There is new gallbladder wall thickening up to 5 mm (image 12). Positive sonographic Murphy sign.  Common bile duct:  Diameter: 3 mm, normal  Liver:  Echogenicity at the upper limits of normal. Mildly heterogeneous echotexture throughout. No intrahepatic biliary ductal dilatation. No discrete liver lesion identified. Other findings: Negative visible right kidney.  IMPRESSION: 1. Chronic cholelithiasis with new abnormal gallbladder wall thickening, and sonographic Murphy sign, compatible with Acute Cholecystitis. 2. No evidence of biliary obstruction.   Electronically Signed   By: Augusto Gamble M.D.   On: 01/30/2014 20:03   Code Status: Full Family Communication: Pt and husband at bedside Disposition Plan: Admit for further evaluation     Review of Systems:  Constitutional: Negative for fever, chills and malaise/fatigue. Negative for diaphoresis.  HENT: Negative for hearing loss, ear pain, nosebleeds, congestion, sore throat, neck pain Eyes: Negative for blurred vision, double vision, photophobia Respiratory: + for cough, wheezing. Negative for hemoptysis, sputum production, shortness of breath, stridor.   Cardiovascular: Negative for chest pain, palpitations, orthopnea, claudication and leg swelling.  Gastrointestinal: + for nausea, RUQ pain, heartburn and constipation. Negative for vomiting, blood in stool and melena.  Genitourinary: Negative for dysuria, urgency, frequency Musculoskeletal: Negative for myalgias, back pain, joint pain and falls.  Skin: Negative for itching and rash.  Neurological: Negative for dizziness and weakness. Negative for tingling, tremors, sensory change, speech change, focal weakness, loss of consciousness and headaches.  Endo/Heme/Allergies: + for environmental allergies  Psychiatric/Behavioral:  The patient is not nervous/anxious.     Reviewed with patient Past Medical History  Diagnosis Date  . Asthma    . Hypertension   . Diabetes mellitus   . Heart murmur     mild TR  . Hyperlipidemia   . Cushing syndrome     ?-pt states she was given this diagnosis during a hospitalization.  Records reviewed, no mention of dx or treatment  . Allergy     Past Surgical History  Procedure Laterality Date  . Tubal ligation  01/10/2000  . Cervical conization w/bx  1999    Social History:  reports that she quit smoking about 1 years ago. Her smoking use included Cigarettes. She smoked 0.25 packs per day. She has never used smokeless tobacco. She reports that she does not drink alcohol or use illicit drugs.  Allergies  Allergen Reactions  . Bactrim [Sulfamethoxazole-Trimethoprim]     Lip swelling, mouth itching  . Doxycycline     Severe headache  . Rocephin [Ceftriaxone Sodium] Hives, Itching and Other (See Comments)    wheezing    Family History  Problem Relation Age of Onset  . Thyroid disease Sister   . Hyperlipidemia Mother   . Heart murmur Daughter   . Heart murmur Son   . Diabetes Neg Hx   . Heart murmur Daughter   . Asthma Daughter   . Cancer Cousin 33    female cousin--unknown primary, metastatic  . Pulmonary embolism Daughter 8  .  Hypertension Father     Prior to Admission medications   Medication Sig Start Date End Date Taking? Authorizing Provider  albuterol (PROVENTIL HFA;VENTOLIN HFA) 108 (90 BASE) MCG/ACT inhaler Inhale 2 puffs into the lungs every 4 (four) hours as needed for wheezing or shortness of breath.   Yes Historical Provider, MD  ibuprofen (ADVIL,MOTRIN) 200 MG tablet Take 200 mg by mouth every 6 (six) hours as needed (pain).   Yes Historical Provider, MD  ondansetron (ZOFRAN) 8 MG tablet Take 8 mg by mouth every 8 (eight) hours as needed for nausea or vomiting.   Yes Historical Provider, MD  meloxicam (MOBIC) 15 MG tablet Take 1 tablet (15 mg total) by mouth daily. Patient not taking: Reported on 01/30/2014 12/29/13   Max T Hyatt, DPM  methylPREDNIsolone  (MEDROL DOSPACK) 4 MG tablet 6 day tapering dose-follow package instructions Patient not taking: Reported on 01/30/2014 12/29/13   Elinor ParkinsonMax T Hyatt, DPM    Physical Exam: Filed Vitals:   01/30/14 2200 01/30/14 2230 01/30/14 2300 01/31/14 0010  BP: 148/85 167/82 156/90 171/90  Pulse: 75  73 71  Temp:    98.1 F (36.7 C)  TempSrc:    Oral  Resp: 18 15 21 20   SpO2: 93% 100% 98% 98%    Physical Exam  Constitutional: Appears well-developed and well-nourished. No distress.  HENT: Normocephalic. Oropharynx is clear and moist.  Eyes: Conjunctivae normal. PERRLA, no scleral icterus.  Neck: Supple, no JVD CVS: RR, NR, S1/S2 +, no murmurs Pulmonary: No increased WOB, + expiratory wheezing on exam, no rales or rhonchi  Abdominal: Soft. BS +,  no distension, + ttp in RUQ, no rebound noted  Musculoskeletal:  No edema and no tenderness.  Neuro: Alert. No cranial nerve deficit. Skin: Skin is warm and dry. No rash noted.   Psychiatric: Normal mood and affect. Behavior, judgment, thought content normal.   Labs on Admission:  Basic Metabolic Panel:  Recent Labs Lab 01/30/14 1647  NA 137  K 3.9  CL 106  CO2 25  GLUCOSE 117*  BUN 11  CREATININE 0.81  CALCIUM 9.4   Liver Function Tests:  Recent Labs Lab 01/30/14 1647  AST 26  ALT 16  ALKPHOS 79  BILITOT 0.3  PROT 6.7  ALBUMIN 3.8    Recent Labs Lab 01/30/14 1647  LIPASE 32   CBC:  Recent Labs Lab 01/30/14 1647  WBC 8.5  NEUTROABS 4.9  HGB 11.4*  HCT 35.1*  MCV 71.5*  PLT 370   EKG: Normal sinus rhythm, no ST/T wave changes, 1st degree AV block noted  Debe CoderMULLEN, [place] Dube, MD  [facility] Pager [phone]  If 7PM-7AM, please contact night-coverage [email or web address] Password [email or web address] 01/31/2014, 12:20 AM

## 2014-01-30 NOTE — ED Notes (Signed)
Presents with severe upper abdominal pain began today-Hx of gallstones, feels similair. Pain began after eating icecream  Associated with nausea took zofran with no relief of nauseas. Pain is constant and severe. IV started and 50 mcg of fentanyl given at triage.

## 2014-01-30 NOTE — Consult Note (Signed)
Reason for Consult:cholecystitis Referring Physician: Dr Seward Grater is an 48 y.o. female.  HPI: 48 yo AAF came to ED tonight for persistent RUQ pain that began at around 1pm today. It was constant, severe burning pain. No radiation. Similar to prior episodes but felt a little bit difft than prior episodes. Has had 1 previous trip to ED for it and several mild attacks. Feels like a fire and it is contracting. +nausea and chills; no fever. No wt loss, jaundice, etoh, prior abd surgery except for BTL. Doesn't take any meds for BP. Uses CPAP.   Past Medical History  Diagnosis Date  . Asthma   . Hypertension   . Diabetes mellitus   . Heart murmur     mild TR  . Hyperlipidemia   . Cushing syndrome     ?-pt states she was given this diagnosis during a hospitalization.  Records reviewed, no mention of dx or treatment  . Allergy     Past Surgical History  Procedure Laterality Date  . Tubal ligation  01/10/2000  . Cervical conization w/bx  1999    Family History  Problem Relation Age of Onset  . Thyroid disease Sister   . Hyperlipidemia Mother   . Heart murmur Daughter   . Heart murmur Son   . Diabetes Neg Hx   . Heart murmur Daughter   . Asthma Daughter   . Cancer Cousin 14    female cousin--unknown primary, metastatic  . Pulmonary embolism Daughter 53  . Hypertension Father     Social History:  reports that she quit smoking about 23 months ago. Her smoking use included Cigarettes. She smoked 0.25 packs per day. She has never used smokeless tobacco. She reports that she does not drink alcohol or use illicit drugs.  Allergies:  Allergies  Allergen Reactions  . Bactrim [Sulfamethoxazole-Trimethoprim]     Lip swelling, mouth itching  . Doxycycline     Severe headache  . Rocephin [Ceftriaxone Sodium] Hives, Itching and Other (See Comments)    wheezing    Medications: I have reviewed the patient's current medications.  Results for orders placed or performed  during the hospital encounter of 01/30/14 (from the past 48 hour(s))  CBC with Differential     Status: Abnormal   Collection Time: 01/30/14  4:47 PM  Result Value Ref Range   WBC 8.5 4.0 - 10.5 K/uL   RBC 4.91 3.87 - 5.11 MIL/uL   Hemoglobin 11.4 (L) 12.0 - 15.0 g/dL   HCT 35.1 (L) 36.0 - 46.0 %   MCV 71.5 (L) 78.0 - 100.0 fL   MCH 23.2 (L) 26.0 - 34.0 pg   MCHC 32.5 30.0 - 36.0 g/dL   RDW 16.1 (H) 11.5 - 15.5 %   Platelets 370 150 - 400 K/uL   Neutrophils Relative % 58 43 - 77 %   Neutro Abs 4.9 1.7 - 7.7 K/uL   Lymphocytes Relative 33 12 - 46 %   Lymphs Abs 2.8 0.7 - 4.0 K/uL   Monocytes Relative 5 3 - 12 %   Monocytes Absolute 0.4 0.1 - 1.0 K/uL   Eosinophils Relative 3 0 - 5 %   Eosinophils Absolute 0.3 0.0 - 0.7 K/uL   Basophils Relative 1 0 - 1 %   Basophils Absolute 0.0 0.0 - 0.1 K/uL  Comprehensive metabolic panel     Status: Abnormal   Collection Time: 01/30/14  4:47 PM  Result Value Ref Range   Sodium 137 135 -  145 mmol/L    Comment: Please note change in reference range.   Potassium 3.9 3.5 - 5.1 mmol/L    Comment: Please note change in reference range.   Chloride 106 96 - 112 mEq/L   CO2 25 19 - 32 mmol/L   Glucose, Bld 117 (H) 70 - 99 mg/dL   BUN 11 6 - 23 mg/dL   Creatinine, Ser 0.81 0.50 - 1.10 mg/dL   Calcium 9.4 8.4 - 10.5 mg/dL   Total Protein 6.7 6.0 - 8.3 g/dL   Albumin 3.8 3.5 - 5.2 g/dL   AST 26 0 - 37 U/L   ALT 16 0 - 35 U/L   Alkaline Phosphatase 79 39 - 117 U/L   Total Bilirubin 0.3 0.3 - 1.2 mg/dL   GFR calc non Af Amer 86 (L) >90 mL/min   GFR calc Af Amer >90 >90 mL/min    Comment: (NOTE) The eGFR has been calculated using the CKD EPI equation. This calculation has not been validated in all clinical situations. eGFR's persistently <90 mL/min signify possible Chronic Kidney Disease.    Anion gap 6 5 - 15  Lipase, blood     Status: None   Collection Time: 01/30/14  4:47 PM  Result Value Ref Range   Lipase 32 11 - 59 U/L  I-stat  troponin, ED (only if pt is 45 y.o. or older & pain is above umbilicus) - do not order at [facility]     Status: None   Collection Time: 01/30/14  4:53 PM  Result Value Ref Range   Troponin i, poc 0.00 0.00 - 0.08 ng/mL   Comment 3            Comment: Due to the release kinetics of cTnI, a negative result within the first hours of the onset of symptoms does not rule out myocardial infarction with certainty. If myocardial infarction is still suspected, repeat the test at appropriate intervals.   POC Urine Pregnancy, ED  (If Pre-menopausal female) - do not order at [facility]     Status: None   Collection Time: 01/30/14  5:52 PM  Result Value Ref Range   Preg Test, Ur NEGATIVE NEGATIVE    Comment:        THE SENSITIVITY OF THIS METHODOLOGY IS >24 mIU/mL     US Abdomen Limited  01/30/2014   CLINICAL DATA:  47 year old female with biliary colic, cholelithiasis. Upper abdominal pain for 1 day with nausea. Initial encounter.  EXAM: US ABDOMEN LIMITED - RIGHT UPPER QUADRANT  COMPARISON:  Ultrasound 10/04/2013.  FINDINGS: Gallbladder:  Numerous small layering gallstones (image 3). Individual stones are up to 4 mm diameter. There is new gallbladder wall thickening up to 5 mm (image 12). Positive sonographic Murphy sign.  Common bile duct:  Diameter: 3 mm, normal  Liver:  Echogenicity at the upper limits of normal. Mildly heterogeneous echotexture throughout. No intrahepatic biliary ductal dilatation. No discrete liver lesion identified. Other findings: Negative visible right kidney.  IMPRESSION: 1. Chronic cholelithiasis with new abnormal gallbladder wall thickening, and sonographic Murphy sign, compatible with Acute Cholecystitis. 2. No evidence of biliary obstruction.   Electronically Signed   By: Lars Pinks M.D.   On: 01/30/2014 20:03    Review of Systems  Constitutional: Positive for chills. Negative for weight loss.  HENT: Negative for nosebleeds.   Eyes: Negative for blurred vision.  Respiratory:  Positive for cough (3 weeks). Negative for sputum production and shortness of breath.  Has asthma; +OSA on CPAP  Cardiovascular: Negative for chest pain, palpitations, orthopnea and PND.       Denies DOE; denies BP issues  Genitourinary: Negative for dysuria and hematuria.  Musculoskeletal: Negative.        Wears boot at night for plantar fasciitis  Skin: Negative for itching and rash.  Neurological: Negative for dizziness, focal weakness, seizures, loss of consciousness and headaches.       Denies TIAs, amaurosis fugax  Endo/Heme/Allergies: Does not bruise/bleed easily.       States her a1c is 6  Psychiatric/Behavioral: The patient is not nervous/anxious.    Blood pressure 148/85, pulse 75, temperature 98.1 F (36.7 C), temperature source Oral, resp. rate 18, last menstrual period 01/23/2014, SpO2 93 %. Physical Exam  Vitals reviewed. Constitutional: She is oriented to person, place, and time. She appears well-developed and well-nourished.  Non-toxic appearance. She does not have a sickly appearance. She appears ill. No distress.  HENT:  Head: Normocephalic and atraumatic.  Right Ear: External ear normal.  Left Ear: External ear normal.  Eyes: Conjunctivae are normal. No scleral icterus.  Neck: Normal range of motion. Neck supple. No tracheal deviation present. No thyromegaly present.  Cardiovascular: Normal rate and normal heart sounds.   Respiratory: Effort normal and breath sounds normal. No stridor. No respiratory distress. She has no wheezes (?mild exp wheeze).  GI: Soft. She exhibits no distension. There is tenderness. There is no rigidity, no rebound and no guarding.    Musculoskeletal: She exhibits no edema or tenderness.  Lymphadenopathy:    She has no cervical adenopathy.  Neurological: She is alert and oriented to person, place, and time. She exhibits normal muscle tone.  Skin: Skin is warm and dry. No rash noted. She is not diaphoretic. No erythema. No pallor.    Psychiatric: She has a normal mood and affect. Her behavior is normal. Judgment and thought content normal.    Assessment/Plan: Acute calculous cholecystitis DM HTN Asthma  OSA on CPAP Plantar fasciitis  rec checking CXR given 3 week h/o cough.  rec medicine admit given BPs tonight; suspect pt needs to be on a BP med Discussed GB disease and cholecystectomy.  Tentative plan Lap Cholecystectomy in AM Cont ABX - rocephin should be sufficient CPAP SSI  Leighton Ruff. Redmond Pulling, MD, FACS General, Bariatric, & Minimally Invasive Surgery [facility] Surgery, [place]   [facility] 01/30/2014, 10:18 PM

## 2014-01-30 NOTE — ED Notes (Signed)
Attempted report x1. 

## 2014-01-31 ENCOUNTER — Inpatient Hospital Stay (HOSPITAL_COMMUNITY): Payer: Federal, State, Local not specified - PPO | Admitting: Anesthesiology

## 2014-01-31 ENCOUNTER — Encounter (HOSPITAL_COMMUNITY): Payer: Self-pay | Admitting: *Deleted

## 2014-01-31 ENCOUNTER — Inpatient Hospital Stay: Admit: 2014-01-31 | Payer: Federal, State, Local not specified - PPO | Admitting: Surgery

## 2014-01-31 ENCOUNTER — Encounter (HOSPITAL_COMMUNITY)
Admission: EM | Disposition: A | Discharge status: Home / Self Care | Payer: Self-pay | Source: Home / Self Care | Attending: Internal Medicine

## 2014-01-31 DIAGNOSIS — J45901 Unspecified asthma with (acute) exacerbation: Secondary | ICD-10-CM | POA: Diagnosis present

## 2014-01-31 DIAGNOSIS — E119 Type 2 diabetes mellitus without complications: Secondary | ICD-10-CM

## 2014-01-31 DIAGNOSIS — I10 Essential (primary) hypertension: Secondary | ICD-10-CM

## 2014-01-31 DIAGNOSIS — K819 Cholecystitis, unspecified: Secondary | ICD-10-CM

## 2014-01-31 HISTORY — PX: CHOLECYSTECTOMY: SHX55

## 2014-01-31 HISTORY — PX: GALLBLADDER SURGERY: SHX652

## 2014-01-31 LAB — COMPREHENSIVE METABOLIC PANEL
ALK PHOS: 72 U/L (ref 39–117)
ALT: 13 U/L (ref 0–35)
AST: 21 U/L (ref 0–37)
Albumin: 3.5 g/dL (ref 3.5–5.2)
Anion gap: 7 (ref 5–15)
BUN: 8 mg/dL (ref 6–23)
CALCIUM: 8.7 mg/dL (ref 8.4–10.5)
CO2: 23 mmol/L (ref 19–32)
Chloride: 107 mEq/L (ref 96–112)
Creatinine, Ser: 0.78 mg/dL (ref 0.50–1.10)
GFR calc non Af Amer: >90 mL/min (ref >90)
GLUCOSE: 92 mg/dL (ref 70–99)
Potassium: 3.7 mmol/L (ref 3.5–5.1)
Sodium: 137 mmol/L (ref 135–145)
Total Bilirubin: 0.3 mg/dL (ref 0.3–1.2)
Total Protein: 6.6 g/dL (ref 6.0–8.3)

## 2014-01-31 LAB — IRON AND TIBC
Iron: 50 ug/dL (ref 42–145)
SATURATION RATIOS: 16 % — AB (ref 20–55)
TIBC: 315 ug/dL (ref 250–470)
UIBC: 265 ug/dL (ref 125–400)

## 2014-01-31 LAB — GLUCOSE, CAPILLARY
GLUCOSE-CAPILLARY: 182 mg/dL — AB (ref 70–99)
GLUCOSE-CAPILLARY: 123 mg/dL — AB (ref 70–99)
Glucose-Capillary: 103 mg/dL — ABNORMAL HIGH (ref 70–99)
Glucose-Capillary: 111 mg/dL — ABNORMAL HIGH (ref 70–99)
Glucose-Capillary: 124 mg/dL — ABNORMAL HIGH (ref 70–99)
Glucose-Capillary: 150 mg/dL — ABNORMAL HIGH (ref 70–99)

## 2014-01-31 LAB — CBC
HCT: 34.4 % — ABNORMAL LOW (ref 36.0–46.0)
Hemoglobin: 11.3 g/dL — ABNORMAL LOW (ref 12.0–15.0)
MCH: 23.5 pg — AB (ref 26.0–34.0)
MCHC: 32.8 g/dL (ref 30.0–36.0)
MCV: 71.5 fL — ABNORMAL LOW (ref 78.0–100.0)
PLATELETS: 323 10*3/uL (ref 150–400)
RBC: 4.81 MIL/uL (ref 3.87–5.11)
RDW: 16.1 % — ABNORMAL HIGH (ref 11.5–15.5)
WBC: 8.3 10*3/uL (ref 4.0–10.5)

## 2014-01-31 LAB — VITAMIN B12: Vitamin B-12: 330 pg/mL (ref 211–911)

## 2014-01-31 LAB — FERRITIN: FERRITIN: 14 ng/mL (ref 10–291)

## 2014-01-31 LAB — RETICULOCYTES
RBC.: 4.87 MIL/uL (ref 3.87–5.11)
RETIC COUNT ABSOLUTE: 63.3 10*3/uL (ref 19.0–186.0)
Retic Ct Pct: 1.3 % (ref 0.4–3.1)

## 2014-01-31 LAB — FOLATE: Folate: 11.8 ng/mL

## 2014-01-31 LAB — HEMOGLOBIN A1C
Hgb A1c MFr Bld: 6.2 % — ABNORMAL HIGH (ref <5.7)
Mean Plasma Glucose: 131 mg/dL — ABNORMAL HIGH (ref <117)

## 2014-01-31 LAB — SURGICAL PCR SCREEN
MRSA, PCR: NEGATIVE
STAPHYLOCOCCUS AUREUS: NEGATIVE

## 2014-01-31 LAB — PROTIME-INR
INR: 1.09 (ref 0.00–1.49)
PROTHROMBIN TIME: 14.2 s (ref 11.6–15.2)

## 2014-01-31 SURGERY — LAPAROSCOPIC CHOLECYSTECTOMY WITH INTRAOPERATIVE CHOLANGIOGRAM
Anesthesia: General | Site: Abdomen

## 2014-01-31 MED ORDER — INSULIN ASPART 100 UNIT/ML ~~LOC~~ SOLN
0.0000 [IU] | SUBCUTANEOUS | Status: DC
  Administered 2014-01-31 (×3): 2 [IU] via SUBCUTANEOUS
  Administered 2014-02-01 (×4): 3 [IU] via SUBCUTANEOUS

## 2014-01-31 MED ORDER — SENNOSIDES-DOCUSATE SODIUM 8.6-50 MG PO TABS: 1.0000 | ORAL_TABLET | Freq: Every evening | ORAL | Status: DC | PRN

## 2014-01-31 MED ORDER — METRONIDAZOLE IN NACL 5-0.79 MG/ML-% IV SOLN
500.0000 mg | Freq: Three times a day (TID) | INTRAVENOUS | Status: DC
  Administered 2014-01-31 – 2014-02-01 (×4): 500 mg via INTRAVENOUS
  Filled 2014-01-31 (×6): qty 100

## 2014-01-31 MED ORDER — DIPHENHYDRAMINE HCL 50 MG/ML IJ SOLN: 10.0000 mg | Freq: Once | INTRAMUSCULAR | Status: DC

## 2014-01-31 MED ORDER — OXYCODONE-ACETAMINOPHEN 5-325 MG PO TABS
ORAL_TABLET | ORAL | Status: AC
  Administered 2014-01-31: 2 via ORAL
  Filled 2014-01-31: qty 2

## 2014-01-31 MED ORDER — ONDANSETRON HCL 4 MG/2ML IJ SOLN
INTRAMUSCULAR | Status: DC | PRN
  Administered 2014-01-31: 4 mg via INTRAVENOUS

## 2014-01-31 MED ORDER — DIPHENHYDRAMINE HCL 50 MG/ML IJ SOLN
INTRAMUSCULAR | Status: DC | PRN
  Administered 2014-01-31: 10 mg via INTRAVENOUS

## 2014-01-31 MED ORDER — HYDROMORPHONE HCL 1 MG/ML IJ SOLN
INTRAMUSCULAR | Status: AC
  Administered 2014-01-31: 0.5 mg via INTRAVENOUS
  Filled 2014-01-31: qty 1

## 2014-01-31 MED ORDER — ALUM & MAG HYDROXIDE-SIMETH 200-200-20 MG/5ML PO SUSP: 30.0000 mL | Freq: Four times a day (QID) | ORAL | Status: DC | PRN

## 2014-01-31 MED ORDER — HYDROMORPHONE HCL 1 MG/ML IJ SOLN
0.2500 mg | INTRAMUSCULAR | Status: DC | PRN
  Administered 2014-01-31 (×3): 0.5 mg via INTRAVENOUS

## 2014-01-31 MED ORDER — IPRATROPIUM-ALBUTEROL 0.5-2.5 (3) MG/3ML IN SOLN: 3.0000 mL | RESPIRATORY_TRACT | Status: DC | PRN

## 2014-01-31 MED ORDER — ARTIFICIAL TEARS OP OINT
TOPICAL_OINTMENT | OPHTHALMIC | Status: AC
  Filled 2014-01-31: qty 3.5

## 2014-01-31 MED ORDER — FENTANYL CITRATE 0.05 MG/ML IJ SOLN
INTRAMUSCULAR | Status: AC
  Filled 2014-01-31: qty 5

## 2014-01-31 MED ORDER — PREDNISONE 50 MG PO TABS
50.0000 mg | ORAL_TABLET | Freq: Every day | ORAL | Status: DC
  Administered 2014-02-01 – 2014-02-02 (×2): 50 mg via ORAL
  Filled 2014-01-31 (×4): qty 1

## 2014-01-31 MED ORDER — BISACODYL 5 MG PO TBEC
5.0000 mg | DELAYED_RELEASE_TABLET | Freq: Every day | ORAL | Status: DC | PRN
  Administered 2014-02-01: 5 mg via ORAL
  Filled 2014-01-31: qty 1

## 2014-01-31 MED ORDER — MIDAZOLAM HCL 2 MG/2ML IJ SOLN
INTRAMUSCULAR | Status: AC
  Filled 2014-01-31: qty 2

## 2014-01-31 MED ORDER — ONDANSETRON HCL 4 MG PO TABS: 4.0000 mg | ORAL_TABLET | Freq: Four times a day (QID) | ORAL | Status: DC | PRN

## 2014-01-31 MED ORDER — LIDOCAINE HCL (CARDIAC) 20 MG/ML IV SOLN
INTRAVENOUS | Status: AC
  Filled 2014-01-31: qty 5

## 2014-01-31 MED ORDER — DEXAMETHASONE SODIUM PHOSPHATE 4 MG/ML IJ SOLN
INTRAMUSCULAR | Status: DC | PRN
  Administered 2014-01-31: 8 mg via INTRAVENOUS

## 2014-01-31 MED ORDER — ALBUTEROL SULFATE (2.5 MG/3ML) 0.083% IN NEBU
2.5000 mg | INHALATION_SOLUTION | RESPIRATORY_TRACT | Status: DC | PRN
  Administered 2014-01-31 – 2014-02-02 (×4): 2.5 mg via RESPIRATORY_TRACT
  Filled 2014-01-31 (×4): qty 3

## 2014-01-31 MED ORDER — BUPIVACAINE-EPINEPHRINE 0.25% -1:200000 IJ SOLN
INTRAMUSCULAR | Status: DC | PRN
  Administered 2014-01-31: 20 mL

## 2014-01-31 MED ORDER — ALBUTEROL SULFATE HFA 108 (90 BASE) MCG/ACT IN AERS
INHALATION_SPRAY | RESPIRATORY_TRACT | Status: AC
  Filled 2014-01-31: qty 6.7

## 2014-01-31 MED ORDER — MIDAZOLAM HCL 2 MG/2ML IJ SOLN: 0.5000 mg | Freq: Once | INTRAMUSCULAR | Status: DC | PRN

## 2014-01-31 MED ORDER — PROPOFOL 10 MG/ML IV BOLUS
INTRAVENOUS | Status: AC
  Filled 2014-01-31: qty 20

## 2014-01-31 MED ORDER — ALBUTEROL SULFATE HFA 108 (90 BASE) MCG/ACT IN AERS
INHALATION_SPRAY | RESPIRATORY_TRACT | Status: DC | PRN
Start: 2014-01-31 — End: 2014-01-31
  Administered 2014-01-31: 6 via RESPIRATORY_TRACT

## 2014-01-31 MED ORDER — HYDRALAZINE HCL 10 MG PO TABS
10.0000 mg | ORAL_TABLET | Freq: Four times a day (QID) | ORAL | Status: DC | PRN
  Administered 2014-01-31: 10 mg via ORAL
  Filled 2014-01-31: qty 1

## 2014-01-31 MED ORDER — LACTATED RINGERS IV SOLN
INTRAVENOUS | Status: DC | PRN
  Administered 2014-01-31: 09:00:00 via INTRAVENOUS

## 2014-01-31 MED ORDER — MIDAZOLAM HCL 5 MG/5ML IJ SOLN
INTRAMUSCULAR | Status: DC | PRN
  Administered 2014-01-31 (×2): 0.5 mg via INTRAVENOUS

## 2014-01-31 MED ORDER — SUCCINYLCHOLINE CHLORIDE 20 MG/ML IJ SOLN
INTRAMUSCULAR | Status: AC
  Filled 2014-01-31: qty 1

## 2014-01-31 MED ORDER — LIDOCAINE HCL (CARDIAC) 20 MG/ML IV SOLN
INTRAVENOUS | Status: DC | PRN
  Administered 2014-01-31: 25 mg via INTRAVENOUS

## 2014-01-31 MED ORDER — MORPHINE SULFATE 2 MG/ML IJ SOLN
2.0000 mg | INTRAMUSCULAR | Status: DC | PRN
  Administered 2014-01-31: 2 mg via INTRAVENOUS
  Filled 2014-01-31: qty 1

## 2014-01-31 MED ORDER — ARTIFICIAL TEARS OP OINT
TOPICAL_OINTMENT | OPHTHALMIC | Status: DC | PRN
Start: 2014-01-31 — End: 2014-01-31
  Administered 2014-01-31: 1 via OPHTHALMIC

## 2014-01-31 MED ORDER — SUCCINYLCHOLINE CHLORIDE 20 MG/ML IJ SOLN
INTRAMUSCULAR | Status: DC | PRN
  Administered 2014-01-31: 110 mg via INTRAVENOUS

## 2014-01-31 MED ORDER — OXYCODONE-ACETAMINOPHEN 5-325 MG PO TABS
1.0000 | ORAL_TABLET | ORAL | Status: DC | PRN
  Administered 2014-01-31 – 2014-02-02 (×7): 2 via ORAL
  Filled 2014-01-31 (×6): qty 2

## 2014-01-31 MED ORDER — ENOXAPARIN SODIUM 40 MG/0.4ML ~~LOC~~ SOLN
40.0000 mg | Freq: Every day | SUBCUTANEOUS | Status: DC
  Administered 2014-02-01 – 2014-02-02 (×2): 40 mg via SUBCUTANEOUS
  Filled 2014-01-31 (×3): qty 0.4

## 2014-01-31 MED ORDER — AMLODIPINE BESYLATE 5 MG PO TABS
5.0000 mg | ORAL_TABLET | Freq: Every day | ORAL | Status: DC
  Administered 2014-02-01 – 2014-02-02 (×2): 5 mg via ORAL
  Filled 2014-01-31 (×2): qty 1

## 2014-01-31 MED ORDER — DIPHENHYDRAMINE HCL 50 MG/ML IJ SOLN
INTRAMUSCULAR | Status: AC
  Filled 2014-01-31: qty 1

## 2014-01-31 MED ORDER — DEXAMETHASONE SODIUM PHOSPHATE 4 MG/ML IJ SOLN
INTRAMUSCULAR | Status: AC
  Filled 2014-01-31: qty 2

## 2014-01-31 MED ORDER — MEPERIDINE HCL 25 MG/ML IJ SOLN: 6.2500 mg | INTRAMUSCULAR | Status: DC | PRN

## 2014-01-31 MED ORDER — BUPIVACAINE-EPINEPHRINE (PF) 0.25% -1:200000 IJ SOLN
INTRAMUSCULAR | Status: AC
  Filled 2014-01-31: qty 30

## 2014-01-31 MED ORDER — ACETAMINOPHEN 325 MG PO TABS
650.0000 mg | ORAL_TABLET | Freq: Four times a day (QID) | ORAL | Status: DC | PRN
  Administered 2014-01-31: 650 mg via ORAL
  Filled 2014-01-31 (×2): qty 2

## 2014-01-31 MED ORDER — PROPOFOL 10 MG/ML IV BOLUS
INTRAVENOUS | Status: DC | PRN
  Administered 2014-01-31: 200 mg via INTRAVENOUS

## 2014-01-31 MED ORDER — SODIUM CHLORIDE 0.9 % IR SOLN
Status: DC | PRN
  Administered 2014-01-31: 1

## 2014-01-31 MED ORDER — CIPROFLOXACIN IN D5W 400 MG/200ML IV SOLN
400.0000 mg | Freq: Two times a day (BID) | INTRAVENOUS | Status: DC
  Administered 2014-01-31 (×2): 400 mg via INTRAVENOUS
  Filled 2014-01-31 (×4): qty 200

## 2014-01-31 MED ORDER — ONDANSETRON HCL 4 MG/2ML IJ SOLN
INTRAMUSCULAR | Status: AC
  Filled 2014-01-31: qty 2

## 2014-01-31 MED ORDER — HYDROMORPHONE HCL 1 MG/ML IJ SOLN
1.0000 mg | INTRAMUSCULAR | Status: DC | PRN
  Administered 2014-01-31 (×3): 1 mg via INTRAVENOUS
  Filled 2014-01-31 (×3): qty 1

## 2014-01-31 MED ORDER — PROMETHAZINE HCL 25 MG/ML IJ SOLN: 6.2500 mg | INTRAMUSCULAR | Status: DC | PRN

## 2014-01-31 MED ORDER — ONDANSETRON HCL 4 MG/2ML IJ SOLN
4.0000 mg | Freq: Four times a day (QID) | INTRAMUSCULAR | Status: DC | PRN
  Administered 2014-01-31 (×2): 4 mg via INTRAVENOUS
  Filled 2014-01-31 (×2): qty 2

## 2014-01-31 MED ORDER — 0.9 % SODIUM CHLORIDE (POUR BTL) OPTIME
TOPICAL | Status: DC | PRN
  Administered 2014-01-31: 1000 mL

## 2014-01-31 MED ORDER — SODIUM CHLORIDE 0.9 % IV SOLN
INTRAVENOUS | Status: DC
  Administered 2014-01-31: 13:00:00 via INTRAVENOUS

## 2014-01-31 MED ORDER — FENTANYL CITRATE 0.05 MG/ML IJ SOLN
INTRAMUSCULAR | Status: DC | PRN
  Administered 2014-01-31 (×2): 50 ug via INTRAVENOUS
  Administered 2014-01-31 (×2): 25 ug via INTRAVENOUS
  Administered 2014-01-31: 100 ug via INTRAVENOUS

## 2014-01-31 SURGICAL SUPPLY — 39 items
APL SKNCLS STERI-STRIP NONHPOA (GAUZE/BANDAGES/DRESSINGS) ×1
APPLIER CLIP 5 13 M/L LIGAMAX5 (MISCELLANEOUS) ×2
APR CLP MED LRG 5 ANG JAW (MISCELLANEOUS) ×1
BAG SPEC RTRVL LRG 6X4 10 (ENDOMECHANICALS) ×1
BANDAGE ADH SHEER 1  50/CT (GAUZE/BANDAGES/DRESSINGS) ×8 IMPLANT
BENZOIN TINCTURE PRP APPL 2/3 (GAUZE/BANDAGES/DRESSINGS) ×2 IMPLANT
BLADE SURG CLIPPER 3M 9600 (MISCELLANEOUS) IMPLANT
CANISTER SUCTION 2500CC (MISCELLANEOUS) ×2 IMPLANT
CHLORAPREP W/TINT 26ML (MISCELLANEOUS) ×2 IMPLANT
CLIP APPLIE 5 13 M/L LIGAMAX5 (MISCELLANEOUS) ×1 IMPLANT
COVER MAYO STAND STRL (DRAPES) ×2 IMPLANT
COVER SURGICAL LIGHT HANDLE (MISCELLANEOUS) ×2 IMPLANT
DRAPE C-ARM 42X72 X-RAY (DRAPES) ×2 IMPLANT
DRAPE LAPAROSCOPIC ABDOMINAL (DRAPES) ×2 IMPLANT
ELECT REM PT RETURN 9FT ADLT (ELECTROSURGICAL) ×2
ELECTRODE REM PT RTRN 9FT ADLT (ELECTROSURGICAL) ×1 IMPLANT
GLOVE SURG SIGNA 7.5 PF LTX (GLOVE) ×2 IMPLANT
GOWN STRL REUS W/ TWL LRG LVL3 (GOWN DISPOSABLE) ×2 IMPLANT
GOWN STRL REUS W/ TWL XL LVL3 (GOWN DISPOSABLE) ×1 IMPLANT
GOWN STRL REUS W/TWL LRG LVL3 (GOWN DISPOSABLE) ×4
GOWN STRL REUS W/TWL XL LVL3 (GOWN DISPOSABLE) ×2
KIT BASIN OR (CUSTOM PROCEDURE TRAY) ×2 IMPLANT
KIT ROOM TURNOVER OR (KITS) ×2 IMPLANT
LIQUID BAND (GAUZE/BANDAGES/DRESSINGS) ×1 IMPLANT
NS IRRIG 1000ML POUR BTL (IV SOLUTION) ×2 IMPLANT
PAD ARMBOARD 7.5X6 YLW CONV (MISCELLANEOUS) ×2 IMPLANT
POUCH SPECIMEN RETRIEVAL 10MM (ENDOMECHANICALS) ×2 IMPLANT
SCISSORS LAP 5X35 DISP (ENDOMECHANICALS) ×2 IMPLANT
SET CHOLANGIOGRAPH 5 50 .035 (SET/KITS/TRAYS/PACK) ×2 IMPLANT
SET IRRIG TUBING LAPAROSCOPIC (IRRIGATION / IRRIGATOR) ×2 IMPLANT
SLEEVE ENDOPATH XCEL 5M (ENDOMECHANICALS) ×4 IMPLANT
SPECIMEN JAR SMALL (MISCELLANEOUS) ×2 IMPLANT
SUT MON AB 4-0 PC3 18 (SUTURE) ×2 IMPLANT
TOWEL OR 17X24 6PK STRL BLUE (TOWEL DISPOSABLE) ×2 IMPLANT
TOWEL OR 17X26 10 PK STRL BLUE (TOWEL DISPOSABLE) ×2 IMPLANT
TRAY LAPAROSCOPIC (CUSTOM PROCEDURE TRAY) ×2 IMPLANT
TROCAR XCEL BLUNT TIP 100MML (ENDOMECHANICALS) ×2 IMPLANT
TROCAR XCEL NON-BLD 5MMX100MML (ENDOMECHANICALS) ×2 IMPLANT
TUBING INSUFFLATION (TUBING) ×2 IMPLANT

## 2014-01-31 NOTE — Plan of Care (Signed)
Problem: Phase I Progression Outcomes Goal: Incision/dressings dry and intact Outcome: Progressing Skin glue present.

## 2014-01-31 NOTE — Transfer of Care (Signed)
Immediate Anesthesia Transfer of Care Note  Patient: Jocelyn FickConstance R Brown  Procedure(s) Performed: Procedure(s): LAPAROSCOPIC CHOLECYSTECTOMY  (N/A)  Patient Location: PACU  Anesthesia Type:General  Level of Consciousness: sedated and patient cooperative  Airway & Oxygen Therapy: Patient Spontanous Breathing and Patient connected to nasal cannula oxygen  Post-op Assessment: Report given to PACU RN, Post -op Vital signs reviewed and stable and Patient moving all extremities  Post vital signs: Reviewed and stable  Complications: No apparent anesthesia complications

## 2014-01-31 NOTE — Anesthesia Postprocedure Evaluation (Signed)
  Anesthesia Post-op Note  Patient: Jocelyn FickConstance R Grant  Procedure(s) Performed: Procedure(s): LAPAROSCOPIC CHOLECYSTECTOMY  (N/A)  Patient Location: PACU  Anesthesia Type:General  Level of Consciousness: awake, alert , oriented and patient cooperative  Airway and Oxygen Therapy: Patient Spontanous Breathing  Post-op Pain: mild  Post-op Assessment: Post-op Vital signs reviewed, Patient's Cardiovascular Status Stable, Respiratory Function Stable, Patent Airway, No signs of Nausea or vomiting and Pain level controlled  Post-op Vital Signs: Reviewed and stable  Last Vitals:  Filed Vitals:   01/31/14 1233  BP: 145/88  Pulse: 80  Temp: 37 C  Resp: 16    Complications: No apparent anesthesia complications

## 2014-01-31 NOTE — Progress Notes (Signed)
[facility] PROGRESS NOTE  Jocelyn FickConstance R Grant YNW:[id number] DOB: 06/21/1965 DOA: 01/30/2014 PCP: [name]  Assessment/Plan: 1. Acute cholecystitis -Patient presented with right upper quadrant abdominal pain with abdominal ultrasound showing evidence of acute cholecystitis. -She was started on empiric IV antimicrobial therapy with ciprofloxacin and Flagyl. -Gen. surgery was consulted, taken to the OR on 01/31/2014 to undergo laparoscopic cholecystectomy. -Continue supportive care  2.  Asthma -Patient having wheezing on exam -Was started on steroids and nebulizers  3.  Hypertension -Patient's blood pressures remain elevated, systolic blood pressures in the 140s to 160s -Will start Norvasc 5 mg by mouth daily  4.  Type 2 diabetes mellitus -Continue Accu-Cheks before every meal and daily at bedtime  Code Status: Full code Family Communication: Spoke to family who were present at bedside Disposition Plan: Patient to undergo laparoscopic cholecystectomy today   Consultants:  General surgery  Procedures:  Laparoscopic cholecystectomy performed on 01/31/2014  Antibiotics:  Ciprofloxacin (started on 01/30/2014)  Flagyl (started on 01/30/2014)  HPI/Subjective: Patient is a pleasant 48 year old with a past medical history of hypertension, asthma, type 2 diabetes who was admitted to the medicine service on 01/30/2014 presenting with complaints of right upper quadrant abdominal pain. Abdominal ultrasound showed acute cholecystitis without evidence of biliary obstruction. General surgery was consulted as she was made nothing by mouth, started on IV fluids, empiric IV antimicrobial therapy with ciprofloxacin and Flagyl admitted to 6N. patient was taken to the OR on 01/31/2014 undergoing laparoscopic cholecystectomy.  Objective: Filed Vitals:   01/31/14 1200  BP: 151/90  Pulse: 82  Temp: 98 F (36.7 C)  Resp: 15    Intake/Output Summary (Last 24 hours) at  01/31/14 1226 Last data filed at 01/31/14 1059  Gross per 24 hour  Intake    600 ml  Output      5 ml  Net    595 ml   There were no vitals filed for this visit.  Exam:   General:  Patient is sitting up at bedside, in no acute distress awake and alert  Cardiovascular: Regular rate and rhythm normal S1-S2 no murmurs rubs gallops  Respiratory: Normal respiratory effort lungs are clear  Abdomen: Patient having pain to palpation over right upper quadrant  Musculoskeletal: No edema  Data Reviewed: Basic Metabolic Panel:  Recent Labs Lab 01/30/14 1647 01/31/14 0605  NA 137 137  K 3.9 3.7  CL 106 107  CO2 25 23  GLUCOSE 117* 92  BUN 11 8  CREATININE 0.81 0.78  CALCIUM 9.4 8.7   Liver Function Tests:  Recent Labs Lab 01/30/14 1647 01/31/14 0605  AST 26 21  ALT 16 13  ALKPHOS 79 72  BILITOT 0.3 0.3  PROT 6.7 6.6  ALBUMIN 3.8 3.5    Recent Labs Lab 01/30/14 1647  LIPASE 32   No results for input(s): AMMONIA in the last 168 hours. CBC:  Recent Labs Lab 01/30/14 1647 01/31/14 0605  WBC 8.5 8.3  NEUTROABS 4.9  --   HGB 11.4* 11.3*  HCT 35.1* 34.4*  MCV 71.5* 71.5*  PLT 370 323   Cardiac Enzymes: No results for input(s): CKTOTAL, CKMB, CKMBINDEX, TROPONINI in the last 168 hours. BNP (last 3 results)  Recent Labs  10/04/13 1837  PROBNP 103.7   CBG:  Recent Labs Lab 01/31/14 0039 01/31/14 0359 01/31/14 0754 01/31/14 1111  GLUCAP 124* 111* 103* 182*    No results found for this or any previous visit (from the past 240 hour(s)).   Studies:  Dg Chest 2 View  01/30/2014   CLINICAL DATA:  Subacute onset of cough and wheezing for 3 weeks. Current history of moderate asthma. Initial encounter.  EXAM: CHEST  2 VIEW  COMPARISON:  Chest radiograph performed 10/04/2013  FINDINGS: The lungs are well-aerated. Mild peribronchial thickening is noted. There is no evidence of focal opacification, pleural effusion or pneumothorax.  The heart is borderline  normal in size; the mediastinal contour is within normal limits. No acute osseous abnormalities are seen.  IMPRESSION: Mild peribronchial thickening may reflect the patient's history of asthma. Lungs otherwise clear.   Electronically Signed   By: Roanna RaiderJeffery  Chang M.D.   On: 01/30/2014 23:58   [place] Abdomen Limited  01/30/2014   CLINICAL DATA:  48 year old female with biliary colic, cholelithiasis. Upper abdominal pain for 1 day with nausea. Initial encounter.  EXAM: US ABDOMEN LIMITED - RIGHT UPPER QUADRANT  COMPARISON:  Ultrasound 10/04/2013.  FINDINGS: Gallbladder:  Numerous small layering gallstones (image 3). Individual stones are up to 4 mm diameter. There is new gallbladder wall thickening up to 5 mm (image 12). Positive sonographic Murphy sign.  Common bile duct:  Diameter: 3 mm, normal  Liver:  Echogenicity at the upper limits of normal. Mildly heterogeneous echotexture throughout. No intrahepatic biliary ductal dilatation. No discrete liver lesion identified. Other findings: Negative visible right kidney.  IMPRESSION: 1. Chronic cholelithiasis with new abnormal gallbladder wall thickening, and sonographic Murphy sign, compatible with Acute Cholecystitis. 2. No evidence of biliary obstruction.   Electronically Signed   By: Augusto GambleLee  Hall M.D.   On: 01/30/2014 20:03    Scheduled Meds: . ciprofloxacin  400 mg Intravenous Q12H  . enoxaparin (LOVENOX) injection  40 mg Subcutaneous Daily  . insulin aspart  0-15 Units Subcutaneous 6 times per day  . metronidazole  500 mg Intravenous Q8H  . predniSONE  50 mg Oral Q breakfast   Continuous Infusions:   Principal Problem:   Cholecystitis Active Problems:   HTN (hypertension)   Diabetes mellitus   Hyperlipidemia   Asthma exacerbation, mild    Time spent: 35 min    [name]  [facility] Pager [phone]. If 7PM-7AM, please contact night-coverage at [email or web address], password [email or web address] 01/31/2014, 12:26 PM  LOS: 1 day

## 2014-01-31 NOTE — Plan of Care (Signed)
Problem: Phase I Progression Outcomes Goal: OOB as tolerated unless otherwise ordered Outcome: Progressing Up to bathroom to void

## 2014-01-31 NOTE — Anesthesia Preprocedure Evaluation (Addendum)
Anesthesia Evaluation  Patient identified by MRN, date of birth, ID band Patient awake    Reviewed: Allergy & Precautions, H&P , NPO status , Patient's Chart, lab work & pertinent test results  History of Anesthesia Complications Negative for: history of anesthetic complications  Airway Mallampati: II  TM Distance: >3 FB Neck ROM: Full    Dental  (+) Teeth Intact, Dental Advisory Given   Pulmonary asthma , former smoker (quit '14),  breath sounds clear to auscultation        Cardiovascular hypertension (does not take BP meds), Rhythm:Regular Rate:Normal     Neuro/Psych negative neurological ROS     GI/Hepatic Neg liver ROS, abd pain with acute chole   Endo/Other  diabetes (pt denies DM, glu 111)Morbid obesity  Renal/GU negative Renal ROS     Musculoskeletal   Abdominal (+) + obese,   Peds  Hematology   Anesthesia Other Findings   Reproductive/Obstetrics S/p BTL, LMP 01/23/14                           Anesthesia Physical Anesthesia Plan  ASA: II  Anesthesia Plan: General   Post-op Pain Management:    Induction: Intravenous  Airway Management Planned: Oral ETT  Additional Equipment:   Intra-op Plan:   Post-operative Plan: Extubation in OR  Informed Consent: I have reviewed the patients History and Physical, chart, labs and discussed the procedure including the risks, benefits and alternatives for the proposed anesthesia with the patient or authorized representative who has indicated his/her understanding and acceptance.   Dental advisory given  Plan Discussed with: CRNA and Surgeon  Anesthesia Plan Comments: (Plan routine monitors, GETA)        Anesthesia Quick Evaluation

## 2014-01-31 NOTE — Anesthesia Procedure Notes (Signed)
Procedure Name: Intubation Date/Time: 01/31/2014 9:43 AM Performed by: Marni GriffonJAMES, Iana Buzan B Pre-anesthesia Checklist: Patient identified, Emergency Drugs available, Suction available and Patient being monitored Patient Re-evaluated:Patient Re-evaluated prior to inductionPreoxygenation: Pre-oxygenation with 100% oxygen Intubation Type: IV induction Laryngoscope Size: Mac and 3 Grade View: Grade I Tube type: Oral Tube size: 7.5 mm Number of attempts: 1 Airway Equipment and Method: Stylet Placement Confirmation: positive ETCO2,  ETT inserted through vocal cords under direct vision and breath sounds checked- equal and bilateral ETT to lip (cm): 21 cm at teeth. Tube secured with: Tape Dental Injury: Teeth and Oropharynx as per pre-operative assessment

## 2014-01-31 NOTE — Plan of Care (Signed)
Problem: Phase I Progression Outcomes Goal: Voiding-avoid urinary catheter unless indicated Outcome: Progressing Ambulated to bathroom and voided large amount

## 2014-01-31 NOTE — Progress Notes (Signed)
Patient ID: [name], female   DOB: 05/04/1965, 48 y.o.   MRN: [id number]  Pt with symptomatic cholelithiasis Lap chole is recommended I discussed the procedure in detail.  We discussed the risks and benefits of a laparoscopic cholecystectomy and possible cholangiogram including, but not limited to bleeding, infection, injury to surrounding structures such as the intestine or liver, bile leak, retained gallstones, need to convert to an open procedure, prolonged diarrhea, blood clots such as  DVT, common bile duct injury, anesthesia risks, and possible need for additional procedures.  The likelihood of improvement in symptoms and return to the patient's normal status is good. We discussed the typical post-operative recovery course.

## 2014-01-31 NOTE — Op Note (Signed)
Laparoscopic Cholecystectomy Procedure Note  Indications: This patient presents with symptomatic gallbladder disease and will undergo laparoscopic cholecystectomy.  Pre-operative Diagnosis: Calculus of gallbladder with acute cholecystitis, without mention of obstruction  Post-operative Diagnosis: Same  Surgeon: Abigail MiyamotoBLACKMAN,Keishawna Carranza A   Assistants: 0  Anesthesia: General endotracheal anesthesia  ASA Class: 2  Procedure Details  The patient was seen again in the Holding Room. The risks, benefits, complications, treatment options, and expected outcomes were discussed with the patient. The possibilities of reaction to medication, pulmonary aspiration, perforation of viscus, bleeding, recurrent infection, finding a normal gallbladder, the need for additional procedures, failure to diagnose a condition, the possible need to convert to an open procedure, and creating a complication requiring transfusion or operation were discussed with the patient. The likelihood of improving the patient's symptoms with return to their baseline status is good.  The patient and/or family concurred with the proposed plan, giving informed consent. The site of surgery properly noted. The patient was taken to Operating Room, identified as [name] and the procedure verified as Laparoscopic Cholecystectomy with Intraoperative Cholangiogram. A Time Out was held and the above information confirmed.  Prior to the induction of general anesthesia, antibiotic prophylaxis was administered. General endotracheal anesthesia was then administered and tolerated well. After the induction, the abdomen was prepped with Chloraprep and draped in sterile fashion. The patient was positioned in the supine position.  Local anesthetic agent was injected into the skin near the umbilicus and an incision made. We dissected down to the abdominal fascia with blunt dissection.  The fascia was incised vertically and we entered the peritoneal cavity  bluntly.  A pursestring suture of 0-Vicryl was placed around the fascial opening.  The Hasson cannula was inserted and secured with the stay suture.  Pneumoperitoneum was then created with CO2 and tolerated well without any adverse changes in the patient's vital signs. A 5-mm port was placed in the subxiphoid position.  Two 5-mm ports were placed in the right upper quadrant. All skin incisions were infiltrated with a local anesthetic agent before making the incision and placing the trocars.   We positioned the patient in reverse Trendelenburg, tilted slightly to the patient's left.  The gallbladder was identified, the fundus grasped and retracted cephalad. Adhesions were lysed bluntly and with the electrocautery where indicated, taking care not to injure any adjacent organs or viscus. The infundibulum was grasped and retracted laterally, exposing the peritoneum overlying the triangle of Calot. This was then divided and exposed in a blunt fashion. The cystic duct was clearly identified and bluntly dissected circumferentially. A critical view of the cystic duct and cystic artery was obtained.  The cystic duct was then ligated with clips and divided. The cystic artery was, dissected free, ligated with clips and divided as well.   The gallbladder was dissected from the liver bed in retrograde fashion with the electrocautery. The gallbladder was removed and placed in an Endocatch sac. The liver bed was irrigated and inspected. Hemostasis was achieved with the electrocautery. Copious irrigation was utilized and was repeatedly aspirated until clear.  The gallbladder and Endocatch sac were then removed through the umbilical port site.  The pursestring suture was used to close the umbilical fascia.    We again inspected the right upper quadrant for hemostasis.  Pneumoperitoneum was released as we removed the trocars.  4-0 Monocryl was used to close the skin.   Benzoin, steri-strips, and clean dressings were applied.  The patient was then extubated and brought to the recovery  room in stable condition. Instrument, sponge, and needle counts were correct at closure and at the conclusion of the case.   Findings: Cholecystitis with Cholelithiasis  Estimated Blood Loss: Minimal         Drains: 0         Specimens: Gallbladder           Complications: None; patient tolerated the procedure well.         Disposition: PACU - hemodynamically stable.         Condition: stable

## 2014-02-01 ENCOUNTER — Encounter (HOSPITAL_COMMUNITY): Payer: Self-pay | Admitting: Surgery

## 2014-02-01 LAB — GLUCOSE, CAPILLARY
GLUCOSE-CAPILLARY: 109 mg/dL — AB (ref 70–99)
GLUCOSE-CAPILLARY: 186 mg/dL — AB (ref 70–99)
Glucose-Capillary: 113 mg/dL — ABNORMAL HIGH (ref 70–99)
Glucose-Capillary: 114 mg/dL — ABNORMAL HIGH (ref 70–99)
Glucose-Capillary: 154 mg/dL — ABNORMAL HIGH (ref 70–99)
Glucose-Capillary: 172 mg/dL — ABNORMAL HIGH (ref 70–99)
Glucose-Capillary: 182 mg/dL — ABNORMAL HIGH (ref 70–99)

## 2014-02-01 LAB — CBC
HCT: 33.4 % — ABNORMAL LOW (ref 36.0–46.0)
HEMOGLOBIN: 10.9 g/dL — AB (ref 12.0–15.0)
MCH: 23.8 pg — ABNORMAL LOW (ref 26.0–34.0)
MCHC: 32.6 g/dL (ref 30.0–36.0)
MCV: 72.9 fL — ABNORMAL LOW (ref 78.0–100.0)
Platelets: 281 10*3/uL (ref 150–400)
RBC: 4.58 MIL/uL (ref 3.87–5.11)
RDW: 16.4 % — ABNORMAL HIGH (ref 11.5–15.5)
WBC: 11.4 10*3/uL — ABNORMAL HIGH (ref 4.0–10.5)

## 2014-02-01 LAB — BASIC METABOLIC PANEL
Anion gap: 9 (ref 5–15)
BUN: 10 mg/dL (ref 6–23)
CALCIUM: 8.3 mg/dL — AB (ref 8.4–10.5)
CO2: 23 mmol/L (ref 19–32)
Chloride: 106 mEq/L (ref 96–112)
Creatinine, Ser: 0.88 mg/dL (ref 0.50–1.10)
GFR calc Af Amer: 90 mL/min — ABNORMAL LOW (ref >90)
GFR calc non Af Amer: 78 mL/min — ABNORMAL LOW (ref >90)
Glucose, Bld: 114 mg/dL — ABNORMAL HIGH (ref 70–99)
Potassium: 3.4 mmol/L — ABNORMAL LOW (ref 3.5–5.1)
SODIUM: 138 mmol/L (ref 135–145)

## 2014-02-01 MED ORDER — IBUPROFEN 600 MG PO TABS
600.0000 mg | ORAL_TABLET | Freq: Once | ORAL | Status: AC
  Administered 2014-02-01: 600 mg via ORAL
  Filled 2014-02-01: qty 1

## 2014-02-01 NOTE — Progress Notes (Signed)
Patient ID: Jocelyn FickConstance R Grant, female   DOB: 02/22/1965, [age] y.o.   MRN: [id number]   LOS: 2 days   POD#1  Subjective: Doing well. Pain controlled with oral medications and no nausea since last night. Tolerating diet.   Objective: Vital signs in last 24 hours: Temp:  [98 F (36.7 C)-98.9 F (37.2 C)] 98.7 F (37.1 C) (12/28 0651) Pulse Rate:  [73-83] 76 (12/28 0651) Resp:  [13-17] 16 (12/28 0651) BP: (127-184)/(66-99) 127/74 mmHg (12/28 0651) SpO2:  [91 %-100 %] 100 % (12/28 0844) Last BM Date: 01/30/14   Laboratory  CBC  Recent Labs  01/31/14 0605 02/01/14 0805  WBC 8.3 11.4*  HGB 11.3* 10.9*  HCT 34.4* 33.4*  PLT 323 281   BMET  Recent Labs  01/31/14 0605 02/01/14 0805  NA 137 138  K 3.7 3.4*  CL 107 106  CO2 23 23  GLUCOSE 92 114*  BUN 8 10  CREATININE 0.78 0.88  CALCIUM 8.7 8.3*    Physical Exam General appearance: alert and no distress Resp: clear to auscultation bilaterally Cardio: regular rate and rhythm GI: Soft, +BS, port sites WNL   Assessment/Plan: Acute cholecystitis s/p lap chole -- Doing well. Ok for d/c from GS standpoint. Will stop abx. Multiple medical problems -- per primary service    Freeman CaldronMichael J. Charlee Whitebread, PA-C Pager: [phone] 02/01/2014

## 2014-02-01 NOTE — Progress Notes (Signed)
Pt placed on CPAP (18.0 cm H2O) via Pt's personal nasal pillows and tubing.  CPAP unit is [facility] property.  Pt currently tolerating well.  RT to monitor as needed.

## 2014-02-01 NOTE — Progress Notes (Signed)
Pt places self on/off cpap.  Rt will continue to monitor. 

## 2014-02-01 NOTE — Progress Notes (Signed)
[facility] PROGRESS NOTE  Georgianne FickConstance R Brown ZOX:[id number] DOB: 01/12/1966 DOA: 01/30/2014 PCP: Carollee HerterLALONDE,JOHN CHARLES, MD  Assessment/Plan: 1. Acute cholecystitis -Patient presented with right upper quadrant abdominal pain with abdominal ultrasound showing evidence of acute cholecystitis -Gen. surgery was consulted, taken to the OR on 01/31/2014 to undergo laparoscopic cholecystectomy. -IV antibiotic therapy discontinued on 02/01/2014 -Patient remains stable, afebrile, tolerating by mouth intake. Anticipate discharge in the next 24 hours  2.  Asthma -She continues to have some wheezing on exam -Continue steroids, nebulizer treatments, supportive care  3.  Hypertension -Blood pressures improving  -2-D Norvasc 5 mg by mouth daily  4.  Type 2 diabetes mellitus -Continue Accu-Cheks before every meal and daily at bedtime  Code Status: Full code Family Communication: Spoke to family who were present at bedside Disposition Plan: Dysphagia discharge in the next 24 hours   Consultants:  General surgery  Procedures:  Laparoscopic cholecystectomy performed on 01/31/2014  Antibiotics:  Ciprofloxacin (started on 01/30/2014)  Flagyl (started on 01/30/2014)  HPI/Subjective: Patient is a pleasant 48 year old with a past medical history of hypertension, asthma, type 2 diabetes who was admitted to the medicine service on 01/30/2014 presenting with complaints of right upper quadrant abdominal pain. Abdominal ultrasound showed acute cholecystitis without evidence of biliary obstruction. General surgery was consulted as she was made nothing by mouth, started on IV fluids, empiric IV antimicrobial therapy with ciprofloxacin and Flagyl admitted to [facility]. patient was taken to the OR on 01/31/2014 undergoing laparoscopic cholecystectomy.  Objective: Filed Vitals:   02/01/14 1345  BP: 149/76  Pulse: 85  Temp: 98.8 F (37.1 C)  Resp: 16    Intake/Output Summary (Last 24 hours) at  02/01/14 1704 Last data filed at 02/01/14 1300  Gross per 24 hour  Intake    720 ml  Output      7 ml  Net    713 ml   There were no vitals filed for this visit.  Exam:   General:  Patient is sitting up at bedside, in no acute distress awake and alert  Cardiovascular: Regular rate and rhythm normal S1-S2 no murmurs rubs gallops  Respiratory: Patient having a few scattered expiratory wheezes on exam  Abdomen: Patient having pain to palpation over right upper quadrant  Musculoskeletal: No edema  Data Reviewed: Basic Metabolic Panel:  Recent Labs Lab 01/30/14 1647 01/31/14 0605 02/01/14 0805  NA 137 137 138  K 3.9 3.7 3.4*  CL 106 107 106  CO2 25 23 23   GLUCOSE 117* 92 114*  BUN 11 8 10   CREATININE 0.81 0.78 0.88  CALCIUM 9.4 8.7 8.3*   Liver Function Tests:  Recent Labs Lab 01/30/14 1647 01/31/14 0605  AST 26 21  ALT 16 13  ALKPHOS 79 72  BILITOT 0.3 0.3  PROT 6.7 6.6  ALBUMIN 3.8 3.5    Recent Labs Lab 01/30/14 1647  LIPASE 32   No results for input(s): AMMONIA in the last 168 hours. CBC:  Recent Labs Lab 01/30/14 1647 01/31/14 0605 02/01/14 0805  WBC 8.5 8.3 11.4*  NEUTROABS 4.9  --   --   HGB 11.4* 11.3* 10.9*  HCT 35.1* 34.4* 33.4*  MCV 71.5* 71.5* 72.9*  PLT 370 323 281   Cardiac Enzymes: No results for input(s): CKTOTAL, CKMB, CKMBINDEX, TROPONINI in the last 168 hours. BNP (last 3 results)  Recent Labs  10/04/13 1837  PROBNP 103.7   CBG:  Recent Labs Lab 02/01/14 0002 02/01/14 0344 02/01/14 0751 02/01/14 1205 02/01/14 1626  GLUCAP 172* 109* 114* 154* 186*    Recent Results (from the past 240 hour(s))  Surgical pcr screen     Status: None   Collection Time: 01/31/14  8:14 AM  Result Value Ref Range Status   MRSA, PCR NEGATIVE NEGATIVE Final   Staphylococcus aureus NEGATIVE NEGATIVE Final    Comment:        The Xpert SA Assay (FDA approved for NASAL specimens in patients over 46 years of age), is one component  of a comprehensive surveillance program.  Test performance has been validated by [facility] for patients greater than or equal to 46 year old. It is not intended to diagnose infection nor to guide or monitor treatment.      Studies: Dg Chest 2 View  01/30/2014   CLINICAL DATA:  Subacute onset of cough and wheezing for 3 weeks. Current history of moderate asthma. Initial encounter.  EXAM: CHEST  2 VIEW  COMPARISON:  Chest radiograph performed 10/04/2013  FINDINGS: The lungs are well-aerated. Mild peribronchial thickening is noted. There is no evidence of focal opacification, pleural effusion or pneumothorax.  The heart is borderline normal in size; the mediastinal contour is within normal limits. No acute osseous abnormalities are seen.  IMPRESSION: Mild peribronchial thickening may reflect the patient's history of asthma. Lungs otherwise clear.   Electronically Signed   By: Roanna RaiderJeffery  Chang M.D.   On: 01/30/2014 23:58   [place] Abdomen Limited  01/30/2014   CLINICAL DATA:  48 year old female with biliary colic, cholelithiasis. Upper abdominal pain for 1 day with nausea. Initial encounter.  EXAM: US ABDOMEN LIMITED - RIGHT UPPER QUADRANT  COMPARISON:  Ultrasound 10/04/2013.  FINDINGS: Gallbladder:  Numerous small layering gallstones (image 3). Individual stones are up to 4 mm diameter. There is new gallbladder wall thickening up to 5 mm (image 12). Positive sonographic Murphy sign.  Common bile duct:  Diameter: 3 mm, normal  Liver:  Echogenicity at the upper limits of normal. Mildly heterogeneous echotexture throughout. No intrahepatic biliary ductal dilatation. No discrete liver lesion identified. Other findings: Negative visible right kidney.  IMPRESSION: 1. Chronic cholelithiasis with new abnormal gallbladder wall thickening, and sonographic Murphy sign, compatible with Acute Cholecystitis. 2. No evidence of biliary obstruction.   Electronically Signed   By: Augusto GambleLee  Hall M.D.   On: 01/30/2014 20:03     Scheduled Meds: . amLODipine  5 mg Oral Daily  . enoxaparin (LOVENOX) injection  40 mg Subcutaneous Daily  . ibuprofen  600 mg Oral Once  . insulin aspart  0-15 Units Subcutaneous 6 times per day  . predniSONE  50 mg Oral Q breakfast   Continuous Infusions:   Principal Problem:   Cholecystitis Active Problems:   HTN (hypertension)   Diabetes mellitus   Hyperlipidemia   Asthma exacerbation, mild    Time spent: 25 min    [name], Carlyn Mullenbach  [facility] Pager [phone]. If 7PM-7AM, please contact night-coverage at [email or web address], password [email or web address] 02/01/2014, 5:04 PM  LOS: 2 days

## 2014-02-02 ENCOUNTER — Ambulatory Visit: Payer: Federal, State, Local not specified - PPO | Admitting: Podiatry

## 2014-02-02 DIAGNOSIS — E785 Hyperlipidemia, unspecified: Secondary | ICD-10-CM

## 2014-02-02 LAB — GLUCOSE, CAPILLARY
Glucose-Capillary: 79 mg/dL (ref 70–99)
Glucose-Capillary: 84 mg/dL (ref 70–99)
Glucose-Capillary: 119 mg/dL — ABNORMAL HIGH (ref 70–99)

## 2014-02-02 MED ORDER — ALBUTEROL SULFATE (2.5 MG/3ML) 0.083% IN NEBU: 2.5000 mg | INHALATION_SOLUTION | RESPIRATORY_TRACT | Status: DC | PRN

## 2014-02-02 MED ORDER — PREDNISONE (PAK) 10 MG PO TABS: ORAL_TABLET | ORAL | Status: DC

## 2014-02-02 MED ORDER — AMLODIPINE BESYLATE 5 MG PO TABS: 5.0000 mg | ORAL_TABLET | Freq: Every day | ORAL | Status: DC

## 2014-02-02 MED ORDER — OXYCODONE-ACETAMINOPHEN 5-325 MG PO TABS: 1.0000 | ORAL_TABLET | Freq: Three times a day (TID) | ORAL | Status: DC | PRN

## 2014-02-02 NOTE — Discharge Instructions (Signed)
Your appointment is at 3:45pm, please arrive at least 30 min before your appointment to complete your check in paperwork.  If you are unable to arrive 30 min prior to your appointment time we may have to cancel or reschedule you.  LAPAROSCOPIC SURGERY: POST OP INSTRUCTIONS  1. DIET: Follow a light bland diet the first 24 hours after arrival home, such as soup, liquids, crackers, etc.  Be sure to include lots of fluids daily.  Avoid fast food or heavy meals as your are more likely to get nauseated.  Eat a low fat the next few days after surgery.   2. Take your usually prescribed home medications unless otherwise directed. 3. PAIN CONTROL: a. Pain is best controlled by a usual combination of three different methods TOGETHER: i. Ice/Heat ii. Over the counter pain medication iii. Prescription pain medication b. Most patients will experience some swelling and bruising around the incisions.  Ice packs or heating pads (30-60 minutes up to 6 times a day) will help. Use ice for the first few days to help decrease swelling and bruising, then switch to heat to help relax tight/sore spots and speed recovery.  Some people prefer to use ice alone, heat alone, alternating between ice & heat.  Experiment to what works for you.  Swelling and bruising can take several weeks to resolve.   c. It is helpful to take an over-the-counter pain medication regularly for the first few weeks.  Choose one of the following that works best for you: i. Naproxen (Aleve, etc)  Two 220mg  tabs twice a day ii. Ibuprofen (Advil, etc) Three 200mg  tabs four times a day (every meal & bedtime) iii. Acetaminophen (Tylenol, etc) 500-650mg  four times a day (every meal & bedtime) d. A  prescription for pain medication (such as oxycodone, hydrocodone, etc) should be given to you upon discharge.  Take your pain medication as prescribed.  i. If you are having problems/concerns with the prescription medicine (does not control pain, nausea, vomiting,  rash, itching, etc), please call us [phone] to see if we need to switch you to a different pain medicine that will work better for you and/or control your side effect better. ii. If you need a refill on your pain medication, please contact your pharmacy.  They will contact our office to request authorization. Prescriptions will not be filled after 5 pm or on week-ends. 4. Avoid getting constipated.  Between the surgery and the pain medications, it is common to experience some constipation.  Increasing fluid intake and taking a fiber supplement (such as Metamucil, Citrucel, FiberCon, MiraLax, etc) 1-2 times a day regularly will usually help prevent this problem from occurring.  A mild laxative (prune juice, Milk of Magnesia, MiraLax, etc) should be taken according to package directions if there are no bowel movements after 48 hours.   5. Watch out for diarrhea.  If you have many loose bowel movements, simplify your diet to bland foods & liquids for a few days.  Stop any stool softeners and decrease your fiber supplement.  Switching to mild anti-diarrheal medications (Kayopectate, Pepto Bismol) can help.  If this worsens or does not improve, please call us. 6. Wash / shower every day.  You may shower over the dressings as they are waterproof.  Continue to shower over incision(s) after the dressing is off. 7. Remove your waterproof bandages 5 days after surgery.  You may leave the incision open to air.  You may replace a dressing/Band-Aid to cover the incision for  comfort if you wish.  8. ACTIVITIES as tolerated:   a. You may resume regular (light) daily activities beginning the next day--such as daily self-care, walking, climbing stairs--gradually increasing activities as tolerated.  If you can walk 30 minutes without difficulty, it is safe to try more intense activity such as jogging, treadmill, bicycling, low-impact aerobics, swimming, etc. b. Save the most intensive and strenuous activity for last  such as sit-ups, heavy lifting, contact sports, etc  Refrain from any heavy lifting or straining until you are off narcotics for pain control.   c. DO NOT PUSH THROUGH PAIN.  Let pain be your guide: If it hurts to do something, don't do it.  Pain is your body warning you to avoid that activity for another week until the pain goes down. d. You may drive when you are no longer taking prescription pain medication, you can comfortably wear a seatbelt, and you can safely maneuver your car and apply brakes. e. Bonita Quin may have sexual intercourse when it is comfortable.  9. FOLLOW UP in our office a. Please call [facility] at [phone] to set up an appointment to see your surgeon in the office for a follow-up appointment approximately 2-3 weeks after your surgery. b. Make sure that you call for this appointment the day you arrive home to insure a convenient appointment time. 10. IF YOU HAVE DISABILITY OR FAMILY LEAVE FORMS, BRING THEM TO THE OFFICE FOR PROCESSING.  DO NOT GIVE THEM TO YOUR DOCTOR.   WHEN TO CALL us [phone]: 1. Poor pain control 2. Reactions / problems with new medications (rash/itching, nausea, etc)  3. Fever over 101.5 F (38.5 C) 4. Inability to urinate 5. Nausea and/or vomiting 6. Worsening swelling or bruising 7. Continued bleeding from incision. 8. Increased pain, redness, or drainage from the incision   The clinic staff is available to answer your questions during regular business hours (8:30am-5pm).  Please dont hesitate to call and ask to speak to one of our nurses for clinical concerns.   If you have a medical emergency, go to the nearest emergency room or call 911.  A surgeon from [facility] is always on call at the [facility], [place] [address], [place], [place]  [address] ? MAIN: [phone] ? TOLL FREE: [phone] ?  FAX [phone] [email or web address]   Cholecystitis Cholecystitis is  an inflammation of your gallbladder. It is usually caused by a buildup of gallstones or sludge (cholelithiasis) in your gallbladder. The gallbladder stores a fluid that helps digest fats (bile). Cholecystitis is serious and needs treatment right away.  CAUSES   Gallstones. Gallstones can block the tube that leads to your gallbladder, causing bile to build up. As bile builds up, the gallbladder becomes inflamed.  Bile duct problems, such as blockage from scarring or kinking.  Tumors. Tumors can stop bile from leaving your gallbladder correctly, causing bile to build up. As bile builds up, the gallbladder becomes inflamed. SYMPTOMS   Nausea.  Vomiting.  Abdominal pain, especially in the upper right area of your abdomen.  Abdominal tenderness or bloating.  Sweating.  Chills.  Fever.  Yellowing of the skin and the whites of the eyes (jaundice). DIAGNOSIS  Your caregiver may order blood tests to look for infection or gallbladder problems. Your caregiver may also order imaging tests, such as an ultrasound or computed tomography (CT) scan. Further tests may include a hepatobiliary iminodiacetic acid (HIDA) scan. This scan allows your  caregiver to see your bile move from the liver to the gallbladder and to the small intestine. TREATMENT  A hospital stay is usually necessary to lessen the inflammation of your gallbladder. You may be required to not eat or drink (fast) for a certain amount of time. You may be given medicine to treat pain or an antibiotic medicine to treat an infection. Surgery may be needed to remove your gallbladder (cholecystectomy) once the inflammation has gone down. Surgery may be needed right away if you develop complications such as death of gallbladder tissue (gangrene) or a tear (perforation) of the gallbladder.  HOME CARE INSTRUCTIONS  Home care will depend on your treatment. In general:  If you were given antibiotics, take them as directed. Finish them even if you  start to feel better.  Only take over-the-counter or prescription medicines for pain, discomfort, or fever as directed by your caregiver.  Follow a low-fat diet until you see your caregiver again.  Keep all follow-up visits as directed by your caregiver. SEEK IMMEDIATE MEDICAL CARE IF:   Your pain is increasing and not controlled by medicines.  Your pain moves to another part of your abdomen or to your back.  You have a fever.  You have nausea and vomiting. MAKE SURE YOU:  Understand these instructions.  Will watch your condition.  Will get help right away if you are not doing well or get worse. Document Released: 01/22/2005 Document Revised: 04/16/2011 Document Reviewed: 12/08/2010 [facility] Patient Information 2015 [employer] [place]. This information is not intended to replace advice given to you by your health care provider. Make sure you discuss any questions you have with your health care provider.  Managing Pain  Pain after surgery or related to activity is often due to strain/injury to muscle, tendon, nerves and/or incisions.  This pain is usually short-term and will improve in a few months.   Many people find it helpful to do the following things TOGETHER to help speed the process of healing and to get back to regular activity more quickly:  1. Avoid heavy physical activity a.  no lifting greater than 20 pounds b. Do not push through the pain.  Listen to your body and avoid positions and maneuvers than reproduce the pain c. Walking is okay as tolerated, but go slowly and stop when getting sore.  d. Remember: If it hurts to do it, then dont do it! 2. Take Anti-inflammatory medication  a. Take with food/snack around the clock for 1-2 weeks i. This helps the muscle and nerve tissues become less irritable and calm down faster b. Choose ONE of the following over-the-counter medications: i. Naproxen 220mg  tabs (ex. Aleve) 1-2 pills twice a day  ii. Ibuprofen 200mg  tabs  (ex. Advil, Motrin) 3-4 pills with every meal and just before bedtime iii. Acetaminophen 500mg  tabs (Tylenol) 1-2 pills with every meal and just before bedtime 3. Use a Heating pad or Ice/Cold Pack a. 4-6 times a day b. May use warm bath/hottub  or showers 4. Try Gentle Massage and/or Stretching  a. at the area of pain many times a day b. stop if you feel pain - do not overdo it  Try these steps together to help you body heal faster and avoid making things get worse.  Doing just one of these things may not be enough.    If you are not getting better after two weeks or are noticing you are getting worse, contact our office for further advice; we may need to  re-evaluate you & see what other things we can do to help.  GETTING TO GOOD BOWEL HEALTH. Irregular bowel habits such as constipation and diarrhea can lead to many problems over time.  Having one soft bowel movement a day is the most important way to prevent further problems.  The anorectal canal is designed to handle stretching and feces to safely manage our ability to get rid of solid waste (feces, poop, stool) out of our body.  BUT, hard constipated stools can act like ripping concrete bricks and diarrhea can be a burning fire to this very sensitive area of our body, causing inflamed hemorrhoids, anal fissures, increasing risk is perirectal abscesses, abdominal pain/bloating, an making irritable bowel worse.     The goal: ONE SOFT BOWEL MOVEMENT A DAY!  To have soft, regular bowel movements:   Drink at least 8 tall glasses of water a day.    Take plenty of fiber.  Fiber is the undigested part of plant food that passes into the colon, acting s natures broom to encourage bowel motility and movement.  Fiber can absorb and hold large amounts of water. This results in a larger, bulkier stool, which is soft and easier to pass. Work gradually over several weeks up to 6 servings a day of fiber (25g a day even more if needed) in the form  of: o Vegetables -- Root (potatoes, carrots, turnips), leafy green (lettuce, salad greens, celery, spinach), or cooked high residue (cabbage, broccoli, etc) o Fruit -- Fresh (unpeeled skin & pulp), Dried (prunes, apricots, cherries, etc ),  or stewed ( applesauce)  o Whole grain breads, pasta, etc (whole wheat)  o Bran cereals   Bulking Agents -- This type of water-retaining fiber generally is easily obtained each day by one of the following:  o Psyllium bran -- The psyllium plant is remarkable because its ground seeds can retain so much water. This product is available as Metamucil, Konsyl, Effersyllium, Per Diem Fiber, or the less expensive generic preparation in drug and health food stores. Although labeled a laxative, it really is not a laxative.  o Methylcellulose -- This is another fiber derived from wood which also retains water. It is available as Citrucel. o Polyethylene Glycol - and artificial fiber commonly called Miralax or Glycolax.  It is helpful for people with gassy or bloated feelings with regular fiber o Flax Seed - a less gassy fiber than psyllium  No reading or other relaxing activity while on the toilet. If bowel movements take longer than 5 minutes, you are too constipated  AVOID CONSTIPATION.  High fiber and water intake usually takes care of this.  Sometimes a laxative is needed to stimulate more frequent bowel movements, but   Laxatives are not a good long-term solution as it can wear the colon out. o Osmotics (Milk of Magnesia, Fleets phosphosoda, Magnesium citrate, MiraLax, GoLytely) are safer than  o Stimulants (Senokot, Castor Oil, Dulcolax, Ex Lax)    o Do not take laxatives for more than 7days in a row.   IF SEVERELY CONSTIPATED, try a Bowel Retraining Program: o Do not use laxatives.  o Eat a diet high in roughage, such as bran cereals and leafy vegetables.  o Drink six (6) ounces of prune or apricot juice each morning.  o Eat two (2) large servings of  stewed fruit each day.  o Take one (1) heaping tablespoon of a psyllium-based bulking agent twice a day. Use sugar-free sweetener when possible to avoid excessive calories.  o Eat a normal breakfast.  o Set aside 15 minutes after breakfast to sit on the toilet, but do not strain to have a bowel movement.  o If you do not have a bowel movement by the third day, use an enema and repeat the above steps.   Controlling diarrhea o Switch to liquids and simpler foods for a few days to avoid stressing your intestines further. o Avoid dairy products (especially milk & ice cream) for a short time.  The intestines often can lose the ability to digest lactose when stressed. o Avoid foods that cause gassiness or bloating.  Typical foods include beans and other legumes, cabbage, broccoli, and dairy foods.  Every person has some sensitivity to other foods, so listen to our body and avoid those foods that trigger problems for you. o Adding fiber (Citrucel, Metamucil, psyllium, Miralax) gradually can help thicken stools by absorbing excess fluid and retrain the intestines to act more normally.  Slowly increase the dose over a few weeks.  Too much fiber too soon can backfire and cause cramping & bloating. o Probiotics (such as active yogurt, Align, etc) may help repopulate the intestines and colon with normal bacteria and calm down a sensitive digestive tract.  Most studies show it to be of mild help, though, and such products can be costly. o Medicines: - Bismuth subsalicylate (ex. Kayopectate, Pepto Bismol) every 30 minutes for up to 6 doses can help control diarrhea.  Avoid if pregnant. - Loperamide (Immodium) can slow down diarrhea.  Start with two tablets (4mg  total) first and then try one tablet every 6 hours.  Avoid if you are having fevers or severe pain.  If you are not better or start feeling worse, stop all medicines and call your doctor for advice o Call your doctor if you are getting worse or not better.   Sometimes further testing (cultures, endoscopy, X-ray studies, bloodwork, etc) may be needed to help diagnose and treat the cause of the diarrhea. o

## 2014-02-02 NOTE — Discharge Summary (Signed)
Physician Discharge Summary  Jocelyn FickConstance R Grant WUJ:[id number] DOB: 08/05/1965 DOA: 01/30/2014  PCP: Carollee HerterLALONDE,JOHN CHARLES, MD  Admit date: 01/30/2014 Discharge date: 02/02/2014  Time spent: 35 minutes  Recommendations for Outpatient Follow-up:  1. Please follow up on blood pressures, she was started on Norvasc during this hospitalization for HTN 2. Repeat BMP and CBC on hospital follow up  Discharge Diagnoses:  Principal Problem:   Cholecystitis Active Problems:   HTN (hypertension)   Diabetes mellitus   Hyperlipidemia   Asthma exacerbation, mild   Discharge Condition:   Diet recommendation: Heart Healthy  There were no vitals filed for this visit.  History of present illness:  Ms. Manson PasseyBrown is a 48yo AA woman with PMH of asthma, HTN (diet controlled), DM2 (diet controlled), HLD (used to take red yeast rice, but no longer), cushing syndrome (told by MD she had this a long time ago) OSA on CPAP who presents with RUQ pain that started this afternoon after eating some ice cream. The patient had a similar event in August of this year and presented to the ED. She was found at that time to have cholelithiasis without cholecystitis and was advised that she did not need surgery at that time (per the patient). She presents today with burning RUQ pain, nausea and decreased appetite. She has had no fever, chills, vomiting, weight loss, jaundice. An ultrasound showed a + Murphy sign and cholecystitis. Surgery was consulted by the ED.   She also complains of a few weeks worth of cough and wheezing. She notes a history of asthma for which she has not needed treatment for years. However, with the start of the holiday season, she was working in a new building with the post office, and has begun having a cough and wheezing. She notes tonight that her chest is tight, she has a cough and she is wheezing despite a breathing treatment. She has also had to start using her inhaler again. She denies  chest pain, swelling of her lower extremities, PND. She does have some difficulty lying flat.  Hospital Course:  Patient is a pleasant 48 year old with a past medical history of hypertension, asthma, type 2 diabetes who was admitted to the medicine service on 01/30/2014 presenting with complaints of right upper quadrant abdominal pain. Abdominal ultrasound showed acute cholecystitis without evidence of biliary obstruction. General surgery was consulted as she was made nothing by mouth, started on IV fluids, empiric IV antimicrobial therapy with ciprofloxacin and Flagyl admitted to 6N. patient was taken to the OR on 01/31/2014 undergoing laparoscopic cholecystectomy. Patient tolerated procedure well there were no complications. Antimicrobials were discontinued. During this hospitalization she was treated for mild asthmatic exacerbation started on oral steroid therapy. She was also treated for HTN and started on Norvasc. She was discharged to home in stable condition on 02/02/2014 to her home.   Procedures:  Laparoscopic cholecystectomy performed on 01/31/2014  Consultations:  General Surgery  Discharge Exam: Filed Vitals:   02/02/14 0606  BP: 154/81  Pulse: 75  Temp: 98.9 F (37.2 C)  Resp: 19     General: Patient is sitting up at bedside, in no acute distress awake and alert  Cardiovascular: Regular rate and rhythm normal S1-S2 no murmurs rubs gallops  Respiratory: Lungs clear to auscultation bilaterally  Abdomen: Patient having pain to palpation over right upper quadrant  Musculoskeletal: No edema  Discharge Instructions   Discharge Instructions    Call MD for:  difficulty breathing, headache or visual disturbances    Complete by:  As directed      Call MD for:  extreme fatigue    Complete by:  As directed      Call MD for:  hives    Complete by:  As directed      Call MD for:  persistant dizziness or light-headedness    Complete by:  As directed      Call MD for:   persistant nausea and vomiting    Complete by:  As directed      Call MD for:  redness, tenderness, or signs of infection (pain, swelling, redness, odor or green/yellow discharge around incision site)    Complete by:  As directed      Call MD for:  severe uncontrolled pain    Complete by:  As directed      Call MD for:  temperature >100.4    Complete by:  As directed      Diet - low sodium heart healthy    Complete by:  As directed      Increase activity slowly    Complete by:  As directed           Current Discharge Medication List    START taking these medications   Details  albuterol (PROVENTIL) (2.5 MG/3ML) 0.083% nebulizer solution Take 3 mLs (2.5 mg total) by nebulization every 4 (four) hours as needed for wheezing or shortness of breath. Qty: 75 mL, Refills: 12    amLODipine (NORVASC) 5 MG tablet Take 1 tablet (5 mg total) by mouth daily. Qty: 30 tablet, Refills: 1    oxyCODONE-acetaminophen (PERCOCET/ROXICET) 5-325 MG per tablet Take 1 tablet by mouth every 8 (eight) hours as needed for moderate pain. Qty: 15 tablet, Refills: 0    predniSONE (STERAPRED UNI-PAK) 10 MG tablet Take 4-3-2-1 tablets by mouth daily till gone. Qty Sufficient for taper Qty: 21 tablet, Refills: 0      CONTINUE these medications which have NOT CHANGED   Details  albuterol (PROVENTIL HFA;VENTOLIN HFA) 108 (90 BASE) MCG/ACT inhaler Inhale 2 puffs into the lungs every 4 (four) hours as needed for wheezing or shortness of breath.    ondansetron (ZOFRAN) 8 MG tablet Take 8 mg by mouth every 8 (eight) hours as needed for nausea or vomiting.      STOP taking these medications     ibuprofen (ADVIL,MOTRIN) 200 MG tablet      meloxicam (MOBIC) 15 MG tablet      methylPREDNIsolone (MEDROL DOSPACK) 4 MG tablet        Allergies  Allergen Reactions  . Bactrim [Sulfamethoxazole-Trimethoprim]     Lip swelling, mouth itching  . Doxycycline     Severe headache  . Rocephin [Ceftriaxone Sodium]  Hives, Itching and Other (See Comments)    wheezing   Follow-up Information    Follow up with [facility] [place] On 02/16/2014.   Why:  For post-operation check. Your appointment is at 3:45pm, please arrive at least 30 min before your appointment to complete your check in paperwork.  If you are unable to arrive 30 min prior to your appointment time we may have to cancel or reschedule you   Contact information:   [address]   [place] [address] [phone]       Follow up with Carollee Herter, MD In 2 weeks.   Specialty:  Family Medicine   Contact information:   [address] [place] [address] [phone]  The results of significant diagnostics from this hospitalization (including imaging, microbiology, ancillary and laboratory) are listed below for reference.    Significant Diagnostic Studies: Dg Chest 2 View  01/30/2014   CLINICAL DATA:  Subacute onset of cough and wheezing for 3 weeks. Current history of moderate asthma. Initial encounter.  EXAM: CHEST  2 VIEW  COMPARISON:  Chest radiograph performed 10/04/2013  FINDINGS: The lungs are well-aerated. Mild peribronchial thickening is noted. There is no evidence of focal opacification, pleural effusion or pneumothorax.  The heart is borderline normal in size; the mediastinal contour is within normal limits. No acute osseous abnormalities are seen.  IMPRESSION: Mild peribronchial thickening may reflect the patient's history of asthma. Lungs otherwise clear.   Electronically Signed   By: Roanna RaiderJeffery  Chang M.D.   On: 01/30/2014 23:58   [place] Abdomen Limited  01/30/2014   CLINICAL DATA:  48 year old female with biliary colic, cholelithiasis. Upper abdominal pain for 1 day with nausea. Initial encounter.  EXAM: US ABDOMEN LIMITED - RIGHT UPPER QUADRANT  COMPARISON:  Ultrasound 10/04/2013.  FINDINGS: Gallbladder:  Numerous small layering gallstones (image 3). Individual stones are up to 4 mm  diameter. There is new gallbladder wall thickening up to 5 mm (image 12). Positive sonographic Murphy sign.  Common bile duct:  Diameter: 3 mm, normal  Liver:  Echogenicity at the upper limits of normal. Mildly heterogeneous echotexture throughout. No intrahepatic biliary ductal dilatation. No discrete liver lesion identified. Other findings: Negative visible right kidney.  IMPRESSION: 1. Chronic cholelithiasis with new abnormal gallbladder wall thickening, and sonographic Murphy sign, compatible with Acute Cholecystitis. 2. No evidence of biliary obstruction.   Electronically Signed   By: Augusto GambleLee  Hall M.D.   On: 01/30/2014 20:03    Microbiology: Recent Results (from the past 240 hour(s))  Surgical pcr screen     Status: None   Collection Time: 01/31/14  8:14 AM  Result Value Ref Range Status   MRSA, PCR NEGATIVE NEGATIVE Final   Staphylococcus aureus NEGATIVE NEGATIVE Final    Comment:        The Xpert SA Assay (FDA approved for NASAL specimens in patients over 10421 years of age), is one component of a comprehensive surveillance program.  Test performance has been validated by [facility] for patients greater than or equal to 46 year old. It is not intended to diagnose infection nor to guide or monitor treatment.      Labs: Basic Metabolic Panel:  Recent Labs Lab 01/30/14 1647 01/31/14 0605 02/01/14 0805  NA 137 137 138  K 3.9 3.7 3.4*  CL 106 107 106  CO2 25 23 23   GLUCOSE 117* 92 114*  BUN 11 8 10   CREATININE 0.81 0.78 0.88  CALCIUM 9.4 8.7 8.3*   Liver Function Tests:  Recent Labs Lab 01/30/14 1647 01/31/14 0605  AST 26 21  ALT 16 13  ALKPHOS 79 72  BILITOT 0.3 0.3  PROT 6.7 6.6  ALBUMIN 3.8 3.5    Recent Labs Lab 01/30/14 1647  LIPASE 32   No results for input(s): AMMONIA in the last 168 hours. CBC:  Recent Labs Lab 01/30/14 1647 01/31/14 0605 02/01/14 0805  WBC 8.5 8.3 11.4*  NEUTROABS 4.9  --   --   HGB 11.4* 11.3* 10.9*  HCT 35.1* 34.4*  33.4*  MCV 71.5* 71.5* 72.9*  PLT 370 323 281   Cardiac Enzymes: No results for input(s): CKTOTAL, CKMB, CKMBINDEX, TROPONINI in the last 168 hours. BNP: BNP (last 3 results)  Recent Labs  10/04/13 1837  PROBNP 103.7   CBG:  Recent Labs Lab 02/01/14 1626 02/01/14 1935 02/01/14 2348 02/02/14 0342 02/02/14 0748  GLUCAP 186* 182* 113* 79 84       Signed:  Lahari Suttles  [facility] 02/02/2014, 11:43 AM

## 2014-02-02 NOTE — Progress Notes (Signed)
[facility] Progress Note  2 Days Post-Op  Subjective: Pt doing well, much less pain, breathing better.  No N/V, tolerating solid food.  Ambulating well.  +flatus, no BM yet.     Objective: Vital signs in last 24 hours: Temp:  [98.8 F (37.1 C)-99 F (37.2 C)] 98.9 F (37.2 C) (12/29 0606) Pulse Rate:  [75-100] 75 (12/29 0606) Resp:  [16-19] 19 (12/29 0606) BP: (119-154)/(62-81) 154/81 mmHg (12/29 0606) SpO2:  [95 %-100 %] 96 % (12/29 0852) Last BM Date: 01/30/14  Intake/Output from previous day: 12/28 0701 - 12/29 0700 In: 480 [P.O.:480] Out: 3 [Urine:3] Intake/Output this shift:    PE: Gen:  Alert, NAD, pleasant Abd: Soft, NT/ND, +BS, no HSM, incisions C/D/I   Lab Results:   Recent Labs  01/31/14 0605 02/01/14 0805  WBC 8.3 11.4*  HGB 11.3* 10.9*  HCT 34.4* 33.4*  PLT 323 281   BMET  Recent Labs  01/31/14 0605 02/01/14 0805  NA 137 138  K 3.7 3.4*  CL 107 106  CO2 23 23  GLUCOSE 92 114*  BUN 8 10  CREATININE 0.78 0.88  CALCIUM 8.7 8.3*   PT/INR  Recent Labs  01/31/14 0605  LABPROT 14.2  INR 1.09   CMP     Component Value Date/Time   NA 138 02/01/2014 0805   K 3.4* 02/01/2014 0805   CL 106 02/01/2014 0805   CO2 23 02/01/2014 0805   GLUCOSE 114* 02/01/2014 0805   BUN 10 02/01/2014 0805   CREATININE 0.88 02/01/2014 0805   CREATININE 0.83 06/05/2011 1523   CALCIUM 8.3* 02/01/2014 0805   PROT 6.6 01/31/2014 0605   ALBUMIN 3.5 01/31/2014 0605   AST 21 01/31/2014 0605   ALT 13 01/31/2014 0605   ALKPHOS 72 01/31/2014 0605   BILITOT 0.3 01/31/2014 0605   GFRNONAA 78* 02/01/2014 0805   GFRAA 90* 02/01/2014 0805   Lipase     Component Value Date/Time   LIPASE 32 01/30/2014 1647       Studies/Results: No results found.  Anti-infectives: Anti-infectives    Start     Dose/Rate Route Frequency Ordered Stop   01/31/14 1000  ciprofloxacin (CIPRO) IVPB 400 mg  Status:  Discontinued     400 mg200 mL/hr over 60 Minutes  Intravenous Every 12 hours 01/31/14 0040 02/01/14 1048   01/31/14 0600  metroNIDAZOLE (FLAGYL) IVPB 500 mg  Status:  Discontinued     500 mg100 mL/hr over 60 Minutes Intravenous Every 8 hours 01/31/14 0040 02/01/14 1048   01/30/14 2215  ciprofloxacin (CIPRO) IVPB 400 mg     400 mg200 mL/hr over 60 Minutes Intravenous  Once 01/30/14 2207 01/31/14 0055   01/30/14 2215  metroNIDAZOLE (FLAGYL) IVPB 500 mg     500 mg100 mL/hr over 60 Minutes Intravenous  Once 01/30/14 2207 01/31/14 0055       Assessment/Plan Acute cholecystitis POD #2 s/p lap chole -- Doing well. Ok for d/c from GS standpoint. She will be d/c today.  Discussed post-op care and restrictions.  Post-op appt in 2 weeks. Multiple medical problems -- per primary service   LOS: 3 days    Aris GeorgiaDORT, Jhanae Jaskowiak 02/02/2014, 11:43 AM Pager: [phone]

## 2014-04-20 ENCOUNTER — Ambulatory Visit (INDEPENDENT_AMBULATORY_CARE_PROVIDER_SITE_OTHER): Payer: Federal, State, Local not specified - PPO | Admitting: Physician Assistant

## 2014-04-20 VITALS — BP 150/90 | HR 90 | Temp 98.2°F | Resp 16 | Ht 64.0 in | Wt 236.0 lb

## 2014-04-20 DIAGNOSIS — J45901 Unspecified asthma with (acute) exacerbation: Secondary | ICD-10-CM

## 2014-04-20 DIAGNOSIS — J309 Allergic rhinitis, unspecified: Secondary | ICD-10-CM

## 2014-04-20 DIAGNOSIS — R062 Wheezing: Secondary | ICD-10-CM

## 2014-04-20 DIAGNOSIS — I10 Essential (primary) hypertension: Secondary | ICD-10-CM | POA: Diagnosis not present

## 2014-04-20 MED ORDER — PREDNISONE 20 MG PO TABS: 40.0000 mg | ORAL_TABLET | Freq: Every day | ORAL | Status: DC

## 2014-04-20 MED ORDER — ALBUTEROL SULFATE (2.5 MG/3ML) 0.083% IN NEBU
2.5000 mg | INHALATION_SOLUTION | Freq: Once | RESPIRATORY_TRACT | Status: AC
  Administered 2014-04-20: 2.5 mg via RESPIRATORY_TRACT

## 2014-04-20 MED ORDER — METHYLPREDNISOLONE SODIUM SUCC 125 MG IJ SOLR
125.0000 mg | Freq: Once | INTRAMUSCULAR | Status: AC
  Administered 2014-04-20: 125 mg via INTRAMUSCULAR

## 2014-04-20 MED ORDER — ALBUTEROL SULFATE HFA 108 (90 BASE) MCG/ACT IN AERS: 2.0000 | INHALATION_SPRAY | RESPIRATORY_TRACT | Status: DC | PRN

## 2014-04-20 MED ORDER — IPRATROPIUM BROMIDE 0.02 % IN SOLN
0.5000 mg | Freq: Once | RESPIRATORY_TRACT | Status: AC
  Administered 2014-04-20: 0.5 mg via RESPIRATORY_TRACT

## 2014-04-20 MED ORDER — BECLOMETHASONE DIPROPIONATE 40 MCG/ACT IN AERS: 2.0000 | INHALATION_SPRAY | Freq: Two times a day (BID) | RESPIRATORY_TRACT | Status: DC

## 2014-04-20 NOTE — Patient Instructions (Signed)
We gave you the steroid injection today.  Please start taking 40 mg prednisone daily for the next 5 days.  Please start using the qvar inhaler two puffs in the morning and 2 puffs in the evening.  For allergies, please take claritin daily and use flonase 2 puffs in each nostril once daily.  Please come back to see me in one month, please call ahead to see which days I'm working and follow up with me on a day I'm here. We will likely do a breathing test at that time. Please keep track of your blood pressure for the next month. Please bring your recordings to our next appointment so we can see if we need to increase your blood pressure medication. If you find yourself using your rescue inhaler more than two times per week over the next couple weeks, please come in to see me sooner. If your wheezing doesn't improve with your rescue inhaler, go to the ER asap.

## 2014-04-20 NOTE — Progress Notes (Signed)
Subjective:    Patient ID: [name], female    DOB: 1966/01/11, 49 y.o.   MRN: [id number]  Chief Complaint  Patient presents with  . Shortness of Breath    worse x 1 day ago   Patient Active Problem List   Diagnosis Date Noted  . Asthma exacerbation, mild 01/31/2014  . Snoring 05/18/2013  . Accelerated hypertension 05/18/2013  . Essential hypertension, benign 06/05/2011  . Diabetes mellitus without mention of complication 06/05/2011  . Asthma 07/21/2010  . Heart murmur 07/21/2010  . Cushing's syndrome 07/21/2010  . Hyperlipidemia 07/21/2010  . Rhinitis, allergic 07/21/2010   Prior to Admission medications   Medication Sig Start Date End Date Taking? Authorizing Provider  albuterol (PROVENTIL HFA;VENTOLIN HFA) 108 (90 BASE) MCG/ACT inhaler Inhale 2 puffs into the lungs every 4 (four) hours as needed for wheezing or shortness of breath.   Yes Historical Provider, MD  albuterol (PROVENTIL) (2.5 MG/3ML) 0.083% nebulizer solution Take 3 mLs (2.5 mg total) by nebulization every 4 (four) hours as needed for wheezing or shortness of breath. 02/02/14  Yes Jeralyn Bennett, MD  amLODipine (NORVASC) 5 MG tablet Take 1 tablet (5 mg total) by mouth daily. 02/02/14  Yes Jeralyn Bennett, MD  beclomethasone (QVAR) 40 MCG/ACT inhaler Inhale 2 puffs into the lungs 2 (two) times daily. 04/20/14   Donnajean Lopes, PA  predniSONE (DELTASONE) 20 MG tablet Take 2 tablets (40 mg total) by mouth daily with breakfast. 04/20/14   Donnajean Lopes, PA   Medications, allergies, past medical history, surgical history, family history, social history and problem list reviewed and updated.  HPI  [name] with pmh asthma since childhood with frequent exacerbations, pre-diabetes, htn presents with sob one week.   Sx started one wk ago. Has felt like she has been wheezing at home past week. Has used her albuterol inhaler every 4 hours past week. Has done her albuterol neb at home twice daily for past week. Both of  these uses are much more freq than normal. She used to be on maintenance steroid inhaler. Stopped this one year ago as sx were very well controled at that time.   Has hx multiple exacerbations/hospitalizations as child. Never been intubated for asthma. Has been on albuterol/steroid inhaler prn for yrs. One yr ago stopped the steroid inhaler and was only using the rescue maybe once per month for several months. Was exercising daily and doing well with breathing. Three months ago had gallbladder surgery and during recovery had exacerbation which required her to stay in hospital one day longer. Was not re-intubated. Since hospital dc had been doing well until one week ago.   Has hx seasonal allergies. Not taking anything currently.   Review of Systems Denies cp, palps, presyncope, syncope. No vision changes. No HA.     Objective:   Physical Exam  Constitutional: She is oriented to person, place, and time. She appears well-developed and well-nourished.  Non-toxic appearance. She does not have a sickly appearance. She does not appear ill. No distress.  BP 150/90 mmHg  Pulse 90  Temp(Src) 98.2 F (36.8 C) (Oral)  Resp 16  Ht  (1.626 m)  Wt 236 lb (107.049 kg)  BMI 40.49 kg/m2  SpO2 99%  PF 250 L/min  LMP 03/17/2014 (Approximate)   Cardiovascular: Normal rate, regular rhythm and normal heart sounds.  Exam reveals no gallop.   No murmur heard. Pulmonary/Chest: Effort normal. No accessory muscle usage. No tachypnea. No respiratory distress. She has  no decreased breath sounds. She has wheezes in the right upper field, the right middle field, the right lower field, the left upper field, the left middle field and the left lower field. She has no rhonchi. She has no rales.  Diffuse inspiratory and expiratory wheezing. Audible across room.   Neurological: She is alert and oriented to person, place, and time.  Psychiatric: She has a normal mood and affect. Her speech is normal and behavior is  normal.   Peak flow predicted: 430 Peak flow pre duoneb: 250  Administered two continuous duoneb treatments.   Peak flow post duoneb x2: 460 Lung sounds post duoneb x2: Wheezing present but improved. Expiratory wheezes RUL, LUL, RLL. No crackles, no rales. Moving air well.      Assessment & Plan:   [id number] Grant with pmh asthma since childhood with frequent exacerbations, pre-diabetes, htn presents with sob one week.   Asthma with acute exacerbation, unspecified asthma severity - Plan: albuterol (PROVENTIL) (2.5 MG/3ML) 0.083% nebulizer solution 2.5 mg, ipratropium (ATROVENT) nebulizer solution 0.5 mg, albuterol (PROVENTIL) (2.5 MG/3ML) 0.083% nebulizer solution 2.5 mg, ipratropium (ATROVENT) nebulizer solution 0.5 mg, predniSONE (DELTASONE) 20 MG tablet, beclomethasone (QVAR) 40 MCG/ACT inhaler --Duoneb x2 today --Solu-medrol IM today --prednisone 40 mg 5 days burst to start tomorrow --start qvar 2 puffs bid  --rtc one month for f/u, likely spirometry at that time to assess degree of asthma severity and determine appropriate step for management --rtc sooner if using rescue greater than 2x/week or with frequent nighttime awakenings --ER if no relief of wheezing with rescue inhaler  Allergic rhinitis, unspecified allergic rhinitis type --flonase, claritin  Essential hypertension --mildly elevated in clinic today, has been elevated past few years during visits --encouraged to check at home and record readings --rtc one month, bring recordings --> possible amlodipine dose increase or addition of 2nd agent --denies cp, vision changes, ha  Donnajean Lopesodd M. Baltasar Twilley, PA-C Physician Assistant-Certified [facility]  04/20/2014 7:Jocelyn PM

## 2014-07-30 ENCOUNTER — Telehealth: Payer: Self-pay | Admitting: Internal Medicine

## 2014-07-30 NOTE — Telephone Encounter (Signed)
Faxed over medical records to [facility] @ [phone]

## 2014-08-18 ENCOUNTER — Telehealth: Payer: Self-pay | Admitting: Internal Medicine

## 2014-08-18 NOTE — Telephone Encounter (Signed)
[facility] @ [phone]

## 2014-12-13 ENCOUNTER — Ambulatory Visit (INDEPENDENT_AMBULATORY_CARE_PROVIDER_SITE_OTHER): Payer: Federal, State, Local not specified - PPO | Admitting: Family Medicine

## 2014-12-13 VITALS — BP 132/86 | HR 78 | Temp 98.4°F | Resp 18 | Ht 64.0 in | Wt 234.0 lb

## 2014-12-13 DIAGNOSIS — J0100 Acute maxillary sinusitis, unspecified: Secondary | ICD-10-CM | POA: Diagnosis not present

## 2014-12-13 DIAGNOSIS — J45901 Unspecified asthma with (acute) exacerbation: Secondary | ICD-10-CM

## 2014-12-13 MED ORDER — AMOXICILLIN-POT CLAVULANATE 875-125 MG PO TABS: 1.0000 | ORAL_TABLET | Freq: Two times a day (BID) | ORAL | Status: DC

## 2014-12-13 MED ORDER — PREDNISONE 20 MG PO TABS: ORAL_TABLET | ORAL | Status: DC

## 2014-12-13 MED ORDER — PHENYLEPH-PROMETHAZINE-COD 5-6.25-10 MG/5ML PO SYRP: 5.0000 mL | ORAL_SOLUTION | ORAL | Status: DC | PRN

## 2014-12-13 MED ORDER — METHYLPREDNISOLONE SODIUM SUCC 125 MG IJ SOLR
125.0000 mg | Freq: Once | INTRAMUSCULAR | Status: AC
  Administered 2014-12-13: 125 mg via INTRAMUSCULAR

## 2014-12-13 NOTE — Patient Instructions (Signed)

## 2014-12-13 NOTE — Progress Notes (Signed)
Subjective:  This chart was scribed for Jocelyn Sorenson, MD by Andrew Au, ED Scribe. This patient was seen in room 2 and the patient's care was started at 4:30 PM.  Patient ID: [name], female    DOB: September 10, 1965, 48 y.o.   MRN: [id number]  HPI   Chief Complaint  Patient presents with  . feels congested  . Shortness of Breath    finds herself using her inhaler more  . Cough   HPI Comments: [name] is a 49 y.o. Female with hx of asthma, seasonal allergies and DM who presents to the [facility] complaining of cough and SOB that began 6 days ago. Symptoms started with sore throat and body aches that worsened throughout the week with nasal congestion, decreased appetite, mild constipation, SOB and coughing. She's had subjective fever with chills at night.  Cough became productive 2 days ago with yellow mucous in the morning but more clear during the day. She has been using dayquil and at night mucinex. Using albuterol every 2 hours, last use was 3 hours ago. Last abx use was 04/2014  Pt works for the [employer].  Past Medical History  Diagnosis Date  . Asthma   . Hypertension   . Diabetes mellitus   . Heart murmur     mild TR  . Hyperlipidemia   . Cushing syndrome (HCC)     ?-pt states she was given this diagnosis during a hospitalization.  Records reviewed, no mention of dx or treatment  . Allergy   . Sleep apnea    Allergies  Allergen Reactions  . Bactrim [Sulfamethoxazole-Trimethoprim]     Lip swelling, mouth itching  . Doxycycline     Severe headache  . Rocephin [Ceftriaxone Sodium] Hives, Itching and Other (See Comments)    wheezing   Prior to Admission medications   Medication Sig Start Date End Date Taking? Authorizing Provider  albuterol (PROVENTIL HFA;VENTOLIN HFA) 108 (90 BASE) MCG/ACT inhaler Inhale 2 puffs into the lungs every 4 (four) hours as needed for wheezing or shortness of breath. 04/20/14  Yes Todd McVeigh, PA  beclomethasone  (QVAR) 40 MCG/ACT inhaler Inhale 2 puffs into the lungs 2 (two) times daily. 04/20/14  Yes Todd McVeigh, PA  albuterol (PROVENTIL) (2.5 MG/3ML) 0.083% nebulizer solution Take 3 mLs (2.5 mg total) by nebulization every 4 (four) hours as needed for wheezing or shortness of breath. Patient not taking: Reported on 12/13/2014 02/02/14   Jeralyn Bennett, MD  amLODipine (NORVASC) 5 MG tablet Take 1 tablet (5 mg total) by mouth daily. Patient not taking: Reported on 12/13/2014 02/02/14   Jeralyn Bennett, MD  predniSONE (DELTASONE) 20 MG tablet Take 2 tablets (40 mg total) by mouth daily with breakfast. Patient not taking: Reported on 12/13/2014 04/20/14   Raelyn Ensign, PA   Review of Systems  Constitutional: Positive for fever and chills.  HENT: Positive for congestion, postnasal drip, sinus pressure and sore throat. Negative for trouble swallowing.   Respiratory: Positive for cough and shortness of breath.   Gastrointestinal: Positive for constipation. Negative for vomiting and diarrhea.  Musculoskeletal: Positive for myalgias.  Neurological: Positive for headaches.   Objective:   Physical Exam  Constitutional: She is oriented to person, place, and time. She appears well-developed and well-nourished. No distress.  HENT:  Head: Normocephalic and atraumatic.  Right Ear: Hearing, tympanic membrane, external ear and ear canal normal.  Left Ear: A middle ear effusion is present.  Nose: Mucosal edema present.  Mouth/Throat: Oropharynx is clear and moist. No oropharyngeal exudate or posterior oropharyngeal erythema.  Left TM with erythema and mid ar effusion Nares erythematous with edema, purluent discharge, left nare with friability.   2+ tonsils at base line  Eyes: Conjunctivae and EOM are normal.  Neck: Neck supple.  Cardiovascular: Normal rate, regular rhythm and normal heart sounds.  Exam reveals no gallop and no friction rub.   No murmur heard. Pulmonary/Chest: Effort normal. She has wheezes (  with forced exhalation). She has no rales.  Musculoskeletal: Normal range of motion.  Lymphadenopathy:       Head (right side): Tonsillar adenopathy present.       Head (left side): Tonsillar adenopathy present.    She has cervical adenopathy (anterior).       Right: No supraclavicular adenopathy present.       Left: No supraclavicular adenopathy present.  Neurological: She is alert and oriented to person, place, and time.  Skin: Skin is warm and dry.  Psychiatric: She has a normal mood and affect. Her behavior is normal.  Nursing note and vitals reviewed.   Filed Vitals:   12/13/14 1605  BP: 132/86  Pulse: 78  Temp: 98.4 F (36.9 C)  TempSrc: Oral  Resp: 18  Height: 5\' 4"  (1.626 m)  Weight: 234 lb (106.142 kg)  SpO2: 98%   Assessment & Plan:    1. Asthma exacerbation, mild   2. Acute maxillary sinusitis, recurrence not specified   3. Asthma with acute exacerbation, unspecified asthma severity    Advised she is contagious through respiratory droplets so home for work for several days - call for note if needed.  Pt req steroid shot tonight - feels she is on the verge of hosp if this gets any worse - fortunately, her vitals, exam, and clinical appearance are much more reassuring but will give solumedrol tonight - ok to use benadryl or cough syrup to sleep tonight, start oral pred tomorrow.  Meds ordered this encounter  Medications  . predniSONE (DELTASONE) 20 MG tablet    Sig: 3 tabs po qd x 3d, 2 tabs po qd x 3d, 1 tab po qd x 3d, 1/2 tab po qd x2d    Dispense:  19 tablet    Refill:  0  . amoxicillin-clavulanate (AUGMENTIN) 875-125 MG tablet    Sig: Take 1 tablet by mouth 2 (two) times daily.    Dispense:  20 tablet    Refill:  0  . methylPREDNISolone sodium succinate (SOLU-MEDROL) 125 mg/2 mL injection 125 mg    Sig:     By signing my name below, I, Raven Small, attest that this documentation has been prepared under the direction and in the presence of Jocelyn SorensonEva Shaw, MD.    Electronically Signed: Andrew Auaven Small, ED Scribe. 12/13/2014. 4:47 PM.  I personally performed the services described in this documentation, which was scribed in my presence. The recorded information has been reviewed and considered, and addended by me as needed.  Jocelyn SorensonEva Shaw, MD MPH

## 2015-02-05 ENCOUNTER — Ambulatory Visit (INDEPENDENT_AMBULATORY_CARE_PROVIDER_SITE_OTHER): Payer: Federal, State, Local not specified - PPO | Admitting: Family Medicine

## 2015-02-05 VITALS — BP 128/86 | HR 83 | Temp 98.1°F | Resp 20 | Ht 64.0 in | Wt 228.8 lb

## 2015-02-05 DIAGNOSIS — J4520 Mild intermittent asthma, uncomplicated: Secondary | ICD-10-CM

## 2015-02-05 DIAGNOSIS — R059 Cough, unspecified: Secondary | ICD-10-CM

## 2015-02-05 DIAGNOSIS — J029 Acute pharyngitis, unspecified: Secondary | ICD-10-CM | POA: Diagnosis not present

## 2015-02-05 DIAGNOSIS — R05 Cough: Secondary | ICD-10-CM

## 2015-02-05 DIAGNOSIS — J988 Other specified respiratory disorders: Secondary | ICD-10-CM

## 2015-02-05 DIAGNOSIS — J22 Unspecified acute lower respiratory infection: Secondary | ICD-10-CM

## 2015-02-05 MED ORDER — AZITHROMYCIN 250 MG PO TABS: ORAL_TABLET | ORAL | Status: DC

## 2015-02-05 NOTE — Patient Instructions (Signed)
Start azithromycin for possible early bronchitis or lower respiratory infection. Continue albuterol as needed for wheezing or shortness of breath, but if you continue to require this medicine frequently in the next 2-3 days, return for repeat evaluation as you may need prednisone again. Return sooner if worsening shortness of breath or wheezing.  Tylenol or Motrin as needed for sore throat, Cepacol cough drops, frequent liquids, tea with honey. See other information below.  Return to the clinic or go to the nearest emergency room if any of your symptoms worsen or new symptoms occur.  Cough, Adult Coughing is a reflex that clears your throat and your airways. Coughing helps to heal and protect your lungs. It is normal to cough occasionally, but a cough that happens with other symptoms or lasts a long time may be a sign of a condition that needs treatment. A cough may last only 2-3 weeks (acute), or it may last longer than 8 weeks (chronic). CAUSES Coughing is commonly caused by:  Breathing in substances that irritate your lungs.  A viral or bacterial respiratory infection.  Allergies.  Asthma.  Postnasal drip.  Smoking.  Acid backing up from the stomach into the esophagus (gastroesophageal reflux).  Certain medicines.  Chronic lung problems, including COPD (or rarely, lung cancer).  Other medical conditions such as heart failure. HOME CARE INSTRUCTIONS  Pay attention to any changes in your symptoms. Take these actions to help with your discomfort:  Take medicines only as told by your health care provider.  If you were prescribed an antibiotic medicine, take it as told by your health care provider. Do not stop taking the antibiotic even if you start to feel better.  Talk with your health care provider before you take a cough suppressant medicine.  Drink enough fluid to keep your urine clear or pale yellow.  If the air is dry, use a cold steam vaporizer or humidifier in your  bedroom or your home to help loosen secretions.  Avoid anything that causes you to cough at work or at home.  If your cough is worse at night, try sleeping in a semi-upright position.  Avoid cigarette smoke. If you smoke, quit smoking. If you need help quitting, ask your health care provider.  Avoid caffeine.  Avoid alcohol.  Rest as needed. SEEK MEDICAL CARE IF:   You have new symptoms.  You cough up pus.  Your cough does not get better after 2-3 weeks, or your cough gets worse.  You cannot control your cough with suppressant medicines and you are losing sleep.  You develop pain that is getting worse or pain that is not controlled with pain medicines.  You have a fever.  You have unexplained weight loss.  You have night sweats. SEEK IMMEDIATE MEDICAL CARE IF:  You cough up blood.  You have difficulty breathing.  Your heartbeat is very fast.   This information is not intended to replace advice given to you by your health care provider. Make sure you discuss any questions you have with your health care provider.   Document Released: 07/21/2010 Document Revised: 10/13/2014 Document Reviewed: 03/31/2014 Elsevier Interactive Patient Education 2016 Elsevier Inc.  Sore Throat A sore throat is pain, burning, irritation, or scratchiness of the throat. There is often pain or tenderness when swallowing or talking. A sore throat may be accompanied by other symptoms, such as coughing, sneezing, fever, and swollen neck glands. A sore throat is often the first sign of another sickness, such as a cold, flu,  strep throat, or mononucleosis (commonly known as mono). Most sore throats go away without medical treatment. CAUSES  The most common causes of a sore throat include:  A viral infection, such as a cold, flu, or mono.  A bacterial infection, such as strep throat, tonsillitis, or whooping cough.  Seasonal allergies.  Dryness in the air.  Irritants, such as smoke or  pollution.  Gastroesophageal reflux disease (GERD). HOME CARE INSTRUCTIONS   Only take over-the-counter medicines as directed by your caregiver.  Drink enough fluids to keep your urine clear or pale yellow.  Rest as needed.  Try using throat sprays, lozenges, or sucking on hard candy to ease any pain (if older than 4 years or as directed).  Sip warm liquids, such as broth, herbal tea, or warm water with honey to relieve pain temporarily. You may also eat or drink cold or frozen liquids such as frozen ice pops.  Gargle with salt water (mix 1 tsp salt with 8 oz of water).  Do not smoke and avoid secondhand smoke.  Put a cool-mist humidifier in your bedroom at night to moisten the air. You can also turn on a hot shower and sit in the bathroom with the door closed for 5-10 minutes. SEEK IMMEDIATE MEDICAL CARE IF:  You have difficulty breathing.  You are unable to swallow fluids, soft foods, or your saliva.  You have increased swelling in the throat.  Your sore throat does not get better in 7 days.  You have nausea and vomiting.  You have a fever or persistent symptoms for more than 2-3 days.  You have a fever and your symptoms suddenly get worse. MAKE SURE YOU:   Understand these instructions.  Will watch your condition.  Will get help right away if you are not doing well or get worse.   This information is not intended to replace advice given to you by your health care provider. Make sure you discuss any questions you have with your health care provider.   Document Released: 03/01/2004 Document Revised: 02/12/2014 Document Reviewed: 09/30/2011 Elsevier Interactive Patient Education [employer]

## 2015-02-05 NOTE — Progress Notes (Signed)
Subjective:  This chart was scribed for Jocelyn StaggersJeffrey Leticia Coletta, MD by [facility] HealthNadim Abu Grant, medical scribe at [facility].The patient was seen in exam room 08 and the patient's care was started at 3:31 PM.   Patient ID: [name], female    DOB: 12/12/1965, 49 y.o.   MRN: [id number] Chief Complaint  Patient presents with  . Cough  . Sore Throat  . Chills  . Headache  . Ear Pain   HPI HPI Comments: Jocelyn CluckConstance Grant is a 49 y.o. female who presents to [facility] for multiple complaints. Hx of asthma recent exacerbation on 12/13/14, treated with Prednisone, Augmentin, and Solu-Medrol. She feels she did not completely improve from this exacerbation. Her cough and congestion has been ongoing since last month. Over the past four days she has developed bilateral ear pain and a sore throat. The cough has been producing a discolored (brown) sputum over the past 4 days. Sputum is more prevalent in the morning. Appetite has been down for the past month. Using qvar and albuterol. Also taking ricola for her sore throat. Albuterol 3-4 times a day over the past 4 days. Sick contacts at home.  Patient Active Problem List   Diagnosis Date Noted  . Asthma exacerbation, mild 01/31/2014  . Snoring 05/18/2013  . Accelerated hypertension 05/18/2013  . Essential hypertension, benign 06/05/2011  . Diabetes mellitus without mention of complication 06/05/2011  . Asthma 07/21/2010  . Heart murmur 07/21/2010  . Cushing's syndrome (HCC) 07/21/2010  . Hyperlipidemia 07/21/2010  . Rhinitis, allergic 07/21/2010   Past Medical History  Diagnosis Date  . Asthma   . Hypertension   . Diabetes mellitus   . Heart murmur     mild TR  . Hyperlipidemia   . Cushing syndrome (HCC)     ?-pt states she was given this diagnosis during a hospitalization.  Records reviewed, no mention of dx or treatment  . Allergy   . Sleep apnea    Past Surgical History  Procedure Laterality Date  . Tubal  ligation  01/10/2000  . Cervical conization w/bx  1999  . Cholecystectomy N/A 01/31/2014    Procedure: LAPAROSCOPIC CHOLECYSTECTOMY ;  Surgeon: Abigail Miyamotoouglas Blackman, MD;  [facility];  Service: General;  Laterality: N/A;  . Gallbladder surgery  Jan 31, 2014   Allergies  Allergen Reactions  . Bactrim [Sulfamethoxazole-Trimethoprim]     Lip swelling, mouth itching  . Doxycycline     Severe headache  . Rocephin [Ceftriaxone Sodium] Hives, Itching and Other (See Comments)    wheezing   Prior to Admission medications   Medication Sig Start Date End Date Taking? Authorizing Provider  albuterol (PROVENTIL HFA;VENTOLIN HFA) 108 (90 BASE) MCG/ACT inhaler Inhale 2 puffs into the lungs every 4 (four) hours as needed for wheezing or shortness of breath. 04/20/14  Yes Todd McVeigh, PA  albuterol (PROVENTIL) (2.5 MG/3ML) 0.083% nebulizer solution Take 3 mLs (2.5 mg total) by nebulization every 4 (four) hours as needed for wheezing or shortness of breath. 02/02/14  Yes Jeralyn BennettEzequiel Zamora, MD  beclomethasone (QVAR) 40 MCG/ACT inhaler Inhale 2 puffs into the lungs 2 (two) times daily. 04/20/14  Yes Todd McVeigh, PA  amLODipine (NORVASC) 5 MG tablet Take 1 tablet (5 mg total) by mouth daily. Patient not taking: Reported on 12/13/2014 02/02/14   Jeralyn BennettEzequiel Zamora, MD  amoxicillin-clavulanate (AUGMENTIN) 875-125 MG tablet Take 1 tablet by mouth 2 (two) times daily. Patient not taking: Reported on 02/05/2015 12/13/14   Carley HammedEva  Romie Levee, MD  Phenyleph-Promethazine-Cod 5-6.25-10 MG/5ML SYRP Take 5-10 mLs by mouth every 4 (four) hours as needed (cough and sedation). Patient not taking: Reported on 02/05/2015 12/13/14   Sherren Mocha, MD  predniSONE (DELTASONE) 20 MG tablet 3 tabs po qd x 3d, 2 tabs po qd x 3d, 1 tab po qd x 3d, 1/2 tab po qd x2d Patient not taking: Reported on 02/05/2015 12/14/14   Sherren Mocha, MD   Social History   Social History  . Marital Status: Divorced    Spouse Name: N/A  . Number of Children: 3  .  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 Smoking status: Former Smoker -- 0.25 packs/day    Types: Cigarettes    Quit date: 02/21/2012  . Smokeless tobacco: Never Used  . Alcohol Use: No  . Drug Use: No  . Sexual Activity: Yes    Birth Control/ Protection: Surgical     Comment: BTL   Other Topics Concern  . Not on file   Social History Narrative   Patient is separated and lives at home with her children.   Patient has three children   Patient has a college education.   Patient is right-handed.   Patient drinks a 12 oz of coffee every morning.   Review of Systems  Constitutional: Positive for appetite change.  HENT: Positive for congestion, ear pain and sore throat.   Respiratory: Positive for cough.       Objective:  BP 128/86 mmHg  Pulse 83  Temp(Src) 98.1 F (36.7 C) (Oral)  Resp 20  Ht  (1.626 m)  Wt 228 lb 12.8 oz (103.783 kg)  BMI 39.25 kg/m2  SpO2 98%  LMP 01/18/2015 Physical Exam  Constitutional: She is oriented to person, place, and time. She appears well-developed and well-nourished. No distress.  HENT:  Head: Normocephalic and atraumatic.  Mouth/Throat: Mucous membranes are normal. Posterior oropharyngeal erythema present. No oropharyngeal exudate or tonsillar abscesses.  Minimal clear fluid and erythema bilateral TM's. Minimal maxillary tenderness bilaterally.  Eyes: Pupils are equal, round, and reactive to light.  Neck: Normal range of motion.  Tender along the AC nodes, slight enlargement of the right AC node.   Cardiovascular: Normal rate and regular rhythm.   Pulmonary/Chest: Effort normal. No respiratory distress.  Musculoskeletal: Normal range of motion.  Neurological: She is alert and oriented to person, place, and time.  Skin: Skin is warm and dry.  Psychiatric: She has a normal mood and affect. Her behavior is normal.  Nursing note and vitals reviewed.       Assessment & Plan:   [name] is a 48 y.o. female Cough  LRTI (lower respiratory tract infection) - Plan: azithromycin (ZITHROMAX) 250 MG tablet  Asthma, chronic, mild intermittent, uncomplicated  Sore throat   Bronchitis/LRTI with asthma flair.   - start Zpak  -continue albuterol, but RTC if persistent/frequent use.   -symptomatic care for sore throat as in AVS  -RTC precautions.   Meds ordered this encounter  Medications  . azithromycin (ZITHROMAX) 250 MG tablet    Sig: Take 2 pills by mouth on day 1, then 1 pill by mouth per day on days 2 through 5.    Dispense:  6 tablet    Refill:  0   Patient Instructions  Start azithromycin for possible early bronchitis or lower respiratory infection. Continue albuterol as needed for wheezing  or shortness of breath, but if you continue to require this medicine frequently in the next 2-3 days, return for repeat evaluation as you may need prednisone again. Return sooner if worsening shortness of breath or wheezing.  Tylenol or Motrin as needed for sore throat, Cepacol cough drops, frequent liquids, tea with honey. See other information below.  Return to the clinic or go to the nearest emergency room if any of your symptoms worsen or new symptoms occur.  Cough, Adult Coughing is a reflex that clears your throat and your airways. Coughing helps to heal and protect your lungs. It is normal to cough occasionally, but a cough that happens with other symptoms or lasts a long time may be a sign of a condition that needs treatment. A cough may last only 2-3 weeks (acute), or it may last longer than 8 weeks (chronic). CAUSES Coughing is commonly caused by:  Breathing in substances that irritate your lungs.  A viral or bacterial respiratory infection.  Allergies.  Asthma.  Postnasal drip.  Smoking.  Acid backing up from the stomach into the esophagus (gastroesophageal reflux).  Certain medicines.  Chronic lung problems,  including COPD (or rarely, lung cancer).  Other medical conditions such as heart failure. HOME CARE INSTRUCTIONS  Pay attention to any changes in your symptoms. Take these actions to help with your discomfort:  Take medicines only as told by your health care provider.  If you were prescribed an antibiotic medicine, take it as told by your health care provider. Do not stop taking the antibiotic even if you start to feel better.  Talk with your health care provider before you take a cough suppressant medicine.  Drink enough fluid to keep your urine clear or pale yellow.  If the air is dry, use a cold steam vaporizer or humidifier in your bedroom or your home to help loosen secretions.  Avoid anything that causes you to cough at work or at home.  If your cough is worse at night, try sleeping in a semi-upright position.  Avoid cigarette smoke. If you smoke, quit smoking. If you need help quitting, ask your health care provider.  Avoid caffeine.  Avoid alcohol.  Rest as needed. SEEK MEDICAL CARE IF:   You have new symptoms.  You cough up pus.  Your cough does not get better after 2-3 weeks, or your cough gets worse.  You cannot control your cough with suppressant medicines and you are losing sleep.  You develop pain that is getting worse or pain that is not controlled with pain medicines.  You have a fever.  You have unexplained weight loss.  You have night sweats. SEEK IMMEDIATE MEDICAL CARE IF:  You cough up blood.  You have difficulty breathing.  Your heartbeat is very fast.   This information is not intended to replace advice given to you by your health care provider. Make sure you discuss any questions you have with your health care provider.   Document Released: 07/21/2010 Document Revised: 10/13/2014 Document Reviewed: 03/31/2014 Elsevier Interactive Patient Education 2016 Elsevier Inc.  Sore Throat A sore throat is pain, burning, irritation, or  scratchiness of the throat. There is often pain or tenderness when swallowing or talking. A sore throat may be accompanied by other symptoms, such as coughing, sneezing, fever, and swollen neck glands. A sore throat is often the first sign of another sickness, such as a cold, flu, strep throat, or mononucleosis (commonly known as mono). Most sore throats go away without medical  treatment. CAUSES  The most common causes of a sore throat include:  A viral infection, such as a cold, flu, or mono.  A bacterial infection, such as strep throat, tonsillitis, or whooping cough.  Seasonal allergies.  Dryness in the air.  Irritants, such as smoke or pollution.  Gastroesophageal reflux disease (GERD). HOME CARE INSTRUCTIONS   Only take over-the-counter medicines as directed by your caregiver.  Drink enough fluids to keep your urine clear or pale yellow.  Rest as needed.  Try using throat sprays, lozenges, or sucking on hard candy to ease any pain (if older than 4 years or as directed).  Sip warm liquids, such as broth, herbal tea, or warm water with honey to relieve pain temporarily. You may also eat or drink cold or frozen liquids such as frozen ice pops.  Gargle with salt water (mix 1 tsp salt with 8 oz of water).  Do not smoke and avoid secondhand smoke.  Put a cool-mist humidifier in your bedroom at night to moisten the air. You can also turn on a hot shower and sit in the bathroom with the door closed for 5-10 minutes. SEEK IMMEDIATE MEDICAL CARE IF:  You have difficulty breathing.  You are unable to swallow fluids, soft foods, or your saliva.  You have increased swelling in the throat.  Your sore throat does not get better in 7 days.  You have nausea and vomiting.  You have a fever or persistent symptoms for more than 2-3 days.  You have a fever and your symptoms suddenly get worse. MAKE SURE YOU:   Understand these instructions.  Will watch your condition.  Will get  help right away if you are not doing well or get worse.   This information is not intended to replace advice given to you by your health care provider. Make sure you discuss any questions you have with your health care provider.   Document Released: 03/01/2004 Document Revised: 02/12/2014 Document Reviewed: 09/30/2011 Elsevier Interactive Patient Education [employer]      I personally performed the services described in this documentation, which was scribed in my presence. The recorded information has been reviewed and considered, and addended by me as needed.     By signing my name below, I, Jocelyn Abuhashem, attest that this documentation has been prepared under the direction and in the presence of Jocelyn Staggers, MD.  Electronically Signed: Conchita Paris, medical scribe. 02/05/2015, 3:43 PM.

## 2015-04-01 LAB — HM MAMMOGRAPHY: HM MAMMO: NORMAL

## 2015-05-15 ENCOUNTER — Other Ambulatory Visit: Payer: Self-pay | Admitting: Physician Assistant

## 2015-05-16 ENCOUNTER — Other Ambulatory Visit: Payer: Self-pay | Admitting: Physician Assistant

## 2015-05-18 ENCOUNTER — Ambulatory Visit (INDEPENDENT_AMBULATORY_CARE_PROVIDER_SITE_OTHER): Payer: Federal, State, Local not specified - PPO | Admitting: Emergency Medicine

## 2015-05-18 VITALS — BP 156/94 | HR 95 | Temp 97.9°F | Resp 18 | Ht 64.5 in | Wt 240.0 lb

## 2015-05-18 DIAGNOSIS — J45901 Unspecified asthma with (acute) exacerbation: Secondary | ICD-10-CM

## 2015-05-18 DIAGNOSIS — N951 Menopausal and female climacteric states: Secondary | ICD-10-CM

## 2015-05-18 DIAGNOSIS — J029 Acute pharyngitis, unspecified: Secondary | ICD-10-CM

## 2015-05-18 DIAGNOSIS — I10 Essential (primary) hypertension: Secondary | ICD-10-CM

## 2015-05-18 DIAGNOSIS — R059 Cough, unspecified: Secondary | ICD-10-CM

## 2015-05-18 DIAGNOSIS — R05 Cough: Secondary | ICD-10-CM | POA: Diagnosis not present

## 2015-05-18 DIAGNOSIS — J209 Acute bronchitis, unspecified: Secondary | ICD-10-CM | POA: Diagnosis not present

## 2015-05-18 LAB — POCT INFLUENZA A/B
INFLUENZA B, POC: NEGATIVE
Influenza A, POC: NEGATIVE

## 2015-05-18 LAB — POCT RAPID STREP A (OFFICE): RAPID STREP A SCREEN: NEGATIVE

## 2015-05-18 MED ORDER — BENZONATATE 100 MG PO CAPS: 100.0000 mg | ORAL_CAPSULE | Freq: Three times a day (TID) | ORAL | Status: DC | PRN

## 2015-05-18 MED ORDER — AMLODIPINE BESYLATE 5 MG PO TABS: 5.0000 mg | ORAL_TABLET | Freq: Every day | ORAL | Status: DC

## 2015-05-18 MED ORDER — ALBUTEROL SULFATE HFA 108 (90 BASE) MCG/ACT IN AERS: INHALATION_SPRAY | RESPIRATORY_TRACT | Status: DC

## 2015-05-18 MED ORDER — FIRST-DUKES MOUTHWASH MT SUSP: OROMUCOSAL | Status: DC

## 2015-05-18 MED ORDER — AZITHROMYCIN 250 MG PO TABS: ORAL_TABLET | ORAL | Status: DC

## 2015-05-18 MED ORDER — BECLOMETHASONE DIPROPIONATE 40 MCG/ACT IN AERS: 2.0000 | INHALATION_SPRAY | Freq: Two times a day (BID) | RESPIRATORY_TRACT | Status: DC

## 2015-05-18 NOTE — Patient Instructions (Addendum)
   IF you received an x-ray today, you will receive an invoice from [facility]. Please contact [facility] at [phone] with questions or concerns regarding your invoice.   IF you received labwork today, you will receive an invoice from [facility]. Please contact [facility] at [phone] with questions or concerns regarding your invoice.   Our billing staff will not be able to assist you with questions regarding bills from these companies.  You will be contacted with the lab results as soon as they are available. The fastest way to get your results is to activate your My Chart account. Instructions are located on the last page of this paperwork. If you have not heard from us regarding the results in 2 weeks, please contact this office.    Acute Bronchitis Bronchitis is inflammation of the airways that extend from the windpipe into the lungs (bronchi). The inflammation often causes mucus to develop. This leads to a cough, which is the most common symptom of bronchitis.  In acute bronchitis, the condition usually develops suddenly and goes away over time, usually in a couple weeks. Smoking, allergies, and asthma can make bronchitis worse. Repeated episodes of bronchitis may cause further lung problems.  CAUSES Acute bronchitis is most often caused by the same virus that causes a cold. The virus can spread from person to person (contagious) through coughing, sneezing, and touching contaminated objects. SIGNS AND SYMPTOMS   Cough.   Fever.   Coughing up mucus.   Body aches.   Chest congestion.   Chills.   Shortness of breath.   Sore throat.  DIAGNOSIS  Acute bronchitis is usually diagnosed through a physical exam. Your health care provider will also ask you questions about your medical history. Tests, such as chest X-rays, are sometimes done to rule out other conditions.  TREATMENT  Acute bronchitis usually goes away in a  couple weeks. Oftentimes, no medical treatment is necessary. Medicines are sometimes given for relief of fever or cough. Antibiotic medicines are usually not needed but may be prescribed in certain situations. In some cases, an inhaler may be recommended to help reduce shortness of breath and control the cough. A cool mist vaporizer may also be used to help thin bronchial secretions and make it easier to clear the chest.  HOME CARE INSTRUCTIONS  Get plenty of rest.   Drink enough fluids to keep your urine clear or pale yellow (unless you have a medical condition that requires fluid restriction). Increasing fluids may help thin your respiratory secretions (sputum) and reduce chest congestion, and it will prevent dehydration.   Take medicines only as directed by your health care provider.  If you were prescribed an antibiotic medicine, finish it all even if you start to feel better.  Avoid smoking and secondhand smoke. Exposure to cigarette smoke or irritating chemicals will make bronchitis worse. If you are a smoker, consider using nicotine gum or skin patches to help control withdrawal symptoms. Quitting smoking will help your lungs heal faster.   Reduce the chances of another bout of acute bronchitis by washing your hands frequently, avoiding people with cold symptoms, and trying not to touch your hands to your mouth, nose, or eyes.   Keep all follow-up visits as directed by your health care provider.  SEEK MEDICAL CARE IF: Your symptoms do not improve after 1 week of treatment.  SEEK IMMEDIATE MEDICAL CARE IF:  You develop an increased fever or chills.   You have chest pain.     You have severe shortness of breath.  You have bloody sputum.   You develop dehydration.  You faint or repeatedly feel like you are going to pass out.  You develop repeated vomiting.  You develop a severe headache. MAKE SURE YOU:   Understand these instructions.  Will watch your  condition.  Will get help right away if you are not doing well or get worse.   This information is not intended to replace advice given to you by your health care provider. Make sure you discuss any questions you have with your health care provider.   Document Released: 03/01/2004 Document Revised: 02/12/2014 Document Reviewed: 07/15/2012 Elsevier Interactive Patient Education 2016 Elsevier Inc.  

## 2015-05-18 NOTE — Addendum Note (Signed)
Addended by: Lesle ChrisAUB, Nahia Nissan A on: 05/18/2015 06:37 PM   Modules accepted: Orders

## 2015-05-18 NOTE — Progress Notes (Addendum)
Patient ID: [name], female   DOB: 12-26-1965, 50 y.o.   MRN: [id number]    By signing my name below, I, Essence Howell, attest that this documentation has been prepared under the direction and in the presence of Collene Gobble, MD Electronically Signed: Charline Bills, ED Scribe 05/18/2015 at 5:48 PM.  Chief Complaint:  Chief Complaint  Patient presents with  . Headache    x 4 days  . Sore Throat  . Hypertension  . Cough    Productive  . Nasal Congestion  . Hot Flashes   HPI: [name] is a 50 y.o. female, with a /o asthma, DM and HTN, who reports to [facility] today complaining of intermittent HA onset 4 days. Pt reports associated sore throat, subjective fever, generalized body aches, minimally productive cough, nasal congestion. Triage temperature: 97.9 F. She has tried Claritin and using her proair inhaler more often x3-4 daily without significant relief. Pt is a nonsmoker.   Post-menopausal  Pt suspects that she is going through postmenopause. She states that her menstrual cycles have completely stopped and hot flashes have worsened over the past few weeks.   Past Medical History  Diagnosis Date  . Asthma   . Hypertension   . Diabetes mellitus   . Heart murmur     mild TR  . Hyperlipidemia   . Cushing syndrome (HCC)     ?-pt states she was given this diagnosis during a hospitalization.  Records reviewed, no mention of dx or treatment  . Allergy   . Sleep apnea    Past Surgical History  Procedure Laterality Date  . Tubal ligation  01/10/2000  . Cervical conization w/bx  1999  . Cholecystectomy N/A 01/31/2014    Procedure: LAPAROSCOPIC CHOLECYSTECTOMY ;  Surgeon: Abigail Miyamoto, MD;  Location: [facility] OR;  Service: General;  Laterality: N/A;  . Gallbladder surgery  Jan 31, 2014   Social History   Social History  . Marital Status: Divorced    Spouse Name: N/A  . Number of Children: 3  . Years of Education: College   Occupational History  . 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 Smoking status: Former Smoker -- 0.25 packs/day    Types: Cigarettes    Quit date: 02/21/2012  . Smokeless tobacco: Never Used  . Alcohol Use: No  . Drug Use: No  . Sexual Activity: Yes    Birth Control/ Protection: Surgical     Comment: BTL   Other Topics Concern  . None   Social History Narrative   Patient is separated and lives at home with her children.   Patient has three children   Patient has a college education.   Patient is right-handed.   Patient drinks a 12 oz of coffee every morning.   Family History  Problem Relation Age of Onset  . Thyroid disease Sister   . Hyperlipidemia Mother   . Heart murmur Daughter   . Heart murmur Son   . Diabetes Neg Hx   . Heart murmur Daughter   . Asthma Daughter   . Cancer Cousin 21    female cousin--unknown primary, metastatic  . Pulmonary embolism Daughter 64  . Hypertension Father    Allergies  Allergen Reactions  . Bactrim [Sulfamethoxazole-Trimethoprim]     Lip swelling, mouth itching  . Doxycycline     Severe headache  . Rocephin [Ceftriaxone Sodium] Hives, Itching and Other (See Comments)    wheezing   Prior to  Admission medications   Medication Sig Start Date End Date Taking? Authorizing Provider  beclomethasone (QVAR) 40 MCG/ACT inhaler Inhale 2 puffs into the lungs 2 (two) times daily. 04/20/14  Yes Todd McVeigh, PA  PROAIR HFA 108 (90 Base) MCG/ACT inhaler INHALE 2 PUFFS INTO THE LUNGS EVERY 4 (FOUR) HOURS AS NEEDED FOR WHEEZING OR SHORTNESS OF BREATH. 05/16/15  Yes Chelle Jeffery, PA-C   ROS: The patient denies chills, night sweats, unintentional weight loss, chest pain, palpitations, wheezing, dyspnea on exertion, nausea, vomiting, abdominal pain, dysuria, hematuria, melena, numbness, weakness, or tingling. +fever (subjectrive), +hot flashes, HA, +sore throat, +cough, +congestion, +myalgias   All other systems have been reviewed and were otherwise negative with the  exception of those mentioned in the HPI and as above.    PHYSICAL EXAM: Filed Vitals:   05/18/15 1717  BP: 165/100  Pulse: 95  Temp: 97.9 F (36.6 C)  Resp: 18   Body mass index is 40.57 kg/(m^2).  General: Alert, no acute distress HEENT:  Normocephalic, atraumatic, oropharynx patent. Significant nasal congestion TMs normal. Throat is red.  Eye: Nonie HoyerOMI, [facility] Cardiovascular: Regular rate and rhythm, no rubs murmurs or gallops. No Carotid bruits, radial pulse intact. No pedal edema.  Respiratory: Clear to auscultation bilaterally. No wheezes, rales, or rhonchi. No cyanosis, no use of accessory musculature Abdominal: No organomegaly, abdomen is soft and non-tender, positive bowel sounds. No masses. Musculoskeletal: Gait intact. No edema, tenderness Skin: No rashes. Neurologic: Facial musculature symmetric. Psychiatric: Patient acts appropriately throughout our interaction. Lymphatic: Bilateral cervical nodes. No submandibular lymphadenopathy.  LABS:   Results for orders placed or performed in visit on 05/18/15  POCT Influenza A/B  Result Value Ref Range   Influenza A, POC Negative Negative   Influenza B, POC Negative Negative  POCT rapid strep A  Result Value Ref Range   Rapid Strep A Screen Negative Negative       EKG/XRAY:   Primary read [facility].  ASSESSMENT/PLAN: Treat with Z-Pak and Tessalon Perles as well as dux mouthwash as a gargle.I personally performed the services described in this documentation, which was scribed in my presence. The recorded information has been reviewed and is accurate. Patient also started on Norvasc 5 mg 1 a day for blood pressure.I personally performed the services described in this documentation, which was scribed in my presence. The recorded information has been reviewed and is accurate.   Gross sideeffects, risk and benefits, and alternatives of medications d/w patient. Patient is aware that all medications have  potential sideeffects and we are unable to predict every sideeffect or drug-drug interaction that may occur.  Lesle ChrisSteven Lamine Laton MD 05/18/2015 5:21 PM

## 2015-05-23 ENCOUNTER — Telehealth: Payer: Self-pay | Admitting: Obstetrics and Gynecology

## 2015-05-23 NOTE — Telephone Encounter (Signed)
Called and left a message for patient to call back to schedule a new patient doctor referral. °

## 2015-05-24 ENCOUNTER — Ambulatory Visit (INDEPENDENT_AMBULATORY_CARE_PROVIDER_SITE_OTHER): Payer: Federal, State, Local not specified - PPO | Admitting: Obstetrics and Gynecology

## 2015-05-24 ENCOUNTER — Encounter: Payer: Self-pay | Admitting: Obstetrics and Gynecology

## 2015-05-24 VITALS — BP 138/88 | HR 64 | Resp 14 | Wt 237.0 lb

## 2015-05-24 DIAGNOSIS — G479 Sleep disorder, unspecified: Secondary | ICD-10-CM

## 2015-05-24 DIAGNOSIS — N915 Oligomenorrhea, unspecified: Secondary | ICD-10-CM

## 2015-05-24 DIAGNOSIS — N951 Menopausal and female climacteric states: Secondary | ICD-10-CM | POA: Diagnosis not present

## 2015-05-24 DIAGNOSIS — F411 Generalized anxiety disorder: Secondary | ICD-10-CM | POA: Diagnosis not present

## 2015-05-24 DIAGNOSIS — R61 Generalized hyperhidrosis: Secondary | ICD-10-CM | POA: Diagnosis not present

## 2015-05-24 DIAGNOSIS — R232 Flushing: Secondary | ICD-10-CM

## 2015-05-24 LAB — CBC WITH DIFFERENTIAL/PLATELET
BASOS ABS: 89 {cells}/uL (ref 0–200)
Basophils Relative: 1 %
Eosinophils Absolute: 267 cells/uL (ref 15–500)
Eosinophils Relative: 3 %
HCT: 34.7 % — ABNORMAL LOW (ref 35.0–45.0)
Hemoglobin: 11.3 g/dL — ABNORMAL LOW (ref 11.7–15.5)
Lymphocytes Relative: 39 %
Lymphs Abs: 3471 cells/uL (ref 850–3900)
MCH: 23.2 pg — AB (ref 27.0–33.0)
MCHC: 32.6 g/dL (ref 32.0–36.0)
MCV: 71.1 fL — ABNORMAL LOW (ref 80.0–100.0)
MONOS PCT: 6 %
MPV: 9.4 fL (ref 7.5–12.5)
Monocytes Absolute: 534 cells/uL (ref 200–950)
NEUTROS ABS: 4539 {cells}/uL (ref 1500–7800)
NEUTROS PCT: 51 %
PLATELETS: 371 10*3/uL (ref 140–400)
RBC: 4.88 MIL/uL (ref 3.80–5.10)
RDW: 16 % — ABNORMAL HIGH (ref 11.0–15.0)
WBC: 8.9 10*3/uL (ref 3.8–10.8)

## 2015-05-24 LAB — TSH: TSH: 1.65 m[IU]/L

## 2015-05-24 MED ORDER — MEDROXYPROGESTERONE ACETATE 5 MG PO TABS: ORAL_TABLET | ORAL | Status: DC

## 2015-05-24 MED ORDER — CITALOPRAM HYDROBROMIDE 20 MG PO TABS: ORAL_TABLET | ORAL | Status: DC

## 2015-05-24 NOTE — Patient Instructions (Signed)
Try [name], can try Estroven pm for sleep  Find out about family history of clotting

## 2015-05-24 NOTE — Progress Notes (Signed)
Patient ID: [name], female   DOB: 01/22/1966, 50 y.o.   MRN: [id number] GYNECOLOGY  VISIT   HPI: 50 y.o.   Divorced  [place]   [phone] with Patient's last menstrual period was 02/14/2015.   here for  C/o irregular menstrual cycles and hot flashes. She is also having trouble sleeping. Her cycles have been irregular for the last year. Occuring q 70-80 days x 5 days, very heavy. She is changing her pad up to every 1.5 hours. Cramps are bad. No bleeding in between. She is having hot flashes all day and night, varies day to day, can be bad. She is waking up 2-3 x a night (most nights) with sweats. She isn't sleeping well, she is also feeling more anxiety.  She is divorced, has a new partner in the last 8 months. Just became sexually active. No pain. Her daughter has a h/o bilateral PE's on OCP's at 4525, never tested for a clotting disorder. Her Father and his siblings have had issues with clotting. She will find out if anyone was diagnosed with a thrombophilia.   GYNECOLOGIC HISTORY: Patient's last menstrual period was 02/14/2015. Contraception: Tubal Ligation  Menopausal hormone therapy: none        OB History    Gravida Para Term Preterm AB TAB SAB Ectopic Multiple Living   5 3   2 1 1   3          Patient Active Problem List   Diagnosis Date Noted  . Asthma exacerbation, mild 01/31/2014  . Snoring 05/18/2013  . Accelerated hypertension 05/18/2013  . Essential hypertension, benign 06/05/2011  . Diabetes mellitus without mention of complication 06/05/2011  . Asthma 07/21/2010  . Heart murmur 07/21/2010  . Cushing's syndrome (HCC) 07/21/2010  . Hyperlipidemia 07/21/2010  . Rhinitis, allergic 07/21/2010    Past Medical History  Diagnosis Date  . Asthma   . Hypertension   . Diabetes mellitus   . Heart murmur     mild TR  . Hyperlipidemia   . Cushing syndrome (HCC)     ?-pt states she was given this diagnosis during a hospitalization.  Records reviewed, no  mention of dx or treatment  . Allergy   . Sleep apnea     Past Surgical History  Procedure Laterality Date  . Tubal ligation  01/10/2000  . Cervical conization w/bx  1999  . Cholecystectomy N/A 01/31/2014    Procedure: LAPAROSCOPIC CHOLECYSTECTOMY ;  Surgeon: Abigail Miyamotoouglas Blackman, MD;  Location: [facility] OR;  Service: General;  Laterality: N/A;  . Gallbladder surgery  Jan 31, 2014    Current Outpatient Prescriptions  Medication Sig Dispense Refill  . albuterol (PROAIR HFA) 108 (90 Base) MCG/ACT inhaler INHALE 2 PUFFS INTO THE LUNGS EVERY 4 (FOUR) HOURS AS NEEDED FOR WHEEZING OR SHORTNESS OF BREATH. 8.5 Inhaler 5  . amLODipine (NORVASC) 5 MG tablet Take 1 tablet (5 mg total) by mouth daily. 90 tablet 3  . beclomethasone (QVAR) 40 MCG/ACT inhaler Inhale 2 puffs into the lungs 2 (two) times daily. 1 Inhaler 11  . citalopram (CELEXA) 20 MG tablet 1/2 tablet a day qd x 7 days, then go up to 1 tab po qd 30 tablet 1  . medroxyPROGESTERone (PROVERA) 5 MG tablet Take 1 tab a day x 5 days every other month if no spontaneous menses 15 tablet 1   No current facility-administered medications for this visit.     ALLERGIES: Bactrim; Doxycycline; and Rocephin  Family History  Problem Relation  Age of Onset  . Thyroid disease Sister   . Hyperlipidemia Mother   . Heart murmur Daughter   . Heart murmur Son   . Diabetes Neg Hx   . Heart murmur Daughter   . Asthma Daughter   . Cancer Cousin 59    female cousin--unknown primary, metastatic  . Pulmonary embolism Daughter 42  . Hypertension Father   Father with h/o blood clots, some of his sibling with h/o clots.   Social History   Social History  . Marital Status: Divorced    Spouse Name: N/A  . Number of Children: 3  . 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 Smoking status: Former Smoker -- 0.25 packs/day    Types: Cigarettes    Quit date: 02/21/2012  . Smokeless  tobacco: Never Used  . Alcohol Use: No  . Drug Use: No  . Sexual Activity:    Partners: Male    Birth Control/ Protection: Surgical     Comment: BTL   Other Topics Concern  . Not on file   Social History Narrative   Patient is separated and lives at home with her children.   Patient has three children   Patient has a college education.   Patient is right-handed.   Patient drinks a 12 oz of coffee every morning.    Review of Systems  Constitutional: Negative.   HENT: Negative.   Eyes: Negative.   Respiratory: Negative.   Cardiovascular: Negative.   Gastrointestinal: Negative.   Genitourinary:       Irregular menstrual cycles Hot flashes   Musculoskeletal: Negative.   Skin: Negative.   Neurological: Negative.   Endo/Heme/Allergies: Negative.   Psychiatric/Behavioral: The patient has insomnia.     PHYSICAL EXAMINATION:    BP 138/88 mmHg  Pulse 64  Resp 14  Wt 237 lb (107.502 kg)  LMP 02/14/2015    General appearance: alert, cooperative and appears stated age  ASSESSMENT Hot flashes, hot flashes, sleep issues Anxiety Oligomenorrhea Perimenopausal    PLAN CBC, TSH, FSH Try estroven and estroven pm Discussed behavioral changes to help control vasomotor symptoms Trial of Celexa She will find out about her family history of blood clots, unsure if her Dad or his siblings have been evaluated for a thrombophilia. Given that her Dad and daughter have had clotting issues, it increases the likelihood that she could have a clotting disorder. Would hold on HRT for now We also discussed the option of gabapentin F/U next month for an annual exam and f/u on medications and symptoms   An After Visit Summary was printed and given to the patient.  25 minutes face to face time of which over 50% was spent in counseling.

## 2015-05-25 LAB — FOLLICLE STIMULATING HORMONE: FSH: 45.5 m[IU]/mL

## 2015-05-25 LAB — PROLACTIN: Prolactin: 7.4 ng/mL

## 2015-06-20 ENCOUNTER — Ambulatory Visit (INDEPENDENT_AMBULATORY_CARE_PROVIDER_SITE_OTHER): Payer: Federal, State, Local not specified - PPO | Admitting: Obstetrics and Gynecology

## 2015-06-20 ENCOUNTER — Encounter: Payer: Self-pay | Admitting: Obstetrics and Gynecology

## 2015-06-20 VITALS — BP 138/82 | HR 80 | Resp 16 | Ht 64.0 in | Wt 237.0 lb

## 2015-06-20 DIAGNOSIS — F419 Anxiety disorder, unspecified: Secondary | ICD-10-CM

## 2015-06-20 DIAGNOSIS — Z124 Encounter for screening for malignant neoplasm of cervix: Secondary | ICD-10-CM | POA: Diagnosis not present

## 2015-06-20 DIAGNOSIS — R87612 Low grade squamous intraepithelial lesion on cytologic smear of cervix (LGSIL): Secondary | ICD-10-CM | POA: Diagnosis not present

## 2015-06-20 DIAGNOSIS — Z01419 Encounter for gynecological examination (general) (routine) without abnormal findings: Secondary | ICD-10-CM

## 2015-06-20 DIAGNOSIS — Z862 Personal history of diseases of the blood and blood-forming organs and certain disorders involving the immune mechanism: Secondary | ICD-10-CM | POA: Diagnosis not present

## 2015-06-20 DIAGNOSIS — N951 Menopausal and female climacteric states: Secondary | ICD-10-CM

## 2015-06-20 DIAGNOSIS — Z0000 Encounter for general adult medical examination without abnormal findings: Secondary | ICD-10-CM

## 2015-06-20 MED ORDER — CITALOPRAM HYDROBROMIDE 20 MG PO TABS: ORAL_TABLET | ORAL | Status: DC

## 2015-06-20 NOTE — Progress Notes (Signed)
Patient ID: [name], female   DOB: November 19, 1965, 50 y.o.   MRN: [id number] 50 y.o. [id number] DivorcedAfrican AmericanF here for annual exam.  Last month she was seen with irregular cycles and vasomotor symptoms. She had a PMP FSH, mild anemia. She was started on Celexa for anxiety and vasomotor symptoms and was given cyclic provera to take as needed. Her hot flashes are so much better, no real night sweats. Tolerable. Anxiety is better. She didn't remember to take iron. She has been meaning to take the provera, will start it now.  Sexually active, no pain. Slight vaginal dryness, just started.  Her daughter had Bilateral PE's at 40 on OCP's. Her Paunts and uncles are all on blood thinners.     Patient's last menstrual period was 02/14/2015.          Sexually active: Yes.    The current method of family planning is tubal ligation.    Exercising: No.  The patient does not participate in regular exercise at present. Smoker:  Former smoker   Health Maintenance: Pap:  09-22-12 WNL  History of abnormal Pap:  Yes years ago MMG:  04-01-15 WNL  Colonoscopy:  Never BMD:   Never TDaP:  09-21-10 Gardasil: N/A   reports that she quit smoking about 3 years ago. Her smoking use included Cigarettes. She smoked 0.25 packs per day. She has never used smokeless tobacco. She reports that she does not drink alcohol or use illicit drugs.  Past Medical History  Diagnosis Date  . Asthma   . Hypertension   . Diabetes mellitus   . Heart murmur     mild TR  . Hyperlipidemia   . Cushing syndrome (HCC)     ?-pt states she was given this diagnosis during a hospitalization.  Records reviewed, no mention of dx or treatment  . Allergy   . Sleep apnea     Past Surgical History  Procedure Laterality Date  . Tubal ligation  01/10/2000  . Cervical conization w/bx  1999  . Cholecystectomy N/A 01/31/2014    Procedure: LAPAROSCOPIC CHOLECYSTECTOMY ;  Surgeon: Abigail Miyamoto, MD;  Location[facility] OR;  Service: General;   Laterality: N/A;  . Gallbladder surgery  Jan 31, 2014    Current Outpatient Prescriptions  Medication Sig Dispense Refill  . albuterol (PROAIR HFA) 108 (90 Base) MCG/ACT inhaler INHALE 2 PUFFS INTO THE LUNGS EVERY 4 (FOUR) HOURS AS NEEDED FOR WHEEZING OR SHORTNESS OF BREATH. 8.5 Inhaler 5  . amLODipine (NORVASC) 5 MG tablet Take 1 tablet (5 mg total) by mouth daily. 90 tablet 3  . beclomethasone (QVAR) 40 MCG/ACT inhaler Inhale 2 puffs into the lungs 2 (two) times daily. 1 Inhaler 11  . citalopram (CELEXA) 20 MG tablet 1/2 tablet a day qd x 7 days, then go up to 1 tab po qd 30 tablet 1  . medroxyPROGESTERone (PROVERA) 5 MG tablet Take 1 tab a day x 5 days every other month if no spontaneous menses (Patient not taking: Reported on 06/20/2015) 15 tablet 1   No current facility-administered medications for this visit.    Family History  Problem Relation Age of Onset  . Thyroid disease Sister   . Hyperlipidemia Mother   . Heart murmur Daughter   . Heart murmur Son   . Diabetes Neg Hx   . Heart murmur Daughter   . Asthma Daughter   . Cancer Cousin 46    female cousin--unknown primary, metastatic  . Pulmonary embolism Daughter 57  .  Hypertension Father     Review of Systems  Constitutional: Negative.   HENT: Negative.   Eyes: Negative.   Respiratory: Negative.   Cardiovascular: Negative.   Gastrointestinal: Negative.   Endocrine: Negative.   Genitourinary: Positive for menstrual problem.       Amenorrhea   Musculoskeletal: Negative.   Skin: Negative.   Allergic/Immunologic: Negative.   Neurological: Negative.   Psychiatric/Behavioral: Negative.     Exam:   BP 138/82 mmHg  Pulse 80  Resp 16  Ht 5\' 4"  (1.626 m)  Wt 237 lb (107.502 kg)  BMI 40.66 kg/m2  LMP 02/14/2015  Weight change: @WEIGHTCHANGE @ Height:   Height: 5\' 4"  (162.6 cm)  Ht Readings from Last 3 Encounters:  06/20/15 5\' 4"  (1.626 m)  05/18/15 5' 4.5" (1.638 m)  02/05/15 5\' 4"  (1.626 m)    General  appearance: alert, cooperative and appears stated age Head: Normocephalic, without obvious abnormality, atraumatic Neck: no adenopathy, supple, symmetrical, trachea midline and thyroid normal to inspection and palpation Lungs: clear to auscultation bilaterally Breasts: normal appearance, no masses or tenderness Heart: regular rate and rhythm Abdomen: soft, non-tender; bowel sounds normal; no masses,  no organomegaly Extremities: extremities normal, atraumatic, no cyanosis or edema Skin: Skin color, texture, turgor normal. No rashes or lesions Lymph nodes: Cervical, supraclavicular, and axillary nodes normal. No abnormal inguinal nodes palpated Neurologic: Grossly normal   Pelvic: External genitalia:  no lesions              Urethra:  normal appearing urethra with no masses, tenderness or lesions              Bartholins and Skenes: normal                 Vagina: normal appearing vagina with normal color and discharge, no lesions              Cervix: no lesions               Bimanual Exam:  Uterus:  normal size, contour, position, consistency, mobility, non-tender              Adnexa: no mass, fullness, tenderness               Rectovaginal: Confirms               Anus:  normal sphincter tone, no lesions  Chaperone was present for exam.  A:  Well Woman with normal exam  Anemia, hasn't started iron yet  Perimenopause, symptoms, including anxiety and vasomotor symptoms are much improved with celexa   P:   Return in 1 month for fasting labs: CBC, Ferritin, lipids, CMP, HgbA1C, vit d  Start iron today  Encouraged her to start the provera and to take every other month if no spontaneous menses  Pap with hpv  Mammogram UTD  Continue Celexa  Discussed breast self exam  Discussed calcium and vit D intake

## 2015-06-20 NOTE — Patient Instructions (Signed)
Breast Self-Awareness Practicing breast self-awareness may pick up problems early, prevent significant medical complications, and possibly save your life. By practicing breast self-awareness, you can become familiar with how your breasts look and feel and if your breasts are changing. This allows you to notice changes early. It can also offer you some reassurance that your breast health is good. One way to learn what is normal for your breasts and whether your breasts are changing is to do a breast self-exam. If you find a lump or something that was not present in the past, it is best to contact your caregiver right away. Other findings that should be evaluated by your caregiver include nipple discharge, especially if it is bloody; skin changes or reddening; areas where the skin seems to be pulled in (retracted); or new lumps and bumps. Breast pain is seldom associated with cancer (malignancy), but should also be evaluated by a caregiver. HOW TO PERFORM A BREAST SELF-EXAM The best time to examine your breasts is 5-7 days after your menstrual period is over. During menstruation, the breasts are lumpier, and it may be more difficult to pick up changes. If you do not menstruate, have reached menopause, or had your uterus removed (hysterectomy), you should examine your breasts at regular intervals, such as monthly. If you are breastfeeding, examine your breasts after a feeding or after using a breast pump. Breast implants do not decrease the risk for lumps or tumors, so continue to perform breast self-exams as recommended. Talk to your caregiver about how to determine the difference between the implant and breast tissue. Also, talk about the amount of pressure you should use during the exam. Over time, you will become more familiar with the variations of your breasts and more comfortable with the exam. A breast self-exam requires you to remove all your clothes above the waist. 1. Look at your breasts and nipples.  Stand in front of a mirror in a room with good lighting. With your hands on your hips, push your hands firmly downward. Look for a difference in shape, contour, and size from one breast to the other (asymmetry). Asymmetry includes puckers, dips, or bumps. Also, look for skin changes, such as reddened or scaly areas on the breasts. Look for nipple changes, such as discharge, dimpling, repositioning, or redness. 2. Carefully feel your breasts. This is best done either in the shower or tub while using soapy water or when flat on your back. Place the arm (on the side of the breast you are examining) above your head. Use the pads (not the fingertips) of your three middle fingers on your opposite hand to feel your breasts. Start in the underarm area and use  inch (2 cm) overlapping circles to feel your breast. Use 3 different levels of pressure (light, medium, and firm pressure) at each circle before moving to the next circle. The light pressure is needed to feel the tissue closest to the skin. The medium pressure will help to feel breast tissue a little deeper, while the firm pressure is needed to feel the tissue close to the ribs. Continue the overlapping circles, moving downward over the breast until you feel your ribs below your breast. Then, move one finger-width towards the center of the body. Continue to use the  inch (2 cm) overlapping circles to feel your breast as you move slowly up toward the collar bone (clavicle) near the base of the neck. Continue the up and down exam using all 3 pressures until you reach the   middle of the chest. Do this with each breast, carefully feeling for lumps or changes. 3.  Keep a written record with breast changes or normal findings for each breast. By writing this information down, you do not need to depend only on memory for size, tenderness, or location. Write down where you are in your menstrual cycle, if you are still menstruating. Breast tissue can have some lumps or  thick tissue. However, see your caregiver if you find anything that concerns you.  SEEK MEDICAL CARE IF:  You see a change in shape, contour, or size of your breasts or nipples.   You see skin changes, such as reddened or scaly areas on the breasts or nipples.   You have an unusual discharge from your nipples.   You feel a new lump or unusually thick areas.    This information is not intended to replace advice given to you by your health care provider. Make sure you discuss any questions you have with your health care provider.   Document Released: 01/22/2005 Document Revised: 01/09/2012 Document Reviewed: 05/09/2011 Elsevier Interactive Patient Education 2016 [employer]   EXERCISE AND DIET:  We recommended that you start or continue a regular exercise program for good health. Regular exercise means any activity that makes your heart beat faster and makes you sweat.  We recommend exercising at least 30 minutes per day at least 3 days a week, preferably 4 or 5.  We also recommend a diet low in fat and sugar.  Inactivity, poor dietary choices and obesity can cause diabetes, heart attack, stroke, and kidney damage, among others.    ALCOHOL AND SMOKING:  Women should limit their alcohol intake to no more than 7 drinks/beers/glasses of wine (combined, not each!) per week. Moderation of alcohol intake to this level decreases your risk of breast cancer and liver damage. And of course, no recreational drugs are part of a healthy lifestyle.  And absolutely no smoking or even second hand smoke. Most people know smoking can cause heart and lung diseases, but did you know it also contributes to weakening of your bones? Aging of your skin?  Yellowing of your teeth and nails?  CALCIUM AND VITAMIN D:  Adequate intake of calcium and Vitamin D are recommended.  The recommendations for exact amounts of these supplements seem to change often, but generally speaking 600 mg of calcium (either carbonate or  citrate) and 800 units of Vitamin D per day seems prudent. Certain women may benefit from higher intake of Vitamin D.  If you are among these women, your doctor will have told you during your visit.    PAP SMEARS:  Pap smears, to check for cervical cancer or precancers,  have traditionally been done yearly, although recent scientific advances have shown that most women can have pap smears less often.  However, every woman still should have a physical exam from her gynecologist every year. It will include a breast check, inspection of the vulva and vagina to check for abnormal growths or skin changes, a visual exam of the cervix, and then an exam to evaluate the size and shape of the uterus and ovaries.  And after 48 years of age, a rectal exam is indicated to check for rectal cancers. We will also provide age appropriate advice regarding health maintenance, like when you should have certain vaccines, screening for sexually transmitted diseases, bone density testing, colonoscopy, mammograms, etc.   MAMMOGRAMS:  All women over 40 years old should have a yearly   mammogram. Many facilities now offer a "3D" mammogram, which may cost around $50 extra out of pocket. If possible,  we recommend you accept the option to have the 3D mammogram performed.  It both reduces the number of women who will be called back for extra views which then turn out to be normal, and it is better than the routine mammogram at detecting truly abnormal areas.    COLONOSCOPY:  Colonoscopy to screen for colon cancer is recommended for all women at age 50.  We know, you hate the idea of the prep.  We agree, BUT, having colon cancer and not knowing it is worse!!  Colon cancer so often starts as a polyp that can be seen and removed at colonscopy, which can quite literally save your life!  And if your first colonoscopy is normal and you have no family history of colon cancer, most women don't have to have it again for 10 years.  Once every ten  years, you can do something that may end up saving your life, right?  We will be happy to help you get it scheduled when you are ready.  Be sure to check your insurance coverage so you understand how much it will cost.  It may be covered as a preventative service at no cost, but you should check your particular policy.      

## 2015-06-23 LAB — IPS PAP TEST WITH HPV

## 2015-06-26 ENCOUNTER — Other Ambulatory Visit: Payer: Self-pay | Admitting: Physician Assistant

## 2015-06-27 ENCOUNTER — Telehealth: Payer: Self-pay | Admitting: Emergency Medicine

## 2015-06-27 MED ORDER — METRONIDAZOLE 500 MG PO TABS: 500.0000 mg | ORAL_TABLET | Freq: Two times a day (BID) | ORAL | Status: DC

## 2015-06-27 NOTE — Telephone Encounter (Signed)
Message left to return call to Deepti Gunawan at [phone] or triage nurse at [phone] 

## 2015-06-27 NOTE — Telephone Encounter (Signed)
Patient returned call and message from Dr. Oscar LaJertson discussed.  Patient states she would like to think about her options and then discuss them with Dr. Oscar LaJertson at her follow up appointment that she has scheduled for 07/18/15.   08 recall entered for PAP with cotesting.  Discussed bacterial vaginosis results. Patient states she is having some vaginal discharge and would like treatment.  Prefers oral treatment due to cost.  Instructions given regarding Flagyl. Do not mix with alcohol. If mixed with alcohol can cause severe nausea, vomiting and severe abdominal cramping. Avoid alcohol for 72 hours prior to starting medication, during treatment and 72 hours after completing medication. Normal side effects of flagyl may include a metallic taste in the mouth, slight nausea, headache, abdominal cramping, diarrhea and dizziness.     Patient verbalized understanding of results and will keep appointment for follow up as scheduled.  Routing to provider for final review. Patient agreeable to disposition. Will close encounter.

## 2015-06-27 NOTE — Telephone Encounter (Signed)
-----   Message from Romualdo BolkJill Evelyn Jertson, MD sent at 06/24/2015  1:39 PM EDT ----- Please inform the Patient that her pap returned with LSIL, but with negative high risk HPV. HPV still causes the virus, but it isn't one of the strains that is typically associated with cervical cancer. We can either do a colposcopy this year, or wait and do another pap with hpv next year. Please review the choices with her. She is also + for BV, if she is symptomatic, or if she is having the colposcopy, please treat with flagyl 500 mg po BID x 7 days, or metrogel, 1 applicator qhs x 5 days (no etoh with either). Thanks.

## 2015-06-27 NOTE — Telephone Encounter (Signed)
Please make sure she is on the pap recall for next year

## 2015-06-28 NOTE — Telephone Encounter (Signed)
08 Pap Recall has been entered.

## 2015-07-13 ENCOUNTER — Telehealth: Payer: Self-pay | Admitting: Obstetrics and Gynecology

## 2015-07-13 NOTE — Telephone Encounter (Signed)
Patient called and left a message during lunch cancelling her appointment for the lab on 07/18/15 due to being out of town. She will call back to reschedule. I also called her back and left her a message to confirm.

## 2015-07-18 ENCOUNTER — Other Ambulatory Visit: Payer: Federal, State, Local not specified - PPO

## 2016-01-02 ENCOUNTER — Ambulatory Visit (INDEPENDENT_AMBULATORY_CARE_PROVIDER_SITE_OTHER): Payer: Federal, State, Local not specified - PPO | Admitting: Physician Assistant

## 2016-01-02 VITALS — BP 132/88 | HR 87 | Temp 98.6°F | Resp 16 | Ht 64.0 in | Wt 237.0 lb

## 2016-01-02 DIAGNOSIS — J45901 Unspecified asthma with (acute) exacerbation: Secondary | ICD-10-CM | POA: Diagnosis not present

## 2016-01-02 DIAGNOSIS — R059 Cough, unspecified: Secondary | ICD-10-CM

## 2016-01-02 DIAGNOSIS — R05 Cough: Secondary | ICD-10-CM | POA: Diagnosis not present

## 2016-01-02 MED ORDER — AZELASTINE HCL 0.15 % NA SOLN: 2.0000 | Freq: Two times a day (BID) | NASAL | 0 refills | Status: DC

## 2016-01-02 MED ORDER — AMOXICILLIN-POT CLAVULANATE 875-125 MG PO TABS: 1.0000 | ORAL_TABLET | Freq: Two times a day (BID) | ORAL | 0 refills | Status: AC

## 2016-01-02 MED ORDER — BENZONATATE 100 MG PO CAPS: 100.0000 mg | ORAL_CAPSULE | Freq: Three times a day (TID) | ORAL | 0 refills | Status: DC | PRN

## 2016-01-02 NOTE — Patient Instructions (Addendum)
INCREASE the steroid inhaler to 2 puffs 4 times a day. If you're not improving in the next 48 hours, of if you worsen, we'll plan to send in oral prednisone.    IF you received an x-ray today, [facility] will [facility]. Please contact [facility] at [phone] with questions or concerns regarding your invoice.   IF you received labwork today, you will receive an invoice from [facility]. Please contact [facility] at [phone] with questions or concerns regarding your invoice.   Our billing staff will not be able to assist you with questions regarding bills from these companies.  You will be contacted with the lab results as soon as they are available. The fastest way to get your results is to activate your My Chart account. Instructions are located on the last page of this paperwork. If you have not heard from us regarding the results in 2 weeks, please contact this office.

## 2016-01-02 NOTE — Progress Notes (Signed)
Patient ID: [name], female    DOB: 01/14/1966, 50 y.o.   MRN: [id number]  PCP: No primary care provider on file.  Chief Complaint  Patient presents with  . Sore Throat    x 1 wk  . chest congestion    dark mucus/ x 1wk, pt has hx of asthma  . Flank Pain    both left and right ribs/ x 1wk    Subjective:   Presents for evaluation of sore throat, congestion and cough x 2 weeks.  First symptom was a "catching in my throat." She initially thought it was irritation from a new perfume spray, and notes that her seasonal allergies flare this time of year and typically trigger asthma symptoms.  Cough is productive of dark sputum. Some chest tightness, SOB, wheezing. Subjective fever/chills, ear fullness, sinus pressure/pain, lightheadedness. Attributes an episode of diarrhea yesterday to stress related to visiting a close friend in hospital yesterday who has since died.  Acetaminophen and ibuprofen with some relief. Albuterol reduce inhaler 3-4x/day in the past week. Has not been using CPAP due to nasal congestion.  No known sick contacts. Has not had the seasonal influenza vaccine.    Review of Systems Constitutional: Positive for chills, diaphoresis, fatigue and fever.  HENT: Positive for congestion, ear pain, nosebleeds, postnasal drip, rhinorrhea, sinus pain, sinus pressure and sore throat. Negative for ear discharge, facial swelling, mouth sores and tinnitus.   Eyes: Negative for pain, discharge, redness and itching.  Respiratory: Positive for cough, chest tightness, shortness of breath and wheezing.   Cardiovascular: Negative for chest pain and palpitations.  Gastrointestinal: Positive for abdominal pain and diarrhea. Negative for blood in stool, constipation, nausea and vomiting.  Genitourinary: Negative for difficulty urinating, dysuria, frequency, hematuria and urgency.  Neurological: Positive for dizziness, light-headedness and headaches.     Patient Active  Problem List   Diagnosis Date Noted  . Asthma exacerbation, mild 01/31/2014  . Snoring 05/18/2013  . Accelerated hypertension 05/18/2013  . Essential hypertension, benign 06/05/2011  . Diabetes mellitus without mention of complication 06/05/2011  . Asthma 07/21/2010  . Heart murmur 07/21/2010  . Cushing's syndrome (HCC) 07/21/2010  . Hyperlipidemia 07/21/2010  . Rhinitis, allergic 07/21/2010     Prior to Admission medications   Medication Sig Start Date End Date Taking? Authorizing Provider  albuterol (PROAIR HFA) 108 (90 Base) MCG/ACT inhaler INHALE 2 PUFFS INTO THE LUNGS EVERY 4 (FOUR) HOURS AS NEEDED FOR WHEEZING OR SHORTNESS OF BREATH. 05/18/15  Yes Collene GobbleSteven A Daub, MD  amLODipine (NORVASC) 5 MG tablet Take 1 tablet (5 mg total) by mouth daily. 05/18/15  Yes Collene GobbleSteven A Daub, MD  beclomethasone (QVAR) 40 MCG/ACT inhaler Inhale 2 puffs into the lungs 2 (two) times daily. 05/18/15  Yes Collene GobbleSteven A Daub, MD  citalopram (CELEXA) 20 MG tablet 1 tab po qd 06/20/15  Yes Romualdo BolkJill Evelyn Jertson, MD  benzonatate (TESSALON) 100 MG capsule Take 1-2 capsules (100-200 mg total) by mouth 3 (three) times daily as needed for cough. 01/02/16   Elion Hocker, PA-C  medroxyPROGESTERone (PROVERA) 5 MG tablet Take 1 tab a day x 5 days every other month if no spontaneous menses Patient not taking: Reported on 01/02/2016 05/24/15   Romualdo BolkJill Evelyn Jertson, MD     Allergies  Allergen Reactions  . Bactrim [Sulfamethoxazole-Trimethoprim]     Lip swelling, mouth itching  . Doxycycline     Severe headache  . Rocephin [Ceftriaxone Sodium] Hives, Itching and Other (See Comments)    wheezing  Objective:  Physical Exam  Constitutional: She is oriented to person, place, and time. She appears well-developed and well-nourished. No distress.  BP 132/88   Pulse 87   Temp 98.6 F (37 C) (Oral)   Resp 16   Ht 5\' 4"  (1.626 m)   Wt 237 lb (107.5 kg)   LMP 12/26/2015   SpO2 99%   BMI 40.68 kg/m    HENT:  Head:  Normocephalic and atraumatic.  Right Ear: Hearing, tympanic membrane, external ear and ear canal normal.  Left Ear: Hearing, tympanic membrane, external ear and ear canal normal.  Nose: Mucosal edema and rhinorrhea present.  No foreign bodies. Right sinus exhibits no maxillary sinus tenderness and no frontal sinus tenderness. Left sinus exhibits no maxillary sinus tenderness and no frontal sinus tenderness.  Mouth/Throat: Uvula is midline, oropharynx is clear and moist and mucous membranes are normal. No uvula swelling. No oropharyngeal exudate.  Eyes: Conjunctivae and EOM are normal. Pupils are equal, round, and reactive to light. Right eye exhibits no discharge. Left eye exhibits no discharge. No scleral icterus.  Neck: Trachea normal, normal range of motion and full passive range of motion without pain. Neck supple. No thyroid mass and no thyromegaly present.  Cardiovascular: Normal rate, regular rhythm and normal heart sounds.   Pulmonary/Chest: Effort normal. She has no decreased breath sounds. She has wheezes (scattered, coarse).  Lymphadenopathy:       Head (right side): Submandibular and tonsillar adenopathy present. No preauricular, no posterior auricular and no occipital adenopathy present.       Head (left side): Submandibular and tonsillar adenopathy present. No preauricular and no occipital adenopathy present.    She has no cervical adenopathy.       Right: No supraclavicular adenopathy present.       Left: No supraclavicular adenopathy present.  Neurological: She is alert and oriented to person, place, and time. She has normal strength. No cranial nerve deficit or sensory deficit.  Skin: Skin is warm, dry and intact. No rash noted.  Psychiatric: She has a normal mood and affect. Her speech is normal and behavior is normal.           Assessment & Plan:   1. Cough 2. Asthma exacerbation, mild Suspect sinusitis triggering asthma exacerbation. INCREASE steroid inhaler to 2  puffs TID, then reduce when symptoms are controlled. Try to avoid oral steroid, but if no improvement in 48 hours, would send in prednisone taper. - amoxicillin-clavulanate (AUGMENTIN) 875-125 MG tablet; Take 1 tablet by mouth 2 (two) times daily.  Dispense: 20 tablet; Refill: 0 - Azelastine HCl 0.15 % SOLN; Place 2 sprays into both nostrils 2 (two) times daily.  Dispense: 30 mL; Refill: 0 - benzonatate (TESSALON) 100 MG capsule; Take 1-2 capsules (100-200 mg total) by mouth 3 (three) times daily as needed for cough.  Dispense: 40 capsule; Refill: 0    Fernande Brashelle S. Milady Fleener, PA-C Physician Assistant-Certified [facility]

## 2016-01-02 NOTE — Progress Notes (Signed)
Subjective:    Patient ID: [name], female    DOB: 07/30/1965, 50 y.o.   MRN: [id number]  Chief Complaint  Patient presents with  . Sore Throat    x 1 wk  . chest congestion    dark mucus/ x 1wk, pt has hx of asthma  . Flank Pain    both left and right ribs/ x 1wk   HPI: Presents for sore throat, cough, and congestion which began 13 days ago. States it began with a "catching in my throat" and scratchy throat which she first noticed when a coworker of hers was spraying a new perfume. She has a history of environmental allergies and asthma which often flares around this time of year. Associated cough and congestion began 10 days ago, with associated dark sputum for the past week. Patient states it is dark brown colored and sometimes blood-tinged, which she occasionally gets with her allergies and asthma exacerbations. Endorses chest tightness and some pain associated with aggressive coughing. In addition, endorses ear fullness, post-nasal drip, sinus pain and pressure, and SOB and wheezing. Other associated symptoms include subjective fevers and chills, night sweats, lightheadedness and dizziness, and diarrhea, however, she attributes most of these to her "nerves" because she was in the hospital yesterday with a friend who passed away.   She has been alternating Tylenol cold and sinus with Advil which has provided some relief and allowed her to get some sleep. Has had to use her Albuterol inhaler 3-4x/day for the past week. Also has been drinking tea, using cough drops, and Tessalon perles which she had at home. Wears CPAP at night for sleep apnea but has not done so recently with this illness.  Denies sick contacts. Has not received flu vaccine.  Review of Systems  Constitutional: Positive for chills, diaphoresis, fatigue and fever.  HENT: Positive for congestion, ear pain, nosebleeds, postnasal drip, rhinorrhea, sinus pain, sinus pressure and sore throat. Negative for ear discharge,  facial swelling, mouth sores and tinnitus.   Eyes: Negative for pain, discharge, redness and itching.  Respiratory: Positive for cough, chest tightness, shortness of breath and wheezing.   Cardiovascular: Negative for chest pain and palpitations.  Gastrointestinal: Positive for abdominal pain and diarrhea. Negative for blood in stool, constipation, nausea and vomiting.  Genitourinary: Negative for difficulty urinating, dysuria, frequency, hematuria and urgency.  Neurological: Positive for dizziness, light-headedness and headaches.   Allergies  Allergen Reactions  . Bactrim [Sulfamethoxazole-Trimethoprim]     Lip swelling, mouth itching  . Doxycycline     Severe headache  . Rocephin [Ceftriaxone Sodium] Hives, Itching and Other (See Comments)    wheezing   Prior to Admission medications   Medication Sig Start Date End Date Taking? Authorizing Provider  albuterol (PROAIR HFA) 108 (90 Base) MCG/ACT inhaler INHALE 2 PUFFS INTO THE LUNGS EVERY 4 (FOUR) HOURS AS NEEDED FOR WHEEZING OR SHORTNESS OF BREATH. 05/18/15  Yes Collene GobbleSteven A Daub, MD  amLODipine (NORVASC) 5 MG tablet Take 1 tablet (5 mg total) by mouth daily. 05/18/15  Yes Collene GobbleSteven A Daub, MD  beclomethasone (QVAR) 40 MCG/ACT inhaler Inhale 2 puffs into the lungs 2 (two) times daily. 05/18/15  Yes Collene GobbleSteven A Daub, MD  citalopram (CELEXA) 20 MG tablet 1 tab po qd 06/20/15  Yes Romualdo BolkJill Evelyn Jertson, MD  medroxyPROGESTERone (PROVERA) 5 MG tablet Take 1 tab a day x 5 days every other month if no spontaneous menses Patient not taking: Reported on 01/02/2016 05/24/15   Romualdo BolkJill Evelyn Jertson, MD  Patient Active Problem List   Diagnosis Date Noted  . Asthma exacerbation, mild 01/31/2014  . Snoring 05/18/2013  . Accelerated hypertension 05/18/2013  . Essential hypertension, benign 06/05/2011  . Diabetes mellitus without mention of complication 06/05/2011  . Asthma 07/21/2010  . Heart murmur 07/21/2010  . Cushing's syndrome (HCC) 07/21/2010  .  Hyperlipidemia 07/21/2010  . Rhinitis, allergic 07/21/2010       Objective: Blood pressure 132/88, pulse 87, temperature 98.6 F (37 C), temperature source Oral, resp. rate 16, height 5\' 4"  (1.626 m), weight 237 lb (107.5 kg), last menstrual period 12/26/2015, SpO2 99 %.   Physical Exam  Constitutional: She is oriented to person, place, and time. She appears well-developed and well-nourished. No distress.  HENT:  Head: Normocephalic and atraumatic.  Right Ear: External ear normal. No drainage, swelling or tenderness. Tympanic membrane is not injected, not scarred, not perforated, not erythematous, not retracted and not bulging. No middle ear effusion.  Left Ear: External ear normal. No drainage, swelling or tenderness. Tympanic membrane is not injected, not scarred, not perforated, not erythematous, not retracted and not bulging.  No middle ear effusion.  Nose: Mucosal edema and rhinorrhea present. No nose lacerations, sinus tenderness, nasal deformity, septal deviation or nasal septal hematoma. No epistaxis.  No foreign bodies. Right sinus exhibits maxillary sinus tenderness. Right sinus exhibits no frontal sinus tenderness. Left sinus exhibits maxillary sinus tenderness. Left sinus exhibits no frontal sinus tenderness.  Mouth/Throat: Uvula is midline and oropharynx is clear and moist. Mucous membranes are not pale, not dry and not cyanotic. No oral lesions. Normal dentition. No uvula swelling or dental caries. No oropharyngeal exudate, posterior oropharyngeal edema, posterior oropharyngeal erythema or tonsillar abscesses.  Periorbital swelling, patient states it is due to crying from her friend who passed away.  Eyes: Conjunctivae are normal. Pupils are equal, round, and reactive to light. Right eye exhibits no discharge and no exudate. Left eye exhibits no discharge and no exudate. Right conjunctiva is not injected. Left conjunctiva is not injected. No scleral icterus. Right pupil is round and  reactive. Left pupil is round and reactive. Pupils are equal.  Neck: Normal range of motion. Neck supple. No neck rigidity. No tracheal deviation and normal range of motion present. No thyromegaly present.  Cardiovascular: Normal rate, regular rhythm and intact distal pulses.  Exam reveals no gallop and no friction rub.   Murmur heard.  Systolic murmur is present with a grade of 2/6  Pulses:      Radial pulses are 2+ on the right side, and 2+ on the left side.  Pulmonary/Chest: Effort normal. No accessory muscle usage or stridor. No respiratory distress. She has no decreased breath sounds. She has wheezes in the right upper field and the left upper field. She has no rhonchi. She has no rales.  Musculoskeletal: Normal range of motion.  Lymphadenopathy:       Head (right side): Submandibular and tonsillar adenopathy present. No submental, no preauricular and no posterior auricular adenopathy present.       Head (left side): Submandibular and tonsillar adenopathy present. No submental, no preauricular and no posterior auricular adenopathy present.    She has no cervical adenopathy.  Lymphadenopathy and tenderness noted upon palpation of submandibular and tonsillar lymph nodes.  Neurological: She is alert and oriented to person, place, and time. No cranial nerve deficit. Coordination normal.  Skin: Skin is warm and dry. She is not diaphoretic.  Psychiatric: She has a normal mood and affect. Her behavior is  normal.  Crying and tearful upon exam due to recent death of friend.       Assessment & Plan:  1. Cough Rx Augmentin, Azelastine HCl nasal spray, and Tessalon perles. Advised patient to increase fluid intake and rest. RTC if symptoms are not improving or worsening. - amoxicillin-clavulanate (AUGMENTIN) 875-125 MG tablet; Take 1 tablet by mouth 2 (two) times daily.  Dispense: 20 tablet; Refill: 0 - Azelastine HCl 0.15 % SOLN; Place 2 sprays into both nostrils 2 (two) times daily.  Dispense: 30  mL; Refill: 0 - benzonatate (TESSALON) 100 MG capsule; Take 1-2 capsules (100-200 mg total) by mouth 3 (three) times daily as needed for cough.  Dispense: 40 capsule; Refill: 0  2. Asthma exacerbation, mild Instructed patient to increase Qvar inhaler to 2 puffs FOUR times a day for symptomatic relief. If symptoms are not improving with this and medications above, advised to call or RTC and will consider addition of Prednisone at that time.

## 2016-02-26 ENCOUNTER — Other Ambulatory Visit: Payer: Self-pay | Admitting: Emergency Medicine

## 2016-03-23 DIAGNOSIS — Z1231 Encounter for screening mammogram for malignant neoplasm of breast: Secondary | ICD-10-CM | POA: Diagnosis not present

## 2016-03-23 LAB — HM MAMMOGRAPHY

## 2016-04-15 ENCOUNTER — Other Ambulatory Visit: Payer: Self-pay | Admitting: Emergency Medicine

## 2016-04-19 ENCOUNTER — Ambulatory Visit (INDEPENDENT_AMBULATORY_CARE_PROVIDER_SITE_OTHER): Payer: Federal, State, Local not specified - PPO | Admitting: Physician Assistant

## 2016-04-19 VITALS — BP 126/82 | HR 92 | Temp 98.9°F | Resp 18 | Ht 64.0 in | Wt 238.0 lb

## 2016-04-19 DIAGNOSIS — R0982 Postnasal drip: Secondary | ICD-10-CM

## 2016-04-19 DIAGNOSIS — J0100 Acute maxillary sinusitis, unspecified: Secondary | ICD-10-CM

## 2016-04-19 MED ORDER — FLUTICASONE PROPIONATE HFA 44 MCG/ACT IN AERO: 2.0000 | INHALATION_SPRAY | Freq: Two times a day (BID) | RESPIRATORY_TRACT | 12 refills | Status: DC

## 2016-04-19 MED ORDER — BENZONATATE 100 MG PO CAPS: 100.0000 mg | ORAL_CAPSULE | Freq: Three times a day (TID) | ORAL | 0 refills | Status: DC | PRN

## 2016-04-19 MED ORDER — GUAIFENESIN ER 1200 MG PO TB12: 1.0000 | ORAL_TABLET | Freq: Two times a day (BID) | ORAL | 1 refills | Status: DC | PRN

## 2016-04-19 MED ORDER — AMOXICILLIN-POT CLAVULANATE 875-125 MG PO TABS: 1.0000 | ORAL_TABLET | Freq: Two times a day (BID) | ORAL | 0 refills | Status: AC

## 2016-04-19 NOTE — Patient Instructions (Addendum)
Please make sure you are hydrating well with 64 oz of water or more.  This is the only way the mucinex will work, and not make you feel worse or dry. You can use the proair every 4 hours for the next 24 hours. Start using the maintenance inhaler.   Sinusitis, Adult Sinusitis is soreness and inflammation of your sinuses. Sinuses are hollow spaces in the bones around your face. They are located:  Around your eyes.  In the middle of your forehead.  Behind your nose.  In your cheekbones. Your sinuses and nasal passages are lined with a stringy fluid (mucus). Mucus normally drains out of your sinuses. When your nasal tissues get inflamed or swollen, the mucus can get trapped or blocked so air cannot flow through your sinuses. This lets bacteria, viruses, and funguses grow, and that leads to infection. Follow these instructions at home: Medicines   Take, use, or apply over-the-counter and prescription medicines only as told by your doctor. These may include nasal sprays.  If you were prescribed an antibiotic medicine, take it as told by your doctor. Do not stop taking the antibiotic even if you start to feel better. Hydrate and Humidify   Drink enough water to keep your pee (urine) clear or pale yellow.  Use a cool mist humidifier to keep the humidity level in your home above 50%.  Breathe in steam for 10-15 minutes, 3-4 times a day or as told by your doctor. You can do this in the bathroom while a hot shower is running.  Try not to spend time in cool or dry air. Rest   Rest as much as possible.  Sleep with your head raised (elevated).  Make sure to get enough sleep each night. General instructions   Put a warm, moist washcloth on your face 3-4 times a day or as told by your doctor. This will help with discomfort.  Wash your hands often with soap and water. If there is no soap and water, use hand sanitizer.  Do not smoke. Avoid being around people who are smoking (secondhand  smoke).  Keep all follow-up visits as told by your doctor. This is important. Contact a doctor if:  You have a fever.  Your symptoms get worse.  Your symptoms do not get better within 10 days. Get help right away if:  You have a very bad headache.  You cannot stop throwing up (vomiting).  You have pain or swelling around your face or eyes.  You have trouble seeing.  You feel confused.  Your neck is stiff.  You have trouble breathing. This information is not intended to replace advice given to you by your health care provider. Make sure you discuss any questions you have with your health care provider. Document Released: 07/11/2007 Document Revised: 09/18/2015 Document Reviewed: 11/17/2014 Elsevier Interactive Patient Education  2017 [employer]    IF you received an x-ray today, you will receive an invoice from [facility]. Please contact [facility] at [phone] with questions or concerns regarding your invoice.   IF you received labwork today, you will receive an invoice from [facility]. Please contact [facility] at [phone] with questions or concerns regarding your invoice.   Our billing staff will not be able to assist you with questions regarding bills from these companies.  You will be contacted with the lab results as soon as they are available. The fastest way to get your results is to activate your My Chart account. Instructions are located on  the last page of this paperwork. If you have not heard from [place] regarding the results in 2 weeks, please contact this office.

## 2016-04-19 NOTE — Progress Notes (Signed)
Get my name taken off of her chart

## 2016-04-19 NOTE — Progress Notes (Signed)
[facility] [address], [place] [phone]  Date:  04/19/2016   Name:  [name]   DOB:  12/28/1965   MRN:  [id number]  PCP:  Carollee HerterLALONDE,JOHN CHARLES, MD    History of Present Illness:  [name] is a 51 y.o. female patient who presents to PCP with  Chief Complaint  Patient presents with  . Headache  . Shortness of Breath     Patient is here today with complaint of headache, congestion, and rib pain.  Patient reports that yesterday she started sneezing.  By the end of the day, she developed headache, facial pain, and nasal congestion.  She has mucus thick and yellow.  Mild nose bleeds.  Left ear pain, body aches, and felt subjective chills, however no fever.  Rib pain with coughing.  She is currently coughing non-productive.  She is not on her maintenance dose of qvar as this is out of stock.  She is waiting for a change in medicine by pharmacy.  Patient Active Problem List   Diagnosis Date Noted  . Asthma exacerbation, mild 01/31/2014  . Snoring 05/18/2013  . Accelerated hypertension 05/18/2013  . Essential hypertension, benign 06/05/2011  . Diabetes mellitus without mention of complication 06/05/2011  . Asthma 07/21/2010  . Heart murmur 07/21/2010  . Cushing's syndrome (HCC) 07/21/2010  . Hyperlipidemia 07/21/2010  . Rhinitis, allergic 07/21/2010    Past Medical History:  Diagnosis Date  . Allergy   . Asthma   . Cushing syndrome (HCC)    ?-pt states she was given this diagnosis during a hospitalization.  Records reviewed, no mention of dx or treatment  . Diabetes mellitus   . Heart murmur    mild TR  . Hyperlipidemia   . Hypertension   . Sleep apnea     Past Surgical History:  Procedure Laterality Date  . CERVICAL CONIZATION W/BX  1999  . CHOLECYSTECTOMY N/A 01/31/2014   Procedure: LAPAROSCOPIC CHOLECYSTECTOMY ;  Surgeon: Abigail Miyamotoouglas Blackman, MD;  Location: [facility] OR;  Service: General;  Laterality: N/A;  . GALLBLADDER SURGERY  Jan 31, 2014  . TUBAL LIGATION  01/10/2000    Social History  Substance Use Topics  . Smoking status: Former Smoker    Packs/day: 0.25    Types: Cigarettes    Quit date: 02/21/2012  . Smokeless tobacco: Never Used  . Alcohol use No    Family History  Problem Relation Age of Onset  . Thyroid disease Sister   . Hyperlipidemia Mother   . Heart murmur Daughter   . Heart murmur Son   . Heart murmur Daughter   . Asthma Daughter   . Hypertension Father   . Cancer Cousin [id number]    female cousin--unknown primary, metastatic  . Pulmonary embolism Daughter [phone]  . Diabetes Neg Hx     Allergies  Allergen Reactions  . Bactrim [Sulfamethoxazole-Trimethoprim]     Lip swelling, mouth itching  . Doxycycline     Severe headache  . Rocephin [Ceftriaxone Sodium] Hives, Itching and Other (See Comments)    wheezing    Medication list has been reviewed and updated.  Current Outpatient Prescriptions on File Prior to Visit  Medication Sig Dispense Refill  . amLODipine (NORVASC) 5 MG tablet Take 1 tablet (5 mg total) by mouth daily. 90 tablet 3  . Azelastine HCl 0.15 % SOLN Place 2 sprays into both nostrils 2 (two) times daily. 30 mL 0  . beclomethasone (QVAR) 40 MCG/ACT inhaler Inhale 2 puffs  into the lungs 2 (two) times daily. 1 Inhaler 11  . citalopram (CELEXA) 20 MG tablet 1 tab po qd 90 tablet 3  . medroxyPROGESTERone (PROVERA) 5 MG tablet Take 1 tab a day x 5 days every other month if no spontaneous menses 15 tablet 1  . PROAIR HFA 108 (90 Base) MCG/ACT inhaler INHALE 2 PUFFS INTO THE LUNGS EVERY 4 (FOUR) HOURS AS NEEDED FOR WHEEZING OR SHORTNESS OF BREATH. 8.5 Inhaler 0   No current facility-administered medications on file prior to visit.     ROS ROS otherwise unremarkable unless listed above.  Physical Examination: BP 126/82 (BP Location: Right Arm, Patient Position: Sitting, Cuff Size: Large)   Pulse 92   Temp 98.9 F (37.2 C) (Oral)   Resp 18   Ht 5\' 4"  (1.626 m)   Wt 238 lb (108  kg)   LMP 04/05/2016   SpO2 98%   BMI 40.85 kg/m  Ideal Body Weight: Weight in (lb) to have BMI = 25: 145.3  Physical Exam  Constitutional: She is oriented to person, place, and time. She appears well-developed and well-nourished. No distress.  HENT:  Head: Normocephalic and atraumatic.  Right Ear: Tympanic membrane, external ear and ear canal normal.  Left Ear: Tympanic membrane, external ear and ear canal normal.  Nose: Mucosal edema and rhinorrhea present. Right sinus exhibits maxillary sinus tenderness. Right sinus exhibits no frontal sinus tenderness. Left sinus exhibits maxillary sinus tenderness. Left sinus exhibits no frontal sinus tenderness.  Mouth/Throat: No uvula swelling. No oropharyngeal exudate, posterior oropharyngeal edema or posterior oropharyngeal erythema.  Eyes: Conjunctivae and EOM are normal. Pupils are equal, round, and reactive to light.  Cardiovascular: Normal rate and regular rhythm.  Exam reveals no gallop, no distant heart sounds and no friction rub.   No murmur heard. Pulmonary/Chest: Effort normal. No respiratory distress. She has no decreased breath sounds. She has no wheezes. She has no rhonchi.  Lymphadenopathy:       Head (right side): No submandibular, no tonsillar, no preauricular and no posterior auricular adenopathy present.       Head (left side): No submandibular, no tonsillar, no preauricular and no posterior auricular adenopathy present.  Neurological: She is alert and oriented to person, place, and time.  Skin: She is not diaphoretic.  Psychiatric: She has a normal mood and affect. Her behavior is normal.     Assessment and Plan: [name] is a 51 y.o. female who is here today for congestion, headache, and facial pain. Possible bacterial etiology.  Will proceed to treat with Augmentin Given a preventative medication change at this time to Flovent.  Follow up as needed. Subacute maxillary sinusitis - Plan: fluticasone (FLOVENT HFA) 44  MCG/ACT inhaler, Guaifenesin (MUCINEX MAXIMUM STRENGTH) 1200 MG TB12, benzonatate (TESSALON) 100 MG capsule, amoxicillin-clavulanate (AUGMENTIN) 875-125 MG tablet  Post-nasal drip - Plan: Guaifenesin (MUCINEX MAXIMUM STRENGTH) 1200 MG TB12, amoxicillin-clavulanate (AUGMENTIN) 875-125 MG tablet  Trena Platt, PA-C [facility] 3/15/20181:47 PM

## 2016-06-26 ENCOUNTER — Telehealth: Payer: Self-pay | Admitting: *Deleted

## 2016-06-26 NOTE — Telephone Encounter (Signed)
Patient in 08 recall- Left message to call and schedule AEX /PAP -eh

## 2016-07-04 NOTE — Telephone Encounter (Signed)
Left patient another message regarding 08 recall. Patient needs AEX/PAP-eh

## 2016-07-11 ENCOUNTER — Other Ambulatory Visit: Payer: Self-pay | Admitting: Emergency Medicine

## 2016-07-11 NOTE — Telephone Encounter (Signed)
Please call patient. Rx authorized. Needs to schedule follow-up and establish with PCP.  Meds ordered this encounter  Medications  . amLODipine (NORVASC) 5 MG tablet    Sig: TAKE 1 TABLET (5 MG TOTAL) BY MOUTH DAILY.    Dispense:  90 tablet    Refill:  0    Please notify patient that s/he needs an office visit +/- labsfor additional refills.

## 2016-07-23 NOTE — Telephone Encounter (Signed)
Please send a certified letter recommending she come in for a AEX/Pap, then close the encounter.

## 2016-07-23 NOTE — Telephone Encounter (Signed)
Left another message for patient to call and schedule AEX/PAP. Patient has not returned call. Please advise on recall status/letter Thanks Consuella LoseElaine

## 2016-07-25 ENCOUNTER — Encounter: Payer: Self-pay | Admitting: *Deleted

## 2016-07-25 NOTE — Telephone Encounter (Signed)
Letter sent-eh 

## 2016-08-24 ENCOUNTER — Other Ambulatory Visit: Payer: Self-pay | Admitting: Physician Assistant

## 2016-08-31 ENCOUNTER — Other Ambulatory Visit: Payer: Self-pay | Admitting: Obstetrics and Gynecology

## 2016-08-31 NOTE — Telephone Encounter (Signed)
Medication refill request: Celexa  Last AEX:  06/20/15 JJ  Next AEX: none Last MMG (if hormonal medication request): 03/23/16 BIRADS2:benign  Refill authorized: 06/20/15 #[id number].   Today denied. Patient needs AEX. 08 recall.

## 2016-10-09 ENCOUNTER — Other Ambulatory Visit: Payer: Self-pay | Admitting: Physician Assistant

## 2016-10-14 ENCOUNTER — Other Ambulatory Visit: Payer: Self-pay | Admitting: Physician Assistant

## 2016-10-16 NOTE — Telephone Encounter (Signed)
Left detailed message patient needs a follow up appointment for refills

## 2016-11-20 ENCOUNTER — Telehealth: Payer: Self-pay | Admitting: Physician Assistant

## 2016-11-20 MED ORDER — AMLODIPINE BESYLATE 5 MG PO TABS: 5.0000 mg | ORAL_TABLET | Freq: Every day | ORAL | 0 refills | Status: DC

## 2016-11-20 NOTE — Telephone Encounter (Signed)
Pharmacy requesting 90-supply of amlodipine.  Meds ordered this encounter  Medications  . amLODipine (NORVASC) 5 MG tablet    Sig: Take 1 tablet (5 mg total) by mouth daily.    Dispense:  30 tablet    Refill:  0    Patient is due for follow-up. This is 2nd notice. Unable to consider 90-day supply without office visit.    Order Specific Question:   Supervising Provider    Answer:   Clelia Croft, EVA N [[id number]]   Please contact patient to schedule follow-up HTN.

## 2016-12-28 ENCOUNTER — Other Ambulatory Visit: Payer: Self-pay | Admitting: Physician Assistant

## 2017-02-06 ENCOUNTER — Other Ambulatory Visit: Payer: Self-pay | Admitting: Physician Assistant

## 2017-02-06 DIAGNOSIS — R05 Cough: Secondary | ICD-10-CM

## 2017-02-06 DIAGNOSIS — R059 Cough, unspecified: Secondary | ICD-10-CM

## 2017-02-08 ENCOUNTER — Ambulatory Visit (INDEPENDENT_AMBULATORY_CARE_PROVIDER_SITE_OTHER): Payer: Federal, State, Local not specified - PPO | Admitting: Physician Assistant

## 2017-02-08 ENCOUNTER — Encounter: Payer: Self-pay | Admitting: Physician Assistant

## 2017-02-08 ENCOUNTER — Other Ambulatory Visit: Payer: Self-pay

## 2017-02-08 VITALS — BP 132/70 | HR 87 | Temp 98.6°F | Resp 16 | Ht 64.0 in | Wt 256.8 lb

## 2017-02-08 DIAGNOSIS — Z13228 Encounter for screening for other metabolic disorders: Secondary | ICD-10-CM | POA: Diagnosis not present

## 2017-02-08 DIAGNOSIS — Z1211 Encounter for screening for malignant neoplasm of colon: Secondary | ICD-10-CM

## 2017-02-08 DIAGNOSIS — Z124 Encounter for screening for malignant neoplasm of cervix: Secondary | ICD-10-CM

## 2017-02-08 DIAGNOSIS — Z1329 Encounter for screening for other suspected endocrine disorder: Secondary | ICD-10-CM | POA: Diagnosis not present

## 2017-02-08 DIAGNOSIS — R718 Other abnormality of red blood cells: Secondary | ICD-10-CM

## 2017-02-08 DIAGNOSIS — Z130 Encounter for screening for diseases of the blood and blood-forming organs and certain disorders involving the immune mechanism: Secondary | ICD-10-CM | POA: Diagnosis not present

## 2017-02-08 DIAGNOSIS — Z0000 Encounter for general adult medical examination without abnormal findings: Secondary | ICD-10-CM | POA: Diagnosis not present

## 2017-02-08 LAB — CBC
HEMATOCRIT: 36 % (ref 34.0–46.6)
HEMOGLOBIN: 12.2 g/dL (ref 11.1–15.9)
MCH: 24.8 pg — AB (ref 26.6–33.0)
MCHC: 33.9 g/dL (ref 31.5–35.7)
MCV: 73 fL — AB (ref 79–97)
Platelets: 341 10*3/uL (ref 150–379)
RBC: 4.92 x10E6/uL (ref 3.77–5.28)
RDW: 16.1 % — ABNORMAL HIGH (ref 12.3–15.4)
WBC: 6.1 10*3/uL (ref 3.4–10.8)

## 2017-02-08 LAB — BASIC METABOLIC PANEL
BUN/Creatinine Ratio: 11 (ref 9–23)
BUN: 9 mg/dL (ref 6–24)
CHLORIDE: 104 mmol/L (ref 96–106)
CO2: 21 mmol/L (ref 20–29)
CREATININE: 0.84 mg/dL (ref 0.57–1.00)
Calcium: 9.5 mg/dL (ref 8.7–10.2)
GFR calc Af Amer: 94 mL/min/{1.73_m2} (ref >59)
GFR, EST NON AFRICAN AMERICAN: 81 mL/min/{1.73_m2} (ref >59)
GLUCOSE: 101 mg/dL — AB (ref 65–99)
POTASSIUM: 3.9 mmol/L (ref 3.5–5.2)
Sodium: 140 mmol/L (ref 134–144)

## 2017-02-08 LAB — POCT URINALYSIS DIP (MANUAL ENTRY)
BILIRUBIN UA: NEGATIVE mg/dL
Bilirubin, UA: NEGATIVE
Glucose, UA: NEGATIVE mg/dL
Leukocytes, UA: NEGATIVE
Nitrite, UA: NEGATIVE
PH UA: 6 (ref 5.0–8.0)
Protein Ur, POC: NEGATIVE mg/dL
SPEC GRAV UA: 1.015 (ref 1.010–1.025)
Urobilinogen, UA: 0.2 E.U./dL

## 2017-02-08 LAB — POC MICROSCOPIC URINALYSIS (UMFC): Mucus: ABSENT

## 2017-02-08 LAB — HEMOGLOBIN A1C
ESTIMATED AVERAGE GLUCOSE: 128 mg/dL
HEMOGLOBIN A1C: 6.1 % — AB (ref 4.8–5.6)

## 2017-02-08 LAB — POCT URINE PREGNANCY: Preg Test, Ur: NEGATIVE

## 2017-02-08 MED ORDER — AMLODIPINE BESYLATE 5 MG PO TABS: 5.0000 mg | ORAL_TABLET | Freq: Every day | ORAL | 0 refills | Status: DC

## 2017-02-08 MED ORDER — ALBUTEROL SULFATE HFA 108 (90 BASE) MCG/ACT IN AERS: INHALATION_SPRAY | RESPIRATORY_TRACT | 0 refills | Status: DC

## 2017-02-08 NOTE — Progress Notes (Signed)
02/08/2017 9:31 AM   DOB: 11/01/1965 / MRN: [id number]  SUBJECTIVE:  [name] is a 52 y.o. female presenting for annual exam. She has several antibiotic allergies. Multiple chronic diseases including Ashtma, Cushings, Diabetes, HTN and sleep apnea.  Former smoker quit four years ago. History of surgical sterilization.  Abnormal pap last year and due this year. She wears her CPAP nightly.   Immunization History  Administered Date(s) Administered  . Influenza Whole 12/13/2008  . Td 10/29/2000  . Tdap 09/21/2010       She is allergic to bactrim [sulfamethoxazole-trimethoprim]; doxycycline; and rocephin [ceftriaxone sodium].   She  has a past medical history of Allergy, Asthma, Cushing syndrome (HCC), Diabetes mellitus, Heart murmur, Hyperlipidemia, Hypertension, and Sleep apnea.    She  reports that she quit smoking about 4 years ago. Her smoking use included cigarettes. She smoked 0.25 packs per day. she has never used smokeless tobacco. She reports that she does not drink alcohol or use drugs. She  reports that she currently engages in sexual activity and has had partners who are Female. She reports using the following method of birth control/protection: Surgical. The patient  has a past surgical history that includes Tubal ligation (01/10/2000); Cervical conization w/bx (1999); Cholecystectomy (N/A, 01/31/2014); and Gallbladder surgery (Jan 31, 2014).  Her family history includes Asthma in her daughter; Cancer (age of onset: 25) in her cousin; Heart murmur in her daughter, daughter, and son; Hyperlipidemia in her mother; Hypertension in her father; Pulmonary embolism (age of onset: 25) in her daughter; Thyroid disease in her sister.  Review of Systems  Constitutional: Negative for chills, diaphoresis and fever.  Eyes: Negative.   Respiratory: Negative for cough, hemoptysis, sputum production, shortness of breath and wheezing.   Cardiovascular: Negative for chest pain, orthopnea and  leg swelling.  Gastrointestinal: Negative for abdominal pain, blood in stool, constipation, diarrhea, heartburn, melena, nausea and vomiting.  Genitourinary: Negative for dysuria, flank pain, frequency, hematuria and urgency.  Skin: Negative for rash.  Neurological: Negative for dizziness, sensory change, speech change, focal weakness and headaches.    The problem list and medications were reviewed and updated by myself where necessary and exist elsewhere in the encounter.   OBJECTIVE:  BP 132/70   Pulse 87   Temp 98.6 F (37 C)   Resp 16   Ht 5\' 4"  (1.626 m)   Wt 256 lb 12.8 oz (116.5 kg)   LMP 12/28/2016   SpO2 98%   BMI 44.08 kg/m   Physical Exam  Constitutional: She is oriented to person, place, and time. She is active.  Non-toxic appearance.  Eyes: EOM are normal. Pupils are equal, round, and reactive to light.  Cardiovascular: Normal rate, regular rhythm, S1 normal, S2 normal, normal heart sounds and intact distal pulses. Exam reveals no gallop, no friction rub and no decreased pulses.  No murmur heard. Pulmonary/Chest: Effort normal. No stridor. No tachypnea. No respiratory distress. She has no wheezes. She has no rales.  Abdominal: She exhibits no distension.  Musculoskeletal: She exhibits no edema.  Neurological: She is alert and oriented to person, place, and time. She has normal strength and normal reflexes. She is not disoriented. She displays no atrophy. No cranial nerve deficit or sensory deficit. She exhibits normal muscle tone. Coordination and gait normal.  Skin: Skin is warm and dry. She is not diaphoretic. No pallor.  Psychiatric: Her behavior is normal.    Lab Results  Component Value Date   WBC 8.9 05/24/2015  HGB 11.3 (L) 05/24/2015   HCT 34.7 (L) 05/24/2015   MCV 71.1 (L) 05/24/2015   PLT 371 05/24/2015    Lab Results  Component Value Date   CREATININE 0.88 02/01/2014   BUN 10 02/01/2014   NA 138 02/01/2014   K 3.4 (L) 02/01/2014   CL 106  02/01/2014   CO2 23 02/01/2014    Lab Results  Component Value Date   ALT 13 01/31/2014   AST 21 01/31/2014   ALKPHOS 72 01/31/2014   BILITOT 0.3 01/31/2014    Lab Results  Component Value Date   TSH 1.65 05/24/2015    Lab Results  Component Value Date   HGBA1C 6.2 (H) 01/31/2014    Lab Results  Component Value Date   CHOL 261 (H) 10/22/2011   HDL 47 10/22/2011   LDLCALC 172 (H) 10/22/2011   TRIG 209 (H) 10/22/2011   CHOLHDL 5.6 10/22/2011   Wt Readings from Last 3 Encounters:  02/08/17 256 lb 12.8 oz (116.5 kg)  04/19/16 238 lb (108 kg)  01/02/16 237 lb (107.5 kg)     Results for orders placed or performed in visit on 02/08/17 (from the past 72 hour(s))  POCT urinalysis dipstick     Status: Abnormal   Collection Time: 02/08/17  9:10 AM  Result Value Ref Range   Color, UA yellow yellow   Clarity, UA clear clear   Glucose, UA negative negative mg/dL   Bilirubin, UA negative negative   Ketones, POC UA negative negative mg/dL   Spec Grav, UA 1.6101.015 9.6041.010 - 1.025   Blood, UA small (A) negative   pH, UA 6.0 5.0 - 8.0   Protein Ur, POC negative negative mg/dL   Urobilinogen, UA 0.2 0.2 or 1.0 E.U./dL   Nitrite, UA Negative Negative   Leukocytes, UA Negative Negative  POCT urine pregnancy     Status: None   Collection Time: 02/08/17  9:10 AM  Result Value Ref Range   Preg Test, Ur Negative Negative    No results found.  ASSESSMENT AND PLAN:  [name] was seen today for annual exam.  Diagnoses and all orders for this visit:  Annual physical exam -     Care order/instruction:  Screening for cervical cancer -     Pap IG, CT/NG NAA, and HPV (high risk) [facility]  Special screening for malignant neoplasms, colon -     Cologuard  Screening for endocrine, metabolic, and immunity disorder -     CBC -     Basic Metabolic Panel -     Hemoglobin A1c -     POCT urinalysis dipstick -     POCT urine pregnancy -     POCT Microscopic Urinalysis  ([facility])    The patient is advised to call or return to clinic if she does not see an improvement in symptoms, or to seek the care of the closest emergency department if she worsens with the above plan.   Deliah BostonMichael Clark, MHS, PA-C [facility] 02/08/2017 9:31 AM

## 2017-02-08 NOTE — Patient Instructions (Addendum)
     IF you received an x-ray today, you will receive an invoice from [facility]. Please contact [facility] at [phone] with questions or concerns regarding your invoice.   IF you received labwork today, you will receive an invoice from [facility]. Please contact [facility] at [phone] with questions or concerns regarding your invoice.   Our billing staff will not be able to assist you with questions regarding bills from these companies.  You will be contacted with the lab results as soon as they are available. The fastest way to get your results is to activate your My Chart account. Instructions are located on the last page of this paperwork. If you have not heard from us regarding the results in 2 weeks, please contact this office.     

## 2017-02-11 DIAGNOSIS — R718 Other abnormality of red blood cells: Secondary | ICD-10-CM | POA: Diagnosis not present

## 2017-02-11 NOTE — Addendum Note (Signed)
Addended by: Baldwin CrownJOHNSON, SHAQUETTA D on: 02/11/2017 08:44 AM   Modules accepted: Orders

## 2017-02-12 LAB — PAP IG, CT-NG NAA, HPV HIGH-RISK
CHLAMYDIA, NUC. ACID AMP: NEGATIVE
Gonococcus by Nucleic Acid Amp: NEGATIVE
HPV, high-risk: NEGATIVE
PAP SMEAR COMMENT: 0

## 2017-02-12 LAB — IRON,TIBC AND FERRITIN PANEL
Ferritin: 21 ng/mL (ref 15–150)
IRON: 71 ug/dL (ref 27–159)
Iron Saturation: 23 % (ref 15–55)
Total Iron Binding Capacity: 309 ug/dL (ref 250–450)
UIBC: 238 ug/dL (ref 131–425)

## 2017-03-02 ENCOUNTER — Other Ambulatory Visit: Payer: Self-pay | Admitting: Physician Assistant

## 2017-03-07 ENCOUNTER — Other Ambulatory Visit: Payer: Self-pay | Admitting: Physician Assistant

## 2017-03-20 ENCOUNTER — Other Ambulatory Visit: Payer: Self-pay

## 2017-03-20 ENCOUNTER — Encounter (HOSPITAL_BASED_OUTPATIENT_CLINIC_OR_DEPARTMENT_OTHER): Payer: Self-pay | Admitting: Emergency Medicine

## 2017-03-20 ENCOUNTER — Emergency Department (HOSPITAL_BASED_OUTPATIENT_CLINIC_OR_DEPARTMENT_OTHER): Payer: Federal, State, Local not specified - PPO

## 2017-03-20 ENCOUNTER — Emergency Department (HOSPITAL_BASED_OUTPATIENT_CLINIC_OR_DEPARTMENT_OTHER)
Admission: EM | Admit: 2017-03-20 | Discharge: 2017-03-20 | Disposition: A | Discharge status: Home / Self Care | Payer: Federal, State, Local not specified - PPO | Attending: Emergency Medicine | Admitting: Emergency Medicine

## 2017-03-20 DIAGNOSIS — E119 Type 2 diabetes mellitus without complications: Secondary | ICD-10-CM | POA: Insufficient documentation

## 2017-03-20 DIAGNOSIS — J111 Influenza due to unidentified influenza virus with other respiratory manifestations: Secondary | ICD-10-CM

## 2017-03-20 DIAGNOSIS — R079 Chest pain, unspecified: Secondary | ICD-10-CM | POA: Diagnosis not present

## 2017-03-20 DIAGNOSIS — R05 Cough: Secondary | ICD-10-CM | POA: Diagnosis not present

## 2017-03-20 DIAGNOSIS — Z79899 Other long term (current) drug therapy: Secondary | ICD-10-CM | POA: Diagnosis not present

## 2017-03-20 DIAGNOSIS — Z87891 Personal history of nicotine dependence: Secondary | ICD-10-CM | POA: Diagnosis not present

## 2017-03-20 DIAGNOSIS — R0981 Nasal congestion: Secondary | ICD-10-CM | POA: Insufficient documentation

## 2017-03-20 DIAGNOSIS — R69 Illness, unspecified: Secondary | ICD-10-CM

## 2017-03-20 DIAGNOSIS — I10 Essential (primary) hypertension: Secondary | ICD-10-CM | POA: Diagnosis not present

## 2017-03-20 DIAGNOSIS — J45909 Unspecified asthma, uncomplicated: Secondary | ICD-10-CM | POA: Insufficient documentation

## 2017-03-20 DIAGNOSIS — R509 Fever, unspecified: Secondary | ICD-10-CM | POA: Diagnosis not present

## 2017-03-20 DIAGNOSIS — M791 Myalgia, unspecified site: Secondary | ICD-10-CM | POA: Diagnosis not present

## 2017-03-20 DIAGNOSIS — R110 Nausea: Secondary | ICD-10-CM | POA: Diagnosis not present

## 2017-03-20 LAB — PREGNANCY, URINE: PREG TEST UR: NEGATIVE

## 2017-03-20 MED ORDER — ALBUTEROL SULFATE HFA 108 (90 BASE) MCG/ACT IN AERS: 1.0000 | INHALATION_SPRAY | Freq: Four times a day (QID) | RESPIRATORY_TRACT | 0 refills | Status: DC | PRN

## 2017-03-20 MED ORDER — BENZONATATE 100 MG PO CAPS: 100.0000 mg | ORAL_CAPSULE | Freq: Three times a day (TID) | ORAL | 0 refills | Status: DC

## 2017-03-20 MED ORDER — IBUPROFEN 800 MG PO TABS
800.0000 mg | ORAL_TABLET | Freq: Once | ORAL | Status: AC
  Administered 2017-03-20: 800 mg via ORAL
  Filled 2017-03-20: qty 1

## 2017-03-20 MED ORDER — ONDANSETRON 8 MG PO TBDP
8.0000 mg | ORAL_TABLET | Freq: Once | ORAL | Status: AC
  Administered 2017-03-20: 8 mg via ORAL
  Filled 2017-03-20: qty 1

## 2017-03-20 MED ORDER — ACETAMINOPHEN 500 MG PO TABS
1000.0000 mg | ORAL_TABLET | Freq: Once | ORAL | Status: AC
  Administered 2017-03-20: 1000 mg via ORAL
  Filled 2017-03-20: qty 2

## 2017-03-20 NOTE — ED Triage Notes (Signed)
Pt c/o posterior rib pain with fever, chills, cough and HA.

## 2017-03-20 NOTE — ED Notes (Signed)
Pt unable to provide urine sample at this time 

## 2017-03-20 NOTE — ED Provider Notes (Signed)
[facility] EMERGENCY DEPARTMENT Provider Note   CSN: [id number] Arrival date & time: 03/20/17  0331     History   Chief Complaint Chief Complaint  Patient presents with  . Fever    HPI [name] is a 52 y.o. female.  The history is provided by the patient. No language interpreter was used.  Fever   This is a new problem. The current episode started yesterday. The problem occurs constantly. The problem has not changed since onset.The maximum temperature noted was 102 to 102.9 F. Associated symptoms include congestion, muscle aches and cough. Pertinent negatives include no chest pain, no fussiness, no sleepiness, no diarrhea and no vomiting. She has tried nothing for the symptoms. The treatment provided no relief.    Past Medical History:  Diagnosis Date  . Allergy   . Asthma   . Cushing syndrome (HCC)    ?-pt states she was given this diagnosis during a hospitalization.  Records reviewed, no mention of dx or treatment  . Diabetes mellitus   . Heart murmur    mild TR  . Hyperlipidemia   . Hypertension   . Sleep apnea     Patient Active Problem List   Diagnosis Date Noted  . Asthma exacerbation, mild 01/31/2014  . Snoring 05/18/2013  . Accelerated hypertension 05/18/2013  . Essential hypertension, benign 06/05/2011  . Diabetes mellitus without mention of complication 06/05/2011  . Asthma 07/21/2010  . Heart murmur 07/21/2010  . Hyperlipidemia 07/21/2010  . Rhinitis, allergic 07/21/2010    Past Surgical History:  Procedure Laterality Date  . CERVICAL CONIZATION W/BX  1999  . CHOLECYSTECTOMY N/A 01/31/2014   Procedure: LAPAROSCOPIC CHOLECYSTECTOMY ;  Surgeon: Abigail Miyamoto, MD;  Location: [facility] OR;  Service: General;  Laterality: N/A;  . GALLBLADDER SURGERY  Jan 31, 2014  . TUBAL LIGATION  01/10/2000    OB History    Gravida Para Term Preterm AB Living   5 3     2 3    SAB TAB Ectopic Multiple Live Births   1 1             Home  Medications    Prior to Admission medications   Medication Sig Start Date End Date Taking? Authorizing Provider  albuterol (PROAIR HFA) 108 (90 Base) MCG/ACT inhaler INHALE 2 PUFFS INTO THE LUNGS EVERY 4 (FOUR) HOURS AS NEEDED FOR WHEEZING OR SHORTNESS OF BREATH. 02/08/17   Ofilia Neas, PA-C  amLODipine (NORVASC) 5 MG tablet TAKE 1 TABLET BY MOUTH EVERY DAY 03/07/17   Ofilia Neas, PA-C  fluticasone (FLOVENT HFA) 44 MCG/ACT inhaler Inhale 2 puffs into the lungs 2 (two) times daily. 04/19/16   Garnetta Buddy, PA  medroxyPROGESTERone (PROVERA) 5 MG tablet Take 1 tab a day x 5 days every other month if no spontaneous menses Patient not taking: Reported on 02/08/2017 05/24/15   Romualdo Bolk, MD    Family History Family History  Problem Relation Age of Onset  . Thyroid disease Sister   . Hyperlipidemia Mother   . Heart murmur Daughter   . Heart murmur Son   . Heart murmur Daughter   . Asthma Daughter   . Hypertension Father   . Cancer Cousin 19       female cousin--unknown primary, metastatic  . Pulmonary embolism Daughter 79  . Diabetes Neg Hx     Social History Social History   Tobacco Use  . Smoking status: Former Smoker    Packs/day: 0.25  Types: Cigarettes    Last attempt to quit: 02/21/2012    Years since quitting: 5.0  . Smokeless tobacco: Never Used  Substance Use Topics  . Alcohol use: No    Alcohol/week: 0.0 oz  . Drug use: No     Allergies   Bactrim [sulfamethoxazole-trimethoprim]; Doxycycline; and Rocephin [ceftriaxone sodium]   Review of Systems Review of Systems  Constitutional: Positive for fever.  HENT: Positive for congestion. Negative for facial swelling, trouble swallowing and voice change.   Eyes: Negative for photophobia.  Respiratory: Positive for cough. Negative for shortness of breath.   Cardiovascular: Negative for chest pain.  Gastrointestinal: Positive for nausea. Negative for abdominal pain, diarrhea and vomiting.    Musculoskeletal: Negative for neck pain and neck stiffness.  All other systems reviewed and are negative.    Physical Exam Updated Vital Signs BP (!) 158/107 (BP Location: Right Arm)   Pulse (!) 127   Temp 100.3 F (37.9 C) (Oral)   Resp 18   Ht 5\' 4"  (1.626 m)   Wt 113.4 kg (250 lb)   SpO2 98%   BMI 42.91 kg/m   Physical Exam  Constitutional: She is oriented to person, place, and time. She appears well-developed and well-nourished. No distress.  HENT:  Head: Normocephalic and atraumatic.  Nose: Nose normal.  Mouth/Throat: No oropharyngeal exudate.  Eyes: Conjunctivae are normal. Pupils are equal, round, and reactive to light.  Neck: Normal range of motion. Neck supple.  Cardiovascular: Normal rate, regular rhythm, normal heart sounds and intact distal pulses.  Pulmonary/Chest: Effort normal and breath sounds normal. No stridor. She has no wheezes. She has no rales.  Abdominal: Soft. Bowel sounds are normal. She exhibits no mass. There is no tenderness. There is no rebound and no guarding.  Musculoskeletal: Normal range of motion.  Neurological: She is alert and oriented to person, place, and time. She displays normal reflexes. Sensory deficit:   Skin: Skin is warm and dry. Capillary refill takes less than 2 seconds.  Psychiatric: She has a normal mood and affect.  Nursing note and vitals reviewed.    ED Treatments / Results  Labs (all labs ordered are listed, but only abnormal results are displayed)  Results for orders placed or performed in visit on 02/08/17  CBC  Result Value Ref Range   WBC 6.1 3.4 - 10.8 x10E3/uL   RBC 4.92 3.77 - 5.28 x10E6/uL   Hemoglobin 12.2 11.1 - 15.9 g/dL   Hematocrit 82.936.0 56.234.0 - 46.6 %   MCV 73 (L) 79 - 97 fL   MCH 24.8 (L) 26.6 - 33.0 pg   MCHC 33.9 31.5 - 35.7 g/dL   RDW 13.016.1 (H) 86.512.3 - 78.415.4 %   Platelets 341 150 - 379 x10E3/uL  Basic Metabolic Panel  Result Value Ref Range   Glucose 101 (H) 65 - 99 mg/dL   BUN 9 6 - 24 mg/dL    Creatinine, Ser 6.960.84 0.57 - 1.00 mg/dL   GFR calc non Af Amer 81 >59 mL/min/1.73   GFR calc Af Amer 94 >59 mL/min/1.73   BUN/Creatinine Ratio 11 9 - 23   Sodium 140 134 - 144 mmol/L   Potassium 3.9 3.5 - 5.2 mmol/L   Chloride 104 96 - 106 mmol/L   CO2 21 20 - 29 mmol/L   Calcium 9.5 8.7 - 10.2 mg/dL  Hemoglobin E9BA1c  Result Value Ref Range   Hgb A1c MFr Bld 6.1 (H) 4.8 - 5.6 %   Est. average glucose Bld  gHb Est-mCnc 128 mg/dL  Iron, TIBC and Ferritin Panel  Result Value Ref Range   Total Iron Binding Capacity 309 250 - 450 ug/dL   UIBC 161 096 - 045 ug/dL   Iron 71 27 - 409 ug/dL   Iron Saturation 23 15 - 55 %   Ferritin 21 15 - 150 ng/mL  POCT urinalysis dipstick  Result Value Ref Range   Color, UA yellow yellow   Clarity, UA clear clear   Glucose, UA negative negative mg/dL   Bilirubin, UA negative negative   Ketones, POC UA negative negative mg/dL   Spec Grav, UA 8.119 1.478 - 1.025   Blood, UA small (A) negative   pH, UA 6.0 5.0 - 8.0   Protein Ur, POC negative negative mg/dL   Urobilinogen, UA 0.2 0.2 or 1.0 E.U./dL   Nitrite, UA Negative Negative   Leukocytes, UA Negative Negative  POCT urine pregnancy  Result Value Ref Range   Preg Test, Ur Negative Negative  POCT Microscopic Urinalysis ([facility])  Result Value Ref Range   WBC,UR,HPF,POC Few (A) None WBC/hpf   RBC,UR,HPF,POC Few (A) None RBC/hpf   Bacteria Few (A) None, Too numerous to count   Mucus Absent Absent   Epithelial Cells, UR Per Microscopy Few (A) None, Too numerous to count cells/hpf  Pap IG, CT/NG NAA, and HPV (high risk) [facility]  Result Value Ref Range   DIAGNOSIS: Comment    Specimen adequacy: Comment    Clinician Provided ICD10 Comment    Performed by: Comment    PAP Smear Comment .    Note: Comment    Test Methodology Comment    HPV, high-risk Negative Negative   Chlamydia, Nuc. Acid Amp Negative Negative   Gonococcus by Nucleic Acid Amp Negative Negative   No results  found.  Radiology No results found.  Procedures Procedures (including critical care time)  Medications Ordered in ED Medications  ibuprofen (ADVIL,MOTRIN) tablet 800 mg (not administered)  ondansetron (ZOFRAN-ODT) disintegrating tablet 8 mg (not administered)  acetaminophen (TYLENOL) tablet 1,000 mg (1,000 mg Oral Given 03/20/17 0347)      Final Clinical Impressions(s) / ED Diagnoses   Return for weakness, numbness, changes in vision or speech,  fevers > 100.4 unrelieved by medication, shortness of breath, intractable vomiting, or diarrhea, abdominal pain, Inability to tolerate liquids or food, cough, altered mental status or any concerns. No signs of systemic illness or infection. The patient is nontoxic-appearing on exam and vital signs are within normal limits.    I have reviewed the triage vital signs and the nursing notes. Pertinent labs &imaging results that were available during my care of the patient were reviewed by me and considered in my medical decision making (see chart for details).  After history, exam, and medical workup I feel the patient has been appropriately medically screened and is safe for discharge home. Pertinent diagnoses were discussed with the patient. Patient was given return precautions.   Lance Galas, MD 03/20/17 [phone]

## 2017-03-25 ENCOUNTER — Ambulatory Visit: Payer: Federal, State, Local not specified - PPO | Admitting: Physician Assistant

## 2017-03-25 VITALS — BP 145/90 | HR 86 | Temp 99.0°F | Resp 16 | Ht 64.0 in | Wt 250.0 lb

## 2017-03-25 DIAGNOSIS — R062 Wheezing: Secondary | ICD-10-CM | POA: Diagnosis not present

## 2017-03-25 DIAGNOSIS — J22 Unspecified acute lower respiratory infection: Secondary | ICD-10-CM

## 2017-03-25 DIAGNOSIS — R059 Cough, unspecified: Secondary | ICD-10-CM

## 2017-03-25 DIAGNOSIS — R05 Cough: Secondary | ICD-10-CM | POA: Diagnosis not present

## 2017-03-25 LAB — POCT CBC
Granulocyte percent: 57.8 %G (ref 37–80)
HCT, POC: 37.9 % (ref 37.7–47.9)
HEMOGLOBIN: 12.1 g/dL — AB (ref 12.2–16.2)
LYMPH, POC: 2.7 (ref 0.6–3.4)
MCH, POC: 23.5 pg — AB (ref 27–31.2)
MCHC: 31.8 g/dL (ref 31.8–35.4)
MCV: 73.8 fL — AB (ref 80–97)
MID (cbc): 0.5 (ref 0–0.9)
MPV: 7.5 fL (ref 0–99.8)
POC Granulocyte: 4.3 (ref 2–6.9)
POC LYMPH PERCENT: 35.8 %L (ref 10–50)
POC MID %: 6.4 %M (ref 0–12)
Platelet Count, POC: 286 10*3/uL (ref 142–424)
RBC: 5.13 M/uL (ref 4.04–5.48)
RDW, POC: 14.2 %
WBC: 7.5 10*3/uL (ref 4.6–10.2)

## 2017-03-25 MED ORDER — IPRATROPIUM BROMIDE 0.02 % IN SOLN
0.5000 mg | Freq: Once | RESPIRATORY_TRACT | Status: AC
  Administered 2017-03-25: 0.5 mg via RESPIRATORY_TRACT

## 2017-03-25 MED ORDER — AZITHROMYCIN 250 MG PO TABS: ORAL_TABLET | ORAL | 0 refills | Status: DC

## 2017-03-25 MED ORDER — ALBUTEROL SULFATE (2.5 MG/3ML) 0.083% IN NEBU
2.5000 mg | INHALATION_SOLUTION | Freq: Once | RESPIRATORY_TRACT | Status: AC
  Administered 2017-03-25: 2.5 mg via RESPIRATORY_TRACT

## 2017-03-25 MED ORDER — PREDNISONE 20 MG PO TABS: ORAL_TABLET | ORAL | 0 refills | Status: DC

## 2017-03-25 MED ORDER — HYDROCOD POLST-CPM POLST ER 10-8 MG/5ML PO SUER: 5.0000 mL | Freq: Every evening | ORAL | 0 refills | Status: DC | PRN

## 2017-03-25 NOTE — Patient Instructions (Addendum)
Start the prednisone tomorrow morning. Please take your albuterol every 6 hours for the next 24 hours routinely.  \ Please take medication as prescribed. Make sure you are hydrating with water, 64 oz or more.    Acute Bronchitis, Adult Acute bronchitis is when air tubes (bronchi) in the lungs suddenly get swollen. The condition can make it hard to breathe. It can also cause these symptoms:  A cough.  Coughing up clear, yellow, or green mucus.  Wheezing.  Chest congestion.  Shortness of breath.  A fever.  Body aches.  Chills.  A sore throat.  Follow these instructions at home: Medicines  Take over-the-counter and prescription medicines only as told by your doctor.  If you were prescribed an antibiotic medicine, take it as told by your doctor. Do not stop taking the antibiotic even if you start to feel better. General instructions  Rest.  Drink enough fluids to keep your pee (urine) clear or pale yellow.  Avoid smoking and secondhand smoke. If you smoke and you need help quitting, ask your doctor. Quitting will help your lungs heal faster.  Use an inhaler, cool mist vaporizer, or humidifier as told by your doctor.  Keep all follow-up visits as told by your doctor. This is important. How is this prevented? To lower your risk of getting this condition again:  Wash your hands often with soap and water. If you cannot use soap and water, use hand sanitizer.  Avoid contact with people who have cold symptoms.  Try not to touch your hands to your mouth, nose, or eyes.  Make sure to get the flu shot every year.  Contact a doctor if:  Your symptoms do not get better in 2 weeks. Get help right away if:  You cough up blood.  You have chest pain.  You have very bad shortness of breath.  You become dehydrated.  You faint (pass out) or keep feeling like you are going to pass out.  You keep throwing up (vomiting).  You have a very bad headache.  Your fever or  chills gets worse. This information is not intended to replace advice given to you by your health care provider. Make sure you discuss any questions you have with your health care provider. Document Released: 07/11/2007 Document Revised: 08/31/2015 Document Reviewed: 07/13/2015 Elsevier Interactive Patient Education  2018 [employer]   IF you received an x-ray today, you will receive an invoice from [facility]. Please contact [facility] at [phone] with questions or concerns regarding your invoice.   IF you received labwork today, you will receive an invoice from [facility]. Please contact [facility] at [phone] with questions or concerns regarding your invoice.   Our billing staff will not be able to assist you with questions regarding bills from these companies.  You will be contacted with the lab results as soon as they are available. The fastest way to get your results is to activate your My Chart account. Instructions are located on the last page of this paperwork. If you have not heard from us regarding the results in 2 weeks, please contact this office.

## 2017-03-25 NOTE — Progress Notes (Signed)
PRIMARY CARE AT [facility] [address] [place] [address] [phone]  Date:  03/25/2017   Name:  [name]   DOB:  03/02/1965   MRN:  [id number]  PCP:  Ronnald Nian, MD    History of Present Illness:  [name] is a 52 y.o. female patient who presents to PCP with  Chief Complaint  Patient presents with  . Cough    chest congestion, x 2wks  . Sinusitis    x 2 wk     2 weeks of coughing.  Productive cough.   Nasal congestion.  Minimal runny nose.   Some windedness with exertion.   No fever at this time.    Patient Active Problem List   Diagnosis Date Noted  . Asthma exacerbation, mild 01/31/2014  . Snoring 05/18/2013  . Accelerated hypertension 05/18/2013  . Essential hypertension, benign 06/05/2011  . Diabetes mellitus without mention of complication 06/05/2011  . Asthma 07/21/2010  . Heart murmur 07/21/2010  . Hyperlipidemia 07/21/2010  . Rhinitis, allergic 07/21/2010    Past Medical History:  Diagnosis Date  . Allergy   . Asthma   . Cushing syndrome (HCC)    ?-pt states she was given this diagnosis during a hospitalization.  Records reviewed, no mention of dx or treatment  . Diabetes mellitus   . Heart murmur    mild TR  . Hyperlipidemia   . Hypertension   . Sleep apnea     Past Surgical History:  Procedure Laterality Date  . CERVICAL CONIZATION W/BX  1999  . CHOLECYSTECTOMY N/A 01/31/2014   Procedure: LAPAROSCOPIC CHOLECYSTECTOMY ;  Surgeon: Abigail Miyamoto, MD;  Location: [facility] OR;  Service: General;  Laterality: N/A;  . GALLBLADDER SURGERY  Jan 31, 2014  . TUBAL LIGATION  01/10/2000    Social History   Tobacco Use  . Smoking status: Former Smoker    Packs/day: 0.25    Types: Cigarettes    Last attempt to quit: 02/21/2012    Years since quitting: 5.0  . Smokeless tobacco: Never Used  Substance Use Topics  . Alcohol use: No    Alcohol/week: 0.0 oz  . Drug use: No    Family History  Problem Relation Age of Onset  . Thyroid  disease Sister   . Hyperlipidemia Mother   . Heart murmur Daughter   . Heart murmur Son   . Heart murmur Daughter   . Asthma Daughter   . Hypertension Father   . Cancer Cousin 62       female cousin--unknown primary, metastatic  . Pulmonary embolism Daughter 77  . Diabetes Neg Hx     Allergies  Allergen Reactions  . Bactrim [Sulfamethoxazole-Trimethoprim]     Lip swelling, mouth itching  . Doxycycline     Severe headache  . Rocephin [Ceftriaxone Sodium] Hives, Itching and Other (See Comments)    wheezing    Medication list has been reviewed and updated.  Current Outpatient Medications on File Prior to Visit  Medication Sig Dispense Refill  . albuterol (PROAIR HFA) 108 (90 Base) MCG/ACT inhaler INHALE 2 PUFFS INTO THE LUNGS EVERY 4 (FOUR) HOURS AS NEEDED FOR WHEEZING OR SHORTNESS OF BREATH. 8.5 Inhaler 0  . albuterol (PROVENTIL HFA;VENTOLIN HFA) 108 (90 Base) MCG/ACT inhaler Inhale 1-2 puffs into the lungs every 6 (six) hours as needed for wheezing or shortness of breath. 1 Inhaler 0  . amLODipine (NORVASC) 5 MG tablet TAKE 1 TABLET BY MOUTH EVERY DAY 30 tablet 0  . benzonatate (  TESSALON) 100 MG capsule Take 1 capsule (100 mg total) by mouth every 8 (eight) hours. 21 capsule 0  . fluticasone (FLOVENT HFA) 44 MCG/ACT inhaler Inhale 2 puffs into the lungs 2 (two) times daily. 1 Inhaler 12  . medroxyPROGESTERone (PROVERA) 5 MG tablet Take 1 tab a day x 5 days every other month if no spontaneous menses (Patient not taking: Reported on 02/08/2017) 15 tablet 1   No current facility-administered medications on file prior to visit.     ROS ROS otherwise unremarkable unless listed above.  Physical Examination: BP (!) 145/90   Pulse 86   Temp 99 F (37.2 C) (Oral)   Resp 16   Ht 5\' 4"  (1.626 m)   Wt 250 lb (113.4 kg)   LMP 12/28/2016   SpO2 98%   BMI 42.91 kg/m  Ideal Body Weight: Weight in (lb) to have BMI = 25: 145.3  Physical Exam  Constitutional: She is oriented to  person, place, and time. She appears well-developed and well-nourished. No distress.  HENT:  Head: Normocephalic and atraumatic.  Right Ear: Tympanic membrane, external ear and ear canal normal.  Left Ear: Tympanic membrane, external ear and ear canal normal.  Nose: Mucosal edema and rhinorrhea present. Right sinus exhibits no maxillary sinus tenderness and no frontal sinus tenderness. Left sinus exhibits no maxillary sinus tenderness and no frontal sinus tenderness.  Mouth/Throat: No uvula swelling. No oropharyngeal exudate, posterior oropharyngeal edema or posterior oropharyngeal erythema.  Eyes: Conjunctivae and EOM are normal. Pupils are equal, round, and reactive to light.  Cardiovascular: Normal rate and regular rhythm. Exam reveals no gallop, no distant heart sounds and no friction rub.  No murmur heard. Pulmonary/Chest: Effort normal. No respiratory distress. She has no decreased breath sounds. She has wheezes. She has rhonchi.  Lymphadenopathy:       Head (right side): No submandibular, no tonsillar, no preauricular and no posterior auricular adenopathy present.       Head (left side): No submandibular, no tonsillar, no preauricular and no posterior auricular adenopathy present.  Neurological: She is alert and oriented to person, place, and time.  Skin: She is not diaphoretic.  Psychiatric: She has a normal mood and affect. Her behavior is normal.    Dg Chest 2 View  Result Date: 03/20/2017 CLINICAL DATA:  Fever.  Flu-like illness.  Chest pain. EXAM: CHEST  2 VIEW COMPARISON:  Radiograph 01/30/2014 FINDINGS: Bronchial thickening with slight progression from prior exam. The cardiomediastinal contours are normal. Pulmonary vasculature is normal. No consolidation, pleural effusion, or pneumothorax. No acute osseous abnormalities are seen. IMPRESSION: Increased bronchial thickening from prior exam, can be seen with acute bronchitis or asthma exacerbation. No focal airspace disease.  Electronically Signed   By: Rubye OaksMelanie  Ehinger M.D.   On: 03/20/2017 04:25    Assessment and Plan: [name] is a 52 y.o. female who is here today for cc of  Chief Complaint  Patient presents with  . Cough    chest congestion, x 2wks  . Sinusitis    x 2 wk  will treat at this time.  Advised albuterol q6 hours for the next 24 hours.   Advised hydration.   prednisone taper instructed to start next day Will treat for bacterial etiology Lower respiratory infection (e.g., bronchitis, pneumonia, pneumonitis, pulmonitis) - Plan: azithromycin (ZITHROMAX) 250 MG tablet, predniSONE (DELTASONE) 20 MG tablet, chlorpheniramine-HYDROcodone (TUSSIONEX PENNKINETIC ER) 10-8 MG/5ML SUER  Wheezing - Plan: POCT CBC, albuterol (PROVENTIL) (2.5 MG/3ML) 0.083% nebulizer solution 2.5 mg, ipratropium (  ATROVENT) nebulizer solution 0.5 mg  Cough - Plan: POCT CBC, chlorpheniramine-HYDROcodone (TUSSIONEX PENNKINETIC ER) 10-8 MG/5ML SUER  Trena Platt, PA-C [facility] 2/26/20193:44 PM

## 2017-03-27 ENCOUNTER — Encounter: Payer: Self-pay | Admitting: Physician Assistant

## 2017-03-29 DIAGNOSIS — Z1231 Encounter for screening mammogram for malignant neoplasm of breast: Secondary | ICD-10-CM | POA: Diagnosis not present

## 2017-04-02 ENCOUNTER — Encounter: Payer: Self-pay | Admitting: Physician Assistant

## 2017-04-13 ENCOUNTER — Other Ambulatory Visit: Payer: Self-pay | Admitting: Physician Assistant

## 2017-04-15 NOTE — Telephone Encounter (Signed)
LOV 02/08/17 with Deliah BostonMichael Clark / Refill request for Amlodipine / Refill per protocol /

## 2017-04-21 ENCOUNTER — Other Ambulatory Visit: Payer: Self-pay | Admitting: Physician Assistant

## 2017-04-21 DIAGNOSIS — J0100 Acute maxillary sinusitis, unspecified: Secondary | ICD-10-CM

## 2017-05-07 ENCOUNTER — Ambulatory Visit: Payer: Federal, State, Local not specified - PPO | Admitting: Urgent Care

## 2017-05-07 ENCOUNTER — Encounter: Payer: Self-pay | Admitting: Family Medicine

## 2017-05-07 ENCOUNTER — Ambulatory Visit (INDEPENDENT_AMBULATORY_CARE_PROVIDER_SITE_OTHER): Payer: Federal, State, Local not specified - PPO

## 2017-05-07 ENCOUNTER — Encounter: Payer: Self-pay | Admitting: Physician Assistant

## 2017-05-07 VITALS — BP 146/91 | HR 89 | Temp 97.9°F | Resp 17 | Ht 64.0 in | Wt 257.0 lb

## 2017-05-07 DIAGNOSIS — R05 Cough: Secondary | ICD-10-CM

## 2017-05-07 DIAGNOSIS — J4530 Mild persistent asthma, uncomplicated: Secondary | ICD-10-CM

## 2017-05-07 DIAGNOSIS — R059 Cough, unspecified: Secondary | ICD-10-CM

## 2017-05-07 DIAGNOSIS — R062 Wheezing: Secondary | ICD-10-CM

## 2017-05-07 DIAGNOSIS — J3089 Other allergic rhinitis: Secondary | ICD-10-CM | POA: Diagnosis not present

## 2017-05-07 DIAGNOSIS — R0602 Shortness of breath: Secondary | ICD-10-CM | POA: Diagnosis not present

## 2017-05-07 MED ORDER — CETIRIZINE HCL 10 MG PO TABS: 10.0000 mg | ORAL_TABLET | Freq: Every day | ORAL | 3 refills | Status: DC

## 2017-05-07 MED ORDER — ALBUTEROL SULFATE HFA 108 (90 BASE) MCG/ACT IN AERS: 1.0000 | INHALATION_SPRAY | Freq: Four times a day (QID) | RESPIRATORY_TRACT | 5 refills | Status: DC | PRN

## 2017-05-07 MED ORDER — METHYLPREDNISOLONE ACETATE 80 MG/ML IJ SUSP
80.0000 mg | Freq: Once | INTRAMUSCULAR | Status: AC
  Administered 2017-05-07: 80 mg via INTRAMUSCULAR

## 2017-05-07 NOTE — Progress Notes (Signed)
    MRN: [id number] DOB: 01/26/1966  Subjective:   Jocelyn CluckConstance Grant is a 52 y.o. female presenting for 4 day history of shob, wheezing, chest tightness, facial itching, swelling. Admits having right mid back pain similar to her episode of bronchitis in 03/2017. Symptoms started after she spent Saturday outdoors, thought it was related to pollen. Denies fever, tongue swelling, throat closing, chest pain, n/v, abdominal pain, rashes. Has been using her Flovent inhaler consistently and albuterol as needed. Denies smoking cigarettes. Has not started her Zyrtec.   [name] has a current medication list which includes the following prescription(s): albuterol, albuterol, amlodipine, and flovent hfa. Also is allergic to bactrim [sulfamethoxazole-trimethoprim]; doxycycline; and rocephin [ceftriaxone sodium].  [name]  has a past medical history of Allergy, Asthma, Cushing syndrome (HCC), Diabetes mellitus, Heart murmur, Hyperlipidemia, Hypertension, and Sleep apnea. Also  has a past surgical history that includes Tubal ligation (01/10/2000); Cervical conization w/bx (1999); Cholecystectomy (N/A, 01/31/2014); and Gallbladder surgery (Jan 31, 2014).  Objective:   Vitals: BP (!) 146/91   Pulse 89   Temp 97.9 F (36.6 C) (Oral)   Resp 17   Ht 5\' 4"  (1.626 m)   Wt 257 lb (116.6 kg)   SpO2 98%   BMI 44.11 kg/m   Physical Exam  Constitutional: She is oriented to person, place, and time. She appears well-developed and well-nourished.  HENT:  Head:    Mouth/Throat: Oropharynx is clear and moist.  Eyes: Right eye exhibits no discharge. Left eye exhibits no discharge.  Cardiovascular: Normal rate, regular rhythm and intact distal pulses. Exam reveals no gallop and no friction rub.  No murmur heard. Pulmonary/Chest: No stridor. No respiratory distress. She has wheezes. She has no rales.  Neurological: She is alert and oriented to person, place, and time.  Skin: Skin is warm and dry.   Dg Chest 2  View  Result Date: 05/07/2017 CLINICAL DATA:  Shortness of breath and cough for several days EXAM: CHEST - 2 VIEW COMPARISON:  None. FINDINGS: The heart size and mediastinal contours are within normal limits. Both lungs are clear. The visualized skeletal structures are unremarkable. IMPRESSION: No active cardiopulmonary disease. Electronically Signed   By: Alcide CleverMark  Lukens M.D.   On: 05/07/2017 18:22     Assessment and Plan :   Cough - Plan: DG Chest 2 View, methylPREDNISolone acetate (DEPO-MEDROL) injection 80 mg  Wheezing - Plan: DG Chest 2 View, methylPREDNISolone acetate (DEPO-MEDROL) injection 80 mg  Mild persistent asthma without complication - Plan: methylPREDNISolone acetate (DEPO-MEDROL) injection 80 mg  Allergic rhinitis due to other allergic trigger, unspecified seasonality - Plan: methylPREDNISolone acetate (DEPO-MEDROL) injection 80 mg  IM Depomedrol in clinic. Schedule albuterol inhaler, refills provided. Restart Zyrtec. Return-to-clinic precautions discussed, patient verbalized understanding.   Jocelyn BambergMario Louretta Tantillo, PA-C Primary Care at [facility] [phone] 05/07/2017  5:56 PM

## 2017-05-07 NOTE — Patient Instructions (Addendum)
Bronchospasm, Adult Bronchospasm is a tightening of the airways going into the lungs. During an episode, it may be harder to breathe. You may cough, and you may make a whistling sound when you breathe (wheeze). This condition often affects people with asthma. What are the causes? This condition is caused by swelling and irritation in the airways. It can be triggered by:  An infection (common).  Seasonal allergies.  An allergic reaction.  Exercise.  Irritants. These include pollution, cigarette smoke, strong odors, aerosol sprays, and paint fumes.  Weather changes. Winds increase molds and pollens in the air. Cold air may cause swelling.  Stress and emotional upset.  What are the signs or symptoms? Symptoms of this condition include:  Wheezing. If the episode was triggered by an allergy, wheezing may start right away or hours later.  Nighttime coughing.  Frequent or severe coughing with a simple cold.  Chest tightness.  Shortness of breath.  Decreased ability to exercise.  How is this diagnosed? This condition is usually diagnosed with a review of your medical history and a physical exam. Tests, such as lung function tests, are sometimes done to look for other conditions. The need for a chest X-ray depends on where the wheezing occurs and whether it is the first time you have wheezed. How is this treated? This condition may be treated with:  Inhaled medicines. These open up the airways and help you breathe. They can be taken with an inhaler or a nebulizer device.  Corticosteroid medicines. These may be given for severe bronchospasm, usually when it is associated with asthma.  Avoiding triggers, such as irritants, infection, or allergies.  Follow these instructions at home: Medicines  Take over-the-counter and prescription medicines only as told by your health care provider.  If you need to use an inhaler or nebulizer to take your medicine, ask your health care  provider to explain how to use it correctly. If you were given a spacer, always use it with your inhaler. Lifestyle  Reduce the number of triggers in your home. To do this: ? Change your heating and air conditioning filter at least once a month. ? Limit your use of fireplaces and wood stoves. ? Do not smoke. Do not allow smoking in your home. ? Avoid using perfumes and fragrances. ? Get rid of pests, such as roaches and mice, and their droppings. ? Remove any mold from your home. ? Keep your house clean and dust free. Use unscented cleaning products. ? Replace carpet with wood, tile, or vinyl flooring. Carpet can trap dander and dust. ? Use allergy-proof pillows, mattress covers, and box spring covers. ? Wash bed sheets and blankets every week in hot water. Dry them in a dryer. ? Use blankets that are made of polyester or cotton. ? Wash your hands often. ? Do not allow pets in your bedroom.  Avoid breathing in cold air when you exercise. General instructions  Have a plan for seeking medical care. Know when to call your health care provider and local emergency services, and where to get emergency care.  Stay up to date on your immunizations.  When you have an episode of bronchospasm, stay calm. Try to relax and breathe more slowly.  If you have asthma, make sure you have an asthma action plan.  Keep all follow-up visits as told by your health care provider. This is important. Contact a health care provider if:  You have muscle aches.  You have chest pain.  The mucus that you   cough up (sputum) changes from clear or white to yellow, green, gray, or bloody.  You have a fever.  Your sputum gets thicker. Get help right away if:  Your wheezing and coughing get worse, even after you take your prescribed medicines.  It gets even harder to breathe.  You develop severe chest pain. Summary  Bronchospasm is a tightening of the airways going into the lungs.  During an episode of  bronchospasm, you may have a harder time breathing. You may cough and make a whistling sound when you breathe (wheeze).  Avoid exposure to triggers such as smoke, dust, mold, animal dander, and fragrances.  When you have an episode of bronchospasm, stay calm. Try to relax and breathe more slowly. This information is not intended to replace advice given to you by your health care provider. Make sure you discuss any questions you have with your health care provider. Document Released: 01/25/2003 Document Revised: 01/19/2016 Document Reviewed: 01/19/2016 Elsevier Interactive Patient Education  2017 Elsevier Inc.     IF you received an x-ray today, you will receive an invoice from [facility]. Please contact [facility] at [phone] with questions or concerns regarding your invoice.   IF you received labwork today, you will receive an invoice from [facility]. Please contact [facility] at [phone] with questions or concerns regarding your invoice.   Our billing staff will not be able to assist you with questions regarding bills from these companies.  You will be contacted with the lab results as soon as they are available. The fastest way to get your results is to activate your My Chart account. Instructions are located on the last page of this paperwork. If you have not heard from us regarding the results in 2 weeks, please contact this office.      

## 2017-05-07 NOTE — Telephone Encounter (Signed)
She is not my patient, and should come in.   Also she has had refills of her albuterol every month.  She possibly is undertreated.  She can see anyone.  Advise her to come in today.

## 2017-05-15 ENCOUNTER — Encounter: Payer: Self-pay | Admitting: Physician Assistant

## 2017-05-18 ENCOUNTER — Other Ambulatory Visit: Payer: Self-pay | Admitting: Physician Assistant

## 2017-06-06 ENCOUNTER — Encounter: Payer: Self-pay | Admitting: *Deleted

## 2017-06-22 ENCOUNTER — Other Ambulatory Visit: Payer: Self-pay | Admitting: Physician Assistant

## 2017-07-06 ENCOUNTER — Other Ambulatory Visit: Payer: Self-pay | Admitting: Physician Assistant

## 2017-07-13 ENCOUNTER — Other Ambulatory Visit: Payer: Self-pay | Admitting: Physician Assistant

## 2017-08-23 ENCOUNTER — Ambulatory Visit: Payer: Self-pay | Admitting: *Deleted

## 2017-08-23 ENCOUNTER — Encounter: Payer: Self-pay | Admitting: Physician Assistant

## 2017-08-23 NOTE — Telephone Encounter (Signed)
Patient phoned with symptoms that may be related to the medication she takes for her b/p Amlodipine 5 MG tab daily. She is experiencing daily fatigue, nausea, foggy headedness and mild ankle swelling. This week she did not take it for 2 days and did not experience the nausea, fatigue and fogginess. B/p was 120-130's/80's on those days. Appointment made for tomorrow to discuss with physician. She does not have a primary at this time.  [facility] [address]  Reason for Disposition . Caller requesting a NON-URGENT new prescription or refill and triager unable to refill per unit policy  Answer Assessment - Initial Assessment Questions 1. SYMPTOMS: "Do you have any symptoms?"     Sleepiness, mild ankle swelling, fatigue, nausea, foggy headed. 2. SEVERITY: If symptoms are present, ask "Are they mild, moderate or severe?"     Ankle swelling is mild. Foggy headed and fatigue is moderate at this time.  Protocols used: MEDICATION QUESTION CALL-A-AH

## 2017-08-24 ENCOUNTER — Encounter: Payer: Self-pay | Admitting: Family Medicine

## 2017-08-24 ENCOUNTER — Ambulatory Visit (INDEPENDENT_AMBULATORY_CARE_PROVIDER_SITE_OTHER): Payer: Federal, State, Local not specified - PPO | Admitting: Family Medicine

## 2017-08-24 VITALS — BP 138/87 | HR 84 | Temp 98.3°F | Resp 18 | Ht 63.39 in | Wt 259.0 lb

## 2017-08-24 DIAGNOSIS — I10 Essential (primary) hypertension: Secondary | ICD-10-CM

## 2017-08-24 DIAGNOSIS — R7303 Prediabetes: Secondary | ICD-10-CM | POA: Diagnosis not present

## 2017-08-24 DIAGNOSIS — G4733 Obstructive sleep apnea (adult) (pediatric): Secondary | ICD-10-CM

## 2017-08-24 DIAGNOSIS — Z9989 Dependence on other enabling machines and devices: Secondary | ICD-10-CM | POA: Diagnosis not present

## 2017-08-24 MED ORDER — LISINOPRIL-HYDROCHLOROTHIAZIDE 10-12.5 MG PO TABS: 1.0000 | ORAL_TABLET | Freq: Every day | ORAL | 1 refills | Status: DC

## 2017-08-24 NOTE — Patient Instructions (Addendum)
Continue checking your blood pressure at home, goal is BP 140/90 or less. If not meeting goal after being on new bp medication for about 2-4 weeks, please come back in for medication adjustment  Continue to work on diet and exercise.     IF you received an x-ray today, you will receive an invoice from [facility]. Please contact [facility] at [phone] with questions or concerns regarding your invoice.   IF you received labwork today, you will receive an invoice from [facility]. Please contact [facility] at [phone] with questions or concerns regarding your invoice.   Our billing staff will not be able to assist you with questions regarding bills from these companies.  You will be contacted with the lab results as soon as they are available. The fastest way to get your results is to activate your My Chart account. Instructions are located on the last page of this paperwork. If you have not heard from us regarding the results in 2 weeks, please contact this office.

## 2017-08-24 NOTE — Progress Notes (Signed)
7/20/20193:59 PM  [name] 29-Aug-1965, 52 y.o. female [id number]  Chief Complaint  Patient presents with  . Hypertension    f/u  . Medication Problem    Norvasc - pt has not taken this med in 2-3 days    HPI:   Patient is a 52 y.o. female with past medical history significant for HTN who presents today for followup  She reports that over the past several weeks-month she has been having worsening side effects from amlodipine Feeling nauseous, tired, noticed HR in the 60s, leg swelling She reports that she has stopped taking her medication for several days at a time and symptoms resolve She is using cpap every night She would like a different BP medication She has been wanting to go to the gym but has not been able to do so given how horrible she feels She checks her BP at home regularly, cuff has been confirmed with ours in the past and is accurate  Fall Risk  08/24/2017 05/07/2017 02/08/2017 04/19/2016 01/02/2016  Falls in the past year? No No No No No     Depression screen [facility] 2/9 08/24/2017 05/07/2017 02/08/2017  Decreased Interest 0 0 1  Down, Depressed, Hopeless 0 0 1  PHQ - 2 Score 0 0 2  Altered sleeping - - 2  Tired, decreased energy - - 3  Change in appetite - - 3  Feeling bad or failure about yourself  - - 0  Trouble concentrating - - 1  Moving slowly or fidgety/restless - - 2  Suicidal thoughts - - 0  PHQ-9 Score - - 13  Difficult doing work/chores - - Somewhat difficult    Allergies  Allergen Reactions  . Bactrim [Sulfamethoxazole-Trimethoprim]     Lip swelling, mouth itching  . Doxycycline     Severe headache  . Rocephin [Ceftriaxone Sodium] Hives, Itching and Other (See Comments)    wheezing    Prior to Admission medications   Medication Sig Start Date End Date Taking? Authorizing Provider  albuterol (PROVENTIL HFA;VENTOLIN HFA) 108 (90 Base) MCG/ACT inhaler Inhale 1-2 puffs into the lungs every 6 (six) hours as needed for wheezing or shortness of  breath. 05/07/17  Yes Wallis Bamberg, PA-C  amLODipine (NORVASC) 5 MG tablet TAKE 1 TABLET BY MOUTH EVERY DAY 06/23/17  Yes Ofilia Neas, PA-C  cetirizine (ZYRTEC) 10 MG tablet Take 1 tablet (10 mg total) by mouth daily. 05/07/17  Yes Wallis Bamberg, PA-C  FLOVENT HFA 44 MCG/ACT inhaler INHALE 2 PUFFS INTO THE LUNGS 2 (TWO) TIMES DAILY. 04/22/17  Yes Garnetta Buddy, PA    Past Medical History:  Diagnosis Date  . Allergy   . Asthma   . Cushing syndrome (HCC)    ?-pt states she was given this diagnosis during a hospitalization.  Records reviewed, no mention of dx or treatment  . Diabetes mellitus   . Heart murmur    mild TR  . Hyperlipidemia   . Hypertension   . Sleep apnea     Past Surgical History:  Procedure Laterality Date  . CERVICAL CONIZATION W/BX  1999  . CHOLECYSTECTOMY N/A 01/31/2014   Procedure: LAPAROSCOPIC CHOLECYSTECTOMY ;  Surgeon: Abigail Miyamoto, MD;  Location: [facility] OR;  Service: General;  Laterality: N/A;  . GALLBLADDER SURGERY  Jan 31, 2014  . TUBAL LIGATION  01/10/2000    Social History   Tobacco Use  . Smoking status: Former Smoker    Packs/day: 0.25    Types: Cigarettes  Last attempt to quit: 02/21/2012    Years since quitting: 5.5  . Smokeless tobacco: Never Used  Substance Use Topics  . Alcohol use: No    Alcohol/week: 0.0 oz    Family History  Problem Relation Age of Onset  . Thyroid disease Sister   . Hyperlipidemia Mother   . Heart murmur Daughter   . Heart murmur Son   . Heart murmur Daughter   . Asthma Daughter   . Hypertension Father   . Cancer Cousin [phone]       female cousin--unknown primary, metastatic  . Pulmonary embolism Daughter [phone]  . Diabetes Neg Hx     Review of Systems  Constitutional: Negative for chills and fever.  Respiratory: Negative for cough and shortness of breath.   Cardiovascular: Negative for chest pain, palpitations and leg swelling.  Gastrointestinal: Negative for abdominal pain, nausea and vomiting.      OBJECTIVE:  Blood pressure (!) 154/93, pulse 84, temperature 98.3 F (36.8 C), temperature source Oral, resp. rate 18, height 5' 3.39" (1.61 m), weight 259 lb (117.5 kg), last menstrual period 05/07/2017.  Physical Exam  Constitutional: She is oriented to person, place, and time. She appears well-developed and well-nourished.  HENT:  Head: Normocephalic and atraumatic.  Mouth/Throat: Oropharynx is clear and moist. No oropharyngeal exudate.  Eyes: Pupils are equal, round, and reactive to light. EOM are normal. No scleral icterus.  Neck: Neck supple.  Cardiovascular: Normal rate, regular rhythm and normal heart sounds. Exam reveals no gallop and no friction rub.  No murmur heard. Pulmonary/Chest: Effort normal and breath sounds normal. She has no wheezes. She has no rales.  Musculoskeletal: She exhibits no edema.  Neurological: She is alert and oriented to person, place, and time.  Skin: Skin is warm and dry.  Psychiatric: She has a normal mood and affect.  Nursing note and vitals reviewed.   ASSESSMENT and PLAN  1. Essential hypertension, benign Reports side effects to medication, changing to ace-hctz. New med r/se/b reviewed. Discussed continued home monitoring. Encouraged LFM changes.   2. Prediabetes Checking labs today. Continue working on LFM - Hemoglobin A1c  3. OSA on CPAP Uses as prescribed. Denies any issues.   Other orders - lisinopril-hydrochlorothiazide (PRINZIDE,ZESTORETIC) 10-12.5 MG tablet; Take 1 tablet by mouth daily.  Return in about 6 months (around 02/24/2018).    Myles LippsIrma M Santiago, MD Primary Care at [facility] [address] [facility], [place] [address] Ph.  [phone] Fax [phone]

## 2017-08-26 LAB — HEMOGLOBIN A1C

## 2017-09-03 ENCOUNTER — Encounter: Payer: Self-pay | Admitting: Family Medicine

## 2017-09-05 ENCOUNTER — Ambulatory Visit (INDEPENDENT_AMBULATORY_CARE_PROVIDER_SITE_OTHER): Payer: Federal, State, Local not specified - PPO | Admitting: Family Medicine

## 2017-09-05 DIAGNOSIS — R7303 Prediabetes: Secondary | ICD-10-CM

## 2017-09-05 LAB — POCT HEMOGLOBIN: Hemoglobin: 7 g/dL — AB (ref 12.2–16.2)

## 2017-09-08 ENCOUNTER — Other Ambulatory Visit: Payer: Self-pay | Admitting: Physician Assistant

## 2017-09-11 ENCOUNTER — Encounter: Payer: Self-pay | Admitting: Family Medicine

## 2017-09-11 NOTE — Progress Notes (Signed)
Lab visit

## 2017-10-10 ENCOUNTER — Other Ambulatory Visit: Payer: Self-pay | Admitting: Urgent Care

## 2017-10-31 ENCOUNTER — Other Ambulatory Visit: Payer: Self-pay | Admitting: Urgent Care

## 2018-02-18 ENCOUNTER — Ambulatory Visit: Payer: Self-pay | Admitting: *Deleted

## 2018-02-18 ENCOUNTER — Ambulatory Visit: Payer: Self-pay

## 2018-02-18 NOTE — Telephone Encounter (Signed)
Pt called with having chest discomfort from anxiety. The discomfort is mild. She states just feel "heavy". She had a couple of her friends that passed. And she is dealing with her 60 yr of daughter having her tonsils out. She has a hx of asthma and has some shortness of breath with cough. She is using her inhaler. She denies cardiac symptom: headache, sweating, dizziness, weakness, vomiting. She is a little nauseous, because she is not eating much and has a nervous stomach. Appointment scheduled per protocol. Advised to call back and go to the ED for worsening symptoms, chest pain, nausea, sweating, dizziness, weakness or shortness of breath. Pt voiced understanding. Routing to flow at PCP.  Reason for Disposition . [1] Chest pain lasting <= 5 minutes AND [2] NO chest pain or cardiac symptoms now(Exceptions: pains lasting a few seconds)  Answer Assessment - Initial Assessment Questions 1. LOCATION: "Where does it hurt?"       More to the left chest area 2. RADIATION: "Does the pain go anywhere else?" (e.g., into neck, jaw, arms, back)     On the left side 3. ONSET: "When did the chest pain begin?" (Minutes, hours or days)      Sunday and yesterday 4. PATTERN "Does the pain come and go, or has it been constant since it started?"  "Does it get worse with exertion?"      Comes and goes.  5. DURATION: "How long does it last" (e.g., seconds, minutes, hours)     seconds 6. SEVERITY: "How bad is the pain?"  (e.g., Scale 1-10; mild, moderate, or severe)    - MILD (1-3): doesn't interfere with normal activities     - MODERATE (4-7): interferes with normal activities or awakens from sleep    - SEVERE (8-10): excruciating pain, unable to do any normal activities       Heaviest in chest is mild 7. CARDIAC RISK FACTORS: "Do you have any history of heart problems or risk factors for heart disease?" (e.g., prior heart attack, angina; high blood pressure, diabetes, being overweight, high cholesterol,  smoking, or strong family history of heart disease)     [place], HTN, overweight, hx of heart disease on father's side (all taking blood thinners) 8. PULMONARY RISK FACTORS: "Do you have any history of lung disease?"  (e.g., blood clots in lung, asthma, emphysema, birth control pills)     Asthma 9. CAUSE: "What do you think is causing the chest pain?"     Chest discomfort could be recent deaths of friends and family having surgery 10. OTHER SYMPTOMS: "Do you have any other symptoms?" (e.g., dizziness, nausea, vomiting, sweating, fever, difficulty breathing, cough)       Some nausea possible not eating enough, some shortness of breath and cough because of asthma 11. PREGNANCY: "Is there any chance you are pregnant?" "When was your last menstrual period?"       No. Had tubal about 30 years ago  Protocols used: CHEST PAIN-A-AH

## 2018-02-18 NOTE — Telephone Encounter (Signed)
Forwarding to [facility]  ----- Message -----  From: Jonell Cluck  Sent: 02/18/2018  8:40 AM EST  To: [facility]  Subject: Appointment Request                 Appointment Request From: Jonell Cluck    With Provider: Wallis Bamberg, PA-C [[facility]]    Preferred Date Range: Any date 02/18/2018 or later    Preferred Times: Any time    Reason for visit: Request an Appointment    Comments:  Anxiety/Chest pains

## 2018-02-19 ENCOUNTER — Ambulatory Visit (INDEPENDENT_AMBULATORY_CARE_PROVIDER_SITE_OTHER): Payer: Federal, State, Local not specified - PPO

## 2018-02-19 ENCOUNTER — Encounter: Payer: Self-pay | Admitting: Family Medicine

## 2018-02-19 ENCOUNTER — Ambulatory Visit: Payer: Federal, State, Local not specified - PPO | Admitting: Family Medicine

## 2018-02-19 ENCOUNTER — Encounter (HOSPITAL_COMMUNITY): Payer: Self-pay

## 2018-02-19 ENCOUNTER — Emergency Department (HOSPITAL_COMMUNITY)
Admission: EM | Admit: 2018-02-19 | Discharge: 2018-02-19 | Disposition: A | Discharge status: Home / Self Care | Payer: Federal, State, Local not specified - PPO | Attending: Emergency Medicine | Admitting: Emergency Medicine

## 2018-02-19 ENCOUNTER — Other Ambulatory Visit: Payer: Self-pay

## 2018-02-19 VITALS — BP 126/82 | HR 98 | Temp 98.0°F | Resp 16 | Ht 63.0 in | Wt 251.0 lb

## 2018-02-19 DIAGNOSIS — R079 Chest pain, unspecified: Secondary | ICD-10-CM

## 2018-02-19 DIAGNOSIS — E876 Hypokalemia: Secondary | ICD-10-CM

## 2018-02-19 DIAGNOSIS — R0789 Other chest pain: Secondary | ICD-10-CM | POA: Diagnosis not present

## 2018-02-19 DIAGNOSIS — Z79899 Other long term (current) drug therapy: Secondary | ICD-10-CM | POA: Insufficient documentation

## 2018-02-19 DIAGNOSIS — J4520 Mild intermittent asthma, uncomplicated: Secondary | ICD-10-CM

## 2018-02-19 DIAGNOSIS — M546 Pain in thoracic spine: Secondary | ICD-10-CM | POA: Diagnosis not present

## 2018-02-19 DIAGNOSIS — E785 Hyperlipidemia, unspecified: Secondary | ICD-10-CM

## 2018-02-19 DIAGNOSIS — F418 Other specified anxiety disorders: Secondary | ICD-10-CM | POA: Diagnosis not present

## 2018-02-19 DIAGNOSIS — I10 Essential (primary) hypertension: Secondary | ICD-10-CM | POA: Insufficient documentation

## 2018-02-19 DIAGNOSIS — Z87891 Personal history of nicotine dependence: Secondary | ICD-10-CM | POA: Diagnosis not present

## 2018-02-19 DIAGNOSIS — J45909 Unspecified asthma, uncomplicated: Secondary | ICD-10-CM | POA: Insufficient documentation

## 2018-02-19 DIAGNOSIS — E119 Type 2 diabetes mellitus without complications: Secondary | ICD-10-CM | POA: Insufficient documentation

## 2018-02-19 LAB — CBC
HEMATOCRIT: 38.1 % (ref 36.0–46.0)
HEMOGLOBIN: 12.3 g/dL (ref 12.0–15.0)
MCH: 23.7 pg — ABNORMAL LOW (ref 26.0–34.0)
MCHC: 32.3 g/dL (ref 30.0–36.0)
MCV: 73.6 fL — AB (ref 80.0–100.0)
NRBC: 0 % (ref 0.0–0.2)
Platelets: 318 10*3/uL (ref 150–400)
RBC: 5.18 MIL/uL — AB (ref 3.87–5.11)
RDW: 14.8 % (ref 11.5–15.5)
WBC: 8.7 10*3/uL (ref 4.0–10.5)

## 2018-02-19 LAB — BASIC METABOLIC PANEL
ANION GAP: 9 (ref 5–15)
BUN: 7 mg/dL (ref 6–20)
CHLORIDE: 101 mmol/L (ref 98–111)
CO2: 28 mmol/L (ref 22–32)
Calcium: 9.1 mg/dL (ref 8.9–10.3)
Creatinine, Ser: 0.92 mg/dL (ref 0.44–1.00)
GFR calc non Af Amer: >60 mL/min (ref >60)
Glucose, Bld: 88 mg/dL (ref 70–99)
POTASSIUM: 3.2 mmol/L — AB (ref 3.5–5.1)
Sodium: 138 mmol/L (ref 135–145)

## 2018-02-19 LAB — I-STAT TROPONIN, ED: Troponin i, poc: 0 ng/mL (ref 0.00–0.08)

## 2018-02-19 MED ORDER — SODIUM CHLORIDE 0.9% FLUSH: 3.0000 mL | Freq: Once | INTRAVENOUS | Status: DC

## 2018-02-19 NOTE — Discharge Instructions (Signed)
Testing today was reassuring.  There is no evidence that you are having a heart attack.  Your potassium was slightly low probably secondary to the blood pressure medication.  Try to eat some foods which contain more potassium.  Follow-up with your primary care doctor for reevaluation in 1 to 2 weeks.  Ask them to check your lipid panel since it has been a while since you had this done.  They can also recheck your potassium at that time.

## 2018-02-19 NOTE — Telephone Encounter (Signed)
Pt being evaluated today for symptoms.

## 2018-02-19 NOTE — Progress Notes (Signed)
By signing my name below, I,Temidayo Atanda-Ogunleye, attest that this documentation has been prepared under the direction and in the presence of Shade Flood, MD. Electronically Signed: Robbi Garter, Scribe 02/19/2018 at 12:43 PM.  Subjective:  Patient ID: [name], female    DOB: 14-May-1965  Age: 53 y.o. MRN: [id number]  CC:  Chief Complaint  Patient presents with  . Anxiety    pt states since Monday she has been dealing with some death in her family and has been having some anxiety   . Chest Discomfort    heavy and pressure since the death of a family friend     HPI [name] is a 53 y.o. female that presents for anxiety and chest discomfort. She has a history of asthma, hypertension, DM.   She has no history of tobacco use.  Diabetes Mellitus Diagnosed 11 years ago. She is not on any medications but Her last A1C was controlled at 6.1 in 07/2017  HTN She is on lisinopril  HLD She is not on medication for hyperlipidemia. Lab Results  Component Value Date   CHOL 261 (H) 10/22/2011   HDL 47 10/22/2011   LDLCALC 172 (H) 10/22/2011   TRIG 209 (H) 10/22/2011   CHOLHDL 5.6 10/22/2011   Anxiety About 2 years ago (1 week adter [place]), one of her friends passed in her presence. She has been grieving since and notes she grieves in cycles that peak close to that time. She feels it is improving with time.  About 1 week ago (last Wednesday), she lost a friend. She was also with this friend when they passed. Following her friend's death, she drove to [place], [place] for her daughters tonsillectomy. She reports around this time she was not sleeping or eating well.  She reports a history of anxiety beginning around 15-16 years ago that she associates with a bad marriage. She was treated with Wellbutrin at that time. Following this she remembers being treated with aprazolium years later but admits she was not compliant.   She reports that her episodes of  anxiety began on Sunday night as she was anticipating returning to work on Tuesday after time off. Her episodes consist of heaviness across the front of her chest (waxes and wanes, currently 3/10 worst 5/10 on Monday evening), pain under her left breast, back soreness and occasional coughing and wheezing.  She reports her chest heaviness has gotten worse since its onset and using her inhaler (albuterol) has not helped. She has used her inhaler x4-5/day for the past couple days, but usually only uses it a couple times a month. Last used puffer 5 hrs ago. Her symptoms are not better with rest and do not change with exertion.   Yesterday she learns tht she lost another child hood friend. Her chest heaviness has lasted all morning today.  History Patient Active Problem List   Diagnosis Date Noted  . Asthma exacerbation, mild 01/31/2014  . Snoring 05/18/2013  . Accelerated hypertension 05/18/2013  . Essential hypertension, benign 06/05/2011  . Diabetes mellitus without mention of complication 06/05/2011  . Asthma 07/21/2010  . Heart murmur 07/21/2010  . Hyperlipidemia 07/21/2010  . Rhinitis, allergic 07/21/2010   Past Medical History:  Diagnosis Date  . Allergy   . Asthma   . Cushing syndrome (HCC)    ?-pt states she was given this diagnosis during a hospitalization.  Records reviewed, no mention of dx or treatment  . Diabetes mellitus   . Heart murmur    mild  TR  . Hyperlipidemia   . Hypertension   . Sleep apnea    Past Surgical History:  Procedure Laterality Date  . CERVICAL CONIZATION W/BX  1999  . CHOLECYSTECTOMY N/A 01/31/2014   Procedure: LAPAROSCOPIC CHOLECYSTECTOMY ;  Surgeon: Abigail Miyamoto, MD;  Location: [facility] OR;  Service: General;  Laterality: N/A;  . GALLBLADDER SURGERY  Jan 31, 2014  . TUBAL LIGATION  01/10/2000   Allergies  Allergen Reactions  . Bactrim [Sulfamethoxazole-Trimethoprim]     Lip swelling, mouth itching  . Doxycycline     Severe headache  .  Rocephin [Ceftriaxone Sodium] Hives, Itching and Other (See Comments)    wheezing   Prior to Admission medications   Medication Sig Start Date End Date Taking? Authorizing Provider  albuterol (PROVENTIL HFA;VENTOLIN HFA) 108 (90 Base) MCG/ACT inhaler INHALE 1-2 PUFFS INTO THE LUNGS EVERY 6 (SIX) HOURS AS NEEDED FOR WHEEZING OR SHORTNESS OF BREATH. 10/10/17  Yes Wallis Bamberg, PA-C  albuterol (PROVENTIL HFA;VENTOLIN HFA) 108 (90 Base) MCG/ACT inhaler INHALE 1-2 PUFFS INTO THE LUNGS EVERY 6 (SIX) HOURS AS NEEDED FOR WHEEZING OR SHORTNESS OF BREATH. 10/31/17  Yes Wallis Bamberg, PA-C  cetirizine (ZYRTEC) 10 MG tablet Take 1 tablet (10 mg total) by mouth daily. 05/07/17  Yes Wallis Bamberg, PA-C  FLOVENT HFA 44 MCG/ACT inhaler INHALE 2 PUFFS INTO THE LUNGS 2 (TWO) TIMES DAILY. 04/22/17  Yes English, Judeth Cornfield D, PA  lisinopril-hydrochlorothiazide (PRINZIDE,ZESTORETIC) 10-12.5 MG tablet Take 1 tablet by mouth daily. 08/24/17  Yes Myles Lipps, MD   Family History  Problem Relation Age of Onset  . Thyroid disease Sister   . Hyperlipidemia Mother   . Heart murmur Daughter   . Heart murmur Son   . Heart murmur Daughter   . Asthma Daughter   . Hypertension Father   . Cancer Cousin 14       female cousin--unknown primary, metastatic  . Pulmonary embolism Daughter 79  . Diabetes Neg Hx    Social History   Socioeconomic History  . Marital status: Divorced    Spouse name: Not on file  . Number of children: 3  . Years of education: College  . Highest education level: Not on file  Occupational History  . Occupation: Counselling psychologist: [employer]  Social Needs  . Financial resource strain: Not on file  . Food insecurity:    Worry: Not on file    Inability: Not on file  . Transportation needs:    Medical: Not on file    Non-medical: Not on file  Tobacco Use  . Smoking status: Former Smoker    Packs/day: 0.25    Types: Cigarettes    Last attempt to quit: 02/21/2012    Years since  quitting: 6.0  . Smokeless tobacco: Never Used  Substance and Sexual Activity  . Alcohol use: No    Alcohol/week: 0.0 standard drinks  . Drug use: No  . Sexual activity: Yes    Partners: Male    Birth control/protection: Surgical    Comment: BTL  Lifestyle  . Physical activity:    Days per week: Not on file    Minutes per session: Not on file  . Stress: Not on file  Relationships  . Social connections:    Talks on phone: Not on file    Gets together: Not on file    Attends religious service: Not on file    Active member of club or organization: Not on file  Attends meetings of clubs or organizations: Not on file    Relationship status: Not on file  . Intimate partner violence:    Fear of current or ex partner: Not on file    Emotionally abused: Not on file    Physically abused: Not on file    Forced sexual activity: Not on file  Other Topics Concern  . Not on file  Social History Narrative   Patient is separated and lives at home with her children.   Patient has three children   Patient has a college education.   Patient is right-handed.   Patient drinks a 12 oz of coffee every morning.    Review of Systems See HPI  Objective:  BP 126/82   Pulse 98   Temp 98 F (36.7 C) (Oral)   Resp 16   Ht 5\' 3"  (1.6 m)   Wt 251 lb (113.9 kg)   SpO2 100%   BMI 44.46 kg/m   Physical Exam Constitutional:      General: She is not in acute distress.    Appearance: Normal appearance.  HENT:     Head: Normocephalic and atraumatic.  Cardiovascular:     Comments: Unable to reproduce pain with pressure on chest Pulmonary:     Effort: Pulmonary effort is normal. No respiratory distress.     Breath sounds: Wheezing (faint heard once on experation; cleared with cough) present.  Musculoskeletal:        General: Tenderness (along latissimus and obliques.) present.     Right lower leg: No edema.     Left lower leg: No edema.     Comments: Unable to reproduce pain with pressure  on chest  Skin:    General: Skin is warm and dry.  Neurological:     Mental Status: She is alert and oriented to person, place, and time.  Psychiatric:        Mood and Affect: Mood normal.        Behavior: Behavior normal.        Thought Content: Thought content normal.   Dg Chest 2 View  Result Date: 02/19/2018 CLINICAL DATA:  Chest heaviness, chest pain EXAM: CHEST - 2 VIEW COMPARISON:  05/07/2017 FINDINGS: Heart and mediastinal contours are within normal limits. No focal opacities or effusions. No acute bony abnormality. IMPRESSION: No active cardiopulmonary disease. Electronically Signed   By: Charlett Nose M.D.   On: 02/19/2018 11:50    EKG: Sinus rhythm, rate 77.  No apparent acute findings. Assessment & Plan:    Skylee Helmke is a 53 y.o. female Chest pain, unspecified type - Plan: EKG 12-Lead, DG Chest 2 View  Thoracic back pain, unspecified back pain laterality, unspecified chronicity  Chest heaviness - Plan: EKG 12-Lead, DG Chest 2 View  Situational anxiety  Hyperlipidemia, unspecified hyperlipidemia type  Mild intermittent asthma, unspecified whether complicated  Chest heaviness, with radiation on the left side.   -  Appears to be associated more with situational stressors, adjustment disorder, but with risk factors of diabetes, hypertension, untreated hyperlipidemia when last checked, advised further evaluation through emergency room.  No acute findings on EKG, patient going by private vehicle.  911 precautions if symptoms changed prior to eval.   - handout given on adjustment disorder.  Plan for follow-up in 1 week to discuss symptoms further, and can decide if medication/counseling indicated that time.  RTC precautions if worse.   No orders of the defined types were placed in this encounter.  Patient Instructions  No acute findings on EKG today, but I am concerned regarding your chest symptoms and further evaluation will need to be performed through the  emergency room.  Please proceed there after leaving our office.  If any worsening of symptoms pull over and call 911.  Some of your symptoms are also likely due to stress or adjustment disorder.  See information below on the symptoms.  Please follow-up with me in the next 1 week and we can discuss those symptoms and chest symptoms further including if new medications needed.  Return to the clinic or go to the nearest emergency room if any of your symptoms worsen or new symptoms occur.   Adjustment Disorder, Adult Adjustment disorder is a group of symptoms that can develop after a stressful life event, such as the loss of a job or serious physical illness. The symptoms can affect how you feel, think, and act. They may interfere with your relationships. Adjustment disorder increases your risk of suicide and substance abuse. If this disorder is not managed early, it can develop into a more serious condition, such as major depressive disorder or post-traumatic stress disorder. What are the causes? This condition happens when you have trouble recovering from or coping with a stressful life event. What increases the risk? You are more likely to develop this condition if:  You have had depression or anxiety.  You are being treated for a long-term (chronic) illness.  You are being treated for an illness that cannot be cured (terminal illness).  You have a family history of mental illness. What are the signs or symptoms? Symptoms of this condition include:  Extreme trouble doing daily tasks, such as going to work.  Sadness, depression, or crying spells.  Worrying a lot.  Loss of enjoyment.  Change in appetite or weight.  Feelings of loss or hopelessness.  Thoughts of suicide.  Anxiety, worry, or nervousness.  Trouble sleeping.  Avoiding family and friends.  Fighting or vandalism.  Complaining of feeling sick without being ill.  Feeling dazed or  disconnected.  Nightmares.  Trouble sleeping.  Irritability.  Reckless driving.  Poor work International aid/development worker.  Ignoring bills. Symptoms of this condition start within three months of the stressful event. They do not last more than six months, unless the stressful circumstances last longer. Normal grieving after the death of a loved one is not a symptom of this condition. How is this diagnosed? To diagnose this condition, your health care provider will ask about what has happened in your life and how it has affected you. He or she may also ask about your medical history and your use of medicines, alcohol, and other substances. Your health care provider may do a physical exam and order lab tests or other studies. You may be referred to a mental health specialist. How is this treated? Treatment options for this condition include:  Counseling or talk therapy. Talk therapy is usually provided by mental health specialists.  Medicines. Certain medicines may help with depression, anxiety, and sleep.  Support groups. These offer emotional support, advice, and guidance. They are made up of people who have had similar experiences.  Observation and time. This is sometimes called "watchful waiting." In this treatment, health care providers monitor your health and behavior without other treatment. Adjustment disorder sometimes gets better on its own with time. Follow these instructions at home:  Take over-the-counter and prescription medicines only as told by your health care provider.  Keep all follow-up visits as told by your health  care provider. This is important. Contact a health care provider if:  Your symptoms do not improve in six months.  Your symptoms get worse. Get help right away if:  You have serious thoughts about hurting yourself or someone else. If you ever feel like you may hurt yourself or others, or have thoughts about taking your own life, get help right away. You can go to  your nearest emergency department or call:  Your local emergency services (911 in the [place]  A suicide crisis helpline, such as the National Suicide Prevention Lifeline at 502-235-13951-(857)145-2808. This is open 24 hours a day. Summary  Adjustment disorder is a group of symptoms that can develop after a stressful life event, such as the loss of a job or serious physical illness. The symptoms can affect how you feel, think, and act. They may interfere with your relationships.  Symptoms of this condition start within three months of the stressful event. They do not last more than six months, unless the stressful circumstances last longer.  Treatment may include talk therapy, medicines, participation in a support group, or observation to see if symptoms improve.  Contact your health care provider if your symptoms get worse or do not improve in six months.  If you ever feel like you may hurt yourself or others, or have thoughts about taking your own life, get help right away. This information is not intended to replace advice given to you by your health care provider. Make sure you discuss any questions you have with your health care provider. Document Released: 09/26/2005 Document Revised: 03/23/2016 Document Reviewed: 03/23/2016 Elsevier Interactive Patient Education  2019 Elsevier Inc.   Nonspecific Chest Pain Chest pain can be caused by many different conditions. Some causes of chest pain can be life-threatening. These will require treatment right away. Serious causes of chest pain include:  Heart attack.  A tear in the body's main blood vessel.  Redness and swelling (inflammation) around your heart.  Blood clot in your lungs. Other causes of chest pain may not be so serious. These include:  Heartburn.  Anxiety or stress.  Damage to bones or muscles in your chest.  Lung infections. Chest pain can feel like:  Pain or discomfort in your chest.  Crushing, pressure, aching, or  squeezing pain.  Burning or tingling.  Dull or sharp pain that is worse when you move, cough, or take a deep breath.  Pain or discomfort that is also felt in your back, neck, jaw, shoulder, or arm, or pain that spreads to any of these areas. It is hard to know whether your pain is caused by something that is serious or something that is not so serious. So it is important to see your doctor right away if you have chest pain. Follow these instructions at home: Medicines  Take over-the-counter and prescription medicines only as told by your doctor.  If you were prescribed an antibiotic medicine, take it as told by your doctor. Do not stop taking the antibiotic even if you start to feel better. Lifestyle   Rest as told by your doctor.  Do not use any products that contain nicotine or tobacco, such as cigarettes, e-cigarettes, and chewing tobacco. If you need help quitting, ask your doctor.  Do not drink alcohol.  Make lifestyle changes as told by your doctor. These may include: ? Getting regular exercise. Ask your doctor what activities are safe for you. ? Eating a heart-healthy diet. A diet and nutrition specialist (dietitian) can help  you to learn healthy eating options. ? Staying at a healthy weight. ? Treating diabetes or high blood pressure, if needed. ? Lowering your stress. Activities such as yoga and relaxation techniques can help. General instructions  Pay attention to any changes in your symptoms. Tell your doctor about them or any new symptoms.  Avoid any activities that cause chest pain.  Keep all follow-up visits as told by your doctor. This is important. You may need more testing if your chest pain does not go away. Contact a doctor if:  Your chest pain does not go away.  You feel depressed.  You have a fever. Get help right away if:  Your chest pain is worse.  You have a cough that gets worse, or you cough up blood.  You have very bad (severe) pain in your  belly (abdomen).  You pass out (faint).  You have either of these for no clear reason: ? Sudden chest discomfort. ? Sudden discomfort in your arms, back, neck, or jaw.  You have shortness of breath at any time.  You suddenly start to sweat, or your skin gets clammy.  You feel sick to your stomach (nauseous).  You throw up (vomit).  You suddenly feel lightheaded or dizzy.  You feel very weak or tired.  Your heart starts to beat fast, or it feels like it is skipping beats. These symptoms may be an emergency. Do not wait to see if the symptoms will go away. Get medical help right away. Call your local emergency services (911 in the [place] Do not drive yourself to the hospital. Summary  Chest pain can be caused by many different conditions. The cause may be serious and need treatment right away. If you have chest pain, see your doctor right away.  Follow your doctor's instructions for taking medicines and making lifestyle changes.  Keep all follow-up visits as told by your doctor. This includes visits for any further testing if your chest pain does not go away.  Be sure to know the signs that show that your condition has become worse. Get help right away if you have these symptoms. This information is not intended to replace advice given to you by your health care provider. Make sure you discuss any questions you have with your health care provider. Document Released: 07/11/2007 Document Revised: 07/25/2017 Document Reviewed: 07/25/2017 Elsevier Interactive Patient Education  [employer].    If you have lab work done today you will be contacted with your lab results within the next 2 weeks.  If you have not heard from [place] then please contact us. The fastest way to get your results is to register for My Chart.   IF you received an x-ray today, you will receive an invoice from [facility]. Please contact [facility] at [phone] with questions or concerns  regarding your invoice.   IF you received labwork today, you will receive an invoice from [facility]. Please contact [facility] at [phone] with questions or concerns regarding your invoice.   Our billing staff will not be able to assist you with questions regarding bills from these companies.  You will be contacted with the lab results as soon as they are available. The fastest way to get your results is to activate your My Chart account. Instructions are located on the last page of this paperwork. If you have not heard from [place] regarding the results in 2 weeks, please contact this office.       Signed,  Meredith StaggersJeffrey Keontay Vora, MD [facility].  02/19/18 1:56 PM

## 2018-02-19 NOTE — ED Provider Notes (Signed)
[facility] EMERGENCY DEPARTMENT Provider Note   CSN: [id number] Arrival date & time: 02/19/18  1308     History   Chief Complaint Chief Complaint  Patient presents with  . Chest Pain    HPI [name] is a 53 y.o. female.  HPI   Patient is here for evaluation of a pressure-like sensation in her center chest present for 3 days, with occasional periods of sharp/electric sensation in the region underneath her left breast.  She has not noticed this discomfort when she is sleeping, and does not awake with pain in her chest.  She does not have any associated shortness of breath, vomiting, diaphoresis or dizziness.  She is occasionally nauseated in the last several days.  She has not tried any medication for this problem yet.  She saw her PCP this morning, and after chest x-ray was done was sent here for evaluation.  The chest x-ray was read as normal.  She does not know her cholesterol.  There is no family history of early cardiac disease.  She does not smoke.  She works for the [employer].  She is under stress due to several people, who she knows, dying in the last several years, one recently.  There are no other known modifying factors.  Past Medical History:  Diagnosis Date  . Allergy   . Asthma   . Cushing syndrome (HCC)    ?-pt states she was given this diagnosis during a hospitalization.  Records reviewed, no mention of dx or treatment  . Diabetes mellitus   . Heart murmur    mild TR  . Hyperlipidemia   . Hypertension   . Sleep apnea     Patient Active Problem List   Diagnosis Date Noted  . Asthma exacerbation, mild 01/31/2014  . Snoring 05/18/2013  . Accelerated hypertension 05/18/2013  . Essential hypertension, benign 06/05/2011  . Diabetes mellitus without mention of complication 06/05/2011  . Asthma 07/21/2010  . Heart murmur 07/21/2010  . Hyperlipidemia 07/21/2010  . Rhinitis, allergic 07/21/2010    Past Surgical History:  Procedure  Laterality Date  . CERVICAL CONIZATION W/BX  1999  . CHOLECYSTECTOMY N/A 01/31/2014   Procedure: LAPAROSCOPIC CHOLECYSTECTOMY ;  Surgeon: Abigail Miyamoto, MD;  Location: [facility] OR;  Service: General;  Laterality: N/A;  . GALLBLADDER SURGERY  Jan 31, 2014  . TUBAL LIGATION  01/10/2000     OB History    Gravida  5   Para  3   Term      Preterm      AB  2   Living  3     SAB  1   TAB  1   Ectopic      Multiple      Live Births               Home Medications    Prior to Admission medications   Medication Sig Start Date End Date Taking? Authorizing Provider  albuterol (PROVENTIL HFA;VENTOLIN HFA) 108 (90 Base) MCG/ACT inhaler INHALE 1-2 PUFFS INTO THE LUNGS EVERY 6 (SIX) HOURS AS NEEDED FOR WHEEZING OR SHORTNESS OF BREATH. 10/10/17   Wallis Bamberg, PA-C  albuterol (PROVENTIL HFA;VENTOLIN HFA) 108 (90 Base) MCG/ACT inhaler INHALE 1-2 PUFFS INTO THE LUNGS EVERY 6 (SIX) HOURS AS NEEDED FOR WHEEZING OR SHORTNESS OF BREATH. 10/31/17   Wallis Bamberg, PA-C  cetirizine (ZYRTEC) 10 MG tablet Take 1 tablet (10 mg total) by mouth daily. 05/07/17   Wallis Bamberg, PA-C  FLOVENT  HFA 44 MCG/ACT inhaler INHALE 2 PUFFS INTO THE LUNGS 2 (TWO) TIMES DAILY. 04/22/17   Trena Platt D, PA  lisinopril-hydrochlorothiazide (PRINZIDE,ZESTORETIC) 10-12.5 MG tablet Take 1 tablet by mouth daily. 08/24/17   Myles Lipps, MD    Family History Family History  Problem Relation Age of Onset  . Thyroid disease Sister   . Hyperlipidemia Mother   . Heart murmur Daughter   . Heart murmur Son   . Heart murmur Daughter   . Asthma Daughter   . Hypertension Father   . Cancer Cousin 31       female cousin--unknown primary, metastatic  . Pulmonary embolism Daughter 62  . Diabetes Neg Hx     Social History Social History   Tobacco Use  . Smoking status: Former Smoker    Packs/day: 0.25    Types: Cigarettes    Last attempt to quit: 02/21/2012    Years since quitting: 6.0  . Smokeless tobacco: Never  Used  Substance Use Topics  . Alcohol use: No    Alcohol/week: 0.0 standard drinks  . Drug use: No     Allergies   Bactrim [sulfamethoxazole-trimethoprim]; Doxycycline; and Rocephin [ceftriaxone sodium]   Review of Systems Review of Systems  All other systems reviewed and are negative.    Physical Exam Updated Vital Signs BP 123/75   Pulse 73   Temp 98.2 F (36.8 C) (Oral)   Resp 18   SpO2 100%   Physical Exam Vitals signs and nursing note reviewed.  Constitutional:      General: She is not in acute distress.    Appearance: She is well-developed. She is obese. She is not ill-appearing, toxic-appearing or diaphoretic.  HENT:     Head: Normocephalic and atraumatic.     Right Ear: External ear normal.     Left Ear: External ear normal.  Eyes:     Conjunctiva/sclera: Conjunctivae normal.     Pupils: Pupils are equal, round, and reactive to light.  Neck:     Musculoskeletal: Normal range of motion and neck supple.     Trachea: Phonation normal.  Cardiovascular:     Rate and Rhythm: Normal rate and regular rhythm.     Heart sounds: Normal heart sounds.  Pulmonary:     Effort: Pulmonary effort is normal.     Breath sounds: Normal breath sounds.  Abdominal:     Palpations: Abdomen is soft.     Tenderness: There is no abdominal tenderness.  Musculoskeletal: Normal range of motion.  Skin:    General: Skin is warm and dry.  Neurological:     Mental Status: She is alert and oriented to person, place, and time.     Cranial Nerves: No cranial nerve deficit.     Sensory: No sensory deficit.     Motor: No abnormal muscle tone.     Coordination: Coordination normal.  Psychiatric:        Mood and Affect: Mood normal.        Behavior: Behavior normal.        Thought Content: Thought content normal.        Judgment: Judgment normal.      ED Treatments / Results  Labs (all labs ordered are listed, but only abnormal results are displayed) Labs Reviewed  BASIC  METABOLIC PANEL - Abnormal; Notable for the following components:      Result Value   Potassium 3.2 (*)    All other components within normal limits  CBC - Abnormal;  Notable for the following components:   RBC 5.18 (*)    MCV 73.6 (*)    MCH 23.7 (*)    All other components within normal limits  I-STAT TROPONIN, ED  I-STAT BETA HCG BLOOD, ED ([facility], [facility], [facility] ONLY)    EKG EKG Interpretation  Date/Time:  Wednesday February 19 2018 13:18:56 EST Ventricular Rate:  79 PR Interval:  214 QRS Duration: 90 QT Interval:  400 QTC Calculation: 458 R Axis:   19 Text Interpretation:  Sinus rhythm with 1st degree A-V block Otherwise normal ECG since last tracing no significant change Confirmed by Mancel BaleWentz, Daxen Lanum [phone]([id number]) on 02/19/2018 4:27:29 PM   Radiology Dg Chest 2 View  Result Date: 02/19/2018 CLINICAL DATA:  Chest heaviness, chest pain EXAM: CHEST - 2 VIEW COMPARISON:  05/07/2017 FINDINGS: Heart and mediastinal contours are within normal limits. No focal opacities or effusions. No acute bony abnormality. IMPRESSION: No active cardiopulmonary disease. Electronically Signed   By: Charlett NoseKevin  Dover M.D.   On: 02/19/2018 11:50    Procedures Procedures (including critical care time)  Medications Ordered in ED Medications  sodium chloride flush (NS) 0.9 % injection 3 mL (has no administration in time range)     Initial Impression / Assessment and Plan / ED Course  I have reviewed the triage vital signs and the nursing notes.  Pertinent labs & imaging results that were available during my care of the patient were reviewed by me and considered in my medical decision making (see chart for details).  Clinical Course as of Feb 19 1713  Wed Feb 19, 2018  1712 I-Stat beta hCG blood, ED [EW]  1712 Normal except potassium slightly low  Basic metabolic panel(!) [EW]  1713 Normal  I-stat troponin, ED [EW]  1713 Normal except MCV low  CBC(!) [EW]    Clinical Course User Index [EW] Mancel BaleWentz, Feliza Diven, MD       Patient Vitals for the past 24 hrs:  BP Temp Temp src Pulse Resp SpO2  02/19/18 1700 123/75 - - 73 18 100 %  02/19/18 1645 125/75 - - (!) 136 12 (!) 83 %  02/19/18 1630 131/85 - - 74 12 100 %  02/19/18 1311 (!) 146/93 98.2 F (36.8 C) Oral 79 15 100 %    5:15 PM Reevaluation with update and discussion. After initial assessment and treatment, an updated evaluation reveals no change in clinical status.  Findings discussed with the patient and all questions were answered. Mancel BaleElliott Shiron Whetsel   Medical Decision Making: Chest pain, stress related, with breathing.  ACS, PE or pneumonia.  Prior cholesterol elevated but otherwise low risk.  Pain is very atypical for cardiac disease.  Chest x-ray done as outpatient was reviewed including images, it is normal.  There is no indication for hospitalization at this time.  CRITICAL CARE-no Performed by: Mancel BaleElliott Ilani Otterson  Nursing Notes Reviewed/ Care Coordinated Applicable Imaging Reviewed Interpretation of Laboratory Data incorporated into ED treatment  The patient appears reasonably screened and/or stabilized for discharge and I doubt any other medical condition or other [facility] requiring further screening, evaluation, or treatment in the ED at this time prior to discharge.  Plan: Home Medications-continue usual; Home Treatments-increase potassium in diet; return here if the recommended treatment, does not improve the symptoms; Recommended follow up-PCP checkup 1 to 2 weeks.   Final Clinical Impressions(s) / ED Diagnoses   Final diagnoses:  Nonspecific chest pain  Hypokalemia    ED Discharge Orders    None  Mancel BaleWentz, Geary Rufo, MD 02/19/18 1715

## 2018-02-19 NOTE — ED Notes (Signed)
Pt alert and oriented in NAD. Pt verbalized understanding of discharge instructions. 

## 2018-02-19 NOTE — ED Triage Notes (Signed)
Patient complains of chest pressure x 2 days. Describes as constant and sent from MD office for further evaluation

## 2018-02-19 NOTE — Patient Instructions (Addendum)
No acute findings on EKG today, but I am concerned regarding your chest symptoms and further evaluation will need to be performed through the emergency room.  Please proceed there after leaving our office.  If any worsening of symptoms pull over and call 911.  Some of your symptoms are also likely due to stress or adjustment disorder.  See information below on the symptoms.  Please follow-up with me in the next 1 week and we can discuss those symptoms and chest symptoms further including if new medications needed.  Return to the clinic or go to the nearest emergency room if any of your symptoms worsen or new symptoms occur.   Adjustment Disorder, Adult Adjustment disorder is a group of symptoms that can develop after a stressful life event, such as the loss of a job or serious physical illness. The symptoms can affect how you feel, think, and act. They may interfere with your relationships. Adjustment disorder increases your risk of suicide and substance abuse. If this disorder is not managed early, it can develop into a more serious condition, such as major depressive disorder or post-traumatic stress disorder. What are the causes? This condition happens when you have trouble recovering from or coping with a stressful life event. What increases the risk? You are more likely to develop this condition if:  You have had depression or anxiety.  You are being treated for a long-term (chronic) illness.  You are being treated for an illness that cannot be cured (terminal illness).  You have a family history of mental illness. What are the signs or symptoms? Symptoms of this condition include:  Extreme trouble doing daily tasks, such as going to work.  Sadness, depression, or crying spells.  Worrying a lot.  Loss of enjoyment.  Change in appetite or weight.  Feelings of loss or hopelessness.  Thoughts of suicide.  Anxiety, worry, or nervousness.  Trouble sleeping.  Avoiding family  and friends.  Fighting or vandalism.  Complaining of feeling sick without being ill.  Feeling dazed or disconnected.  Nightmares.  Trouble sleeping.  Irritability.  Reckless driving.  Poor work International aid/development workerperformance.  Ignoring bills. Symptoms of this condition start within three months of the stressful event. They do not last more than six months, unless the stressful circumstances last longer. Normal grieving after the death of a loved one is not a symptom of this condition. How is this diagnosed? To diagnose this condition, your health care provider will ask about what has happened in your life and how it has affected you. He or she may also ask about your medical history and your use of medicines, alcohol, and other substances. Your health care provider may do a physical exam and order lab tests or other studies. You may be referred to a mental health specialist. How is this treated? Treatment options for this condition include:  Counseling or talk therapy. Talk therapy is usually provided by mental health specialists.  Medicines. Certain medicines may help with depression, anxiety, and sleep.  Support groups. These offer emotional support, advice, and guidance. They are made up of people who have had similar experiences.  Observation and time. This is sometimes called "watchful waiting." In this treatment, health care providers monitor your health and behavior without other treatment. Adjustment disorder sometimes gets better on its own with time. Follow these instructions at home:  Take over-the-counter and prescription medicines only as told by your health care provider.  Keep all follow-up visits as told by your health care  provider. This is important. Contact a health care provider if:  Your symptoms do not improve in six months.  Your symptoms get worse. Get help right away if:  You have serious thoughts about hurting yourself or someone else. If you ever feel like you may  hurt yourself or others, or have thoughts about taking your own life, get help right away. You can go to your nearest emergency department or call:  Your local emergency services (911 in the [place]  A suicide crisis helpline, such as the National Suicide Prevention Lifeline at 765-781-6971. This is open 24 hours a day. Summary  Adjustment disorder is a group of symptoms that can develop after a stressful life event, such as the loss of a job or serious physical illness. The symptoms can affect how you feel, think, and act. They may interfere with your relationships.  Symptoms of this condition start within three months of the stressful event. They do not last more than six months, unless the stressful circumstances last longer.  Treatment may include talk therapy, medicines, participation in a support group, or observation to see if symptoms improve.  Contact your health care provider if your symptoms get worse or do not improve in six months.  If you ever feel like you may hurt yourself or others, or have thoughts about taking your own life, get help right away. This information is not intended to replace advice given to you by your health care provider. Make sure you discuss any questions you have with your health care provider. Document Released: 09/26/2005 Document Revised: 03/23/2016 Document Reviewed: 03/23/2016 Elsevier Interactive Patient Education  2019 Elsevier Inc.   Nonspecific Chest Pain Chest pain can be caused by many different conditions. Some causes of chest pain can be life-threatening. These will require treatment right away. Serious causes of chest pain include:  Heart attack.  A tear in the body's main blood vessel.  Redness and swelling (inflammation) around your heart.  Blood clot in your lungs. Other causes of chest pain may not be so serious. These include:  Heartburn.  Anxiety or stress.  Damage to bones or muscles in your chest.  Lung  infections. Chest pain can feel like:  Pain or discomfort in your chest.  Crushing, pressure, aching, or squeezing pain.  Burning or tingling.  Dull or sharp pain that is worse when you move, cough, or take a deep breath.  Pain or discomfort that is also felt in your back, neck, jaw, shoulder, or arm, or pain that spreads to any of these areas. It is hard to know whether your pain is caused by something that is serious or something that is not so serious. So it is important to see your doctor right away if you have chest pain. Follow these instructions at home: Medicines  Take over-the-counter and prescription medicines only as told by your doctor.  If you were prescribed an antibiotic medicine, take it as told by your doctor. Do not stop taking the antibiotic even if you start to feel better. Lifestyle   Rest as told by your doctor.  Do not use any products that contain nicotine or tobacco, such as cigarettes, e-cigarettes, and chewing tobacco. If you need help quitting, ask your doctor.  Do not drink alcohol.  Make lifestyle changes as told by your doctor. These may include: ? Getting regular exercise. Ask your doctor what activities are safe for you. ? Eating a heart-healthy diet. A diet and nutrition specialist (dietitian) can help you  to learn healthy eating options. ? Staying at a healthy weight. ? Treating diabetes or high blood pressure, if needed. ? Lowering your stress. Activities such as yoga and relaxation techniques can help. General instructions  Pay attention to any changes in your symptoms. Tell your doctor about them or any new symptoms.  Avoid any activities that cause chest pain.  Keep all follow-up visits as told by your doctor. This is important. You may need more testing if your chest pain does not go away. Contact a doctor if:  Your chest pain does not go away.  You feel depressed.  You have a fever. Get help right away if:  Your chest pain is  worse.  You have a cough that gets worse, or you cough up blood.  You have very bad (severe) pain in your belly (abdomen).  You pass out (faint).  You have either of these for no clear reason: ? Sudden chest discomfort. ? Sudden discomfort in your arms, back, neck, or jaw.  You have shortness of breath at any time.  You suddenly start to sweat, or your skin gets clammy.  You feel sick to your stomach (nauseous).  You throw up (vomit).  You suddenly feel lightheaded or dizzy.  You feel very weak or tired.  Your heart starts to beat fast, or it feels like it is skipping beats. These symptoms may be an emergency. Do not wait to see if the symptoms will go away. Get medical help right away. Call your local emergency services (911 in the [place] Do not drive yourself to the hospital. Summary  Chest pain can be caused by many different conditions. The cause may be serious and need treatment right away. If you have chest pain, see your doctor right away.  Follow your doctor's instructions for taking medicines and making lifestyle changes.  Keep all follow-up visits as told by your doctor. This includes visits for any further testing if your chest pain does not go away.  Be sure to know the signs that show that your condition has become worse. Get help right away if you have these symptoms. This information is not intended to replace advice given to you by your health care provider. Make sure you discuss any questions you have with your health care provider. Document Released: 07/11/2007 Document Revised: 07/25/2017 Document Reviewed: 07/25/2017 Elsevier Interactive Patient Education  [employer].    If you have lab work done today you will be contacted with your lab results within the next 2 weeks.  If you have not heard from [place] then please contact us. The fastest way to get your results is to register for My Chart.   IF you received an x-ray today, you will receive an  invoice from [facility]. Please contact [facility] at [phone] with questions or concerns regarding your invoice.   IF you received labwork today, you will receive an invoice from [facility]. Please contact [facility] at [phone] with questions or concerns regarding your invoice.   Our billing staff will not be able to assist you with questions regarding bills from these companies.  You will be contacted with the lab results as soon as they are available. The fastest way to get your results is to activate your My Chart account. Instructions are located on the last page of this paperwork. If you have not heard from [place] regarding the results in 2 weeks, please contact this office.

## 2018-02-20 ENCOUNTER — Encounter

## 2018-02-21 ENCOUNTER — Encounter: Payer: Self-pay | Admitting: Family Medicine

## 2018-02-21 ENCOUNTER — Other Ambulatory Visit: Payer: Self-pay | Admitting: Family Medicine

## 2018-02-24 ENCOUNTER — Ambulatory Visit: Payer: Federal, State, Local not specified - PPO | Admitting: Family Medicine

## 2018-03-07 ENCOUNTER — Other Ambulatory Visit: Payer: Self-pay | Admitting: Family Medicine

## 2018-03-07 DIAGNOSIS — J0100 Acute maxillary sinusitis, unspecified: Secondary | ICD-10-CM

## 2018-03-07 NOTE — Telephone Encounter (Signed)
Requested medication (s) are due for refill today: yes  Requested medication (s) are on the active medication list: yes  Last refill:04/22/17 Historical provider  Future visit scheduled: no  Notes to clinic:  Historical provider    Requested Prescriptions  Pending Prescriptions Disp Refills   fluticasone (FLOVENT HFA) 44 MCG/ACT inhaler 10.6 Inhaler 8     Pulmonology:  Corticosteroids Passed - 03/07/2018 12:25 PM      Passed - Valid encounter within last 12 months    Recent Outpatient Visits          2 weeks ago Chest pain, unspecified type   [facility], Asencion Partridge, MD   6 months ago Pre-diabetes   [facility], Meda Coffee, MD   6 months ago Essential hypertension, benign   [facility], Meda Coffee, MD   10 months ago Cough   [facility] at [facility], [place], [place]   11 months ago Lower respiratory infection (e.g., bronchitis, pneumonia, pneumonitis, pulmonitis)   [facility] at [place], [place], [place]

## 2018-03-07 NOTE — Telephone Encounter (Signed)
LISINOPRIL-HCTZ 10-12.5 MG TAB Requesting early; attempted to reach x 2

## 2018-03-07 NOTE — Telephone Encounter (Unsigned)
Copied from CRM [id number]. Topic: Quick Communication - Rx Refill/Question >> Mar 07, 2018 12:21 PM Arlyss Gandy, NT wrote: Medication: FLOVENT HFA 44 MCG/ACT inhaler   Has the patient contacted their pharmacy? Yes.   (Agent: If no, request that the patient contact the pharmacy for the refill.) (Agent: If yes, when and what did the pharmacy advise?)  Preferred Pharmacy (with phone number or street name): [facility] [phone] [place], [place] - [address] [phone] (Phone) [phone] (Fax)    Agent: Please be advised that RX refills may take up to 3 business days. We ask that you follow-up with your pharmacy.

## 2018-03-07 NOTE — Telephone Encounter (Signed)
Left VM; requesting early

## 2018-03-10 ENCOUNTER — Other Ambulatory Visit: Payer: Self-pay | Admitting: Family Medicine

## 2018-03-10 MED ORDER — FLUTICASONE PROPIONATE HFA 44 MCG/ACT IN AERO: INHALATION_SPRAY | RESPIRATORY_TRACT | 8 refills | Status: DC

## 2018-03-10 NOTE — Telephone Encounter (Signed)
Requested medication (s) are due for refill today: yes  Requested medication (s) are on the active medication list: yes  Last refill:  02/21/2018   #30 0 refills  Future visit scheduled: No Left VM for patient to call and schedule appointment  Notes to clinic:  Needs appointment    Requested Prescriptions  Pending Prescriptions Disp Refills   lisinopril-hydrochlorothiazide (PRINZIDE,ZESTORETIC) 10-12.5 MG tablet [Pharmacy Med Name: LISINOPRIL-HCTZ 10-12.5 MG TAB] 90 tablet 1    Sig: TAKE 1 TABLET BY MOUTH EVERY DAY     Cardiovascular:  ACEI + Diuretic Combos Failed - 03/10/2018  3:37 PM      Failed - K in normal range and within 180 days    Potassium  Date Value Ref Range Status  02/19/2018 3.2 (L) 3.5 - 5.1 mmol/L Final         Failed - Valid encounter within last 6 months    Recent Outpatient Visits          2 weeks ago Chest pain, unspecified type   [facility] at [facility], Asencion Partridge, MD   6 months ago Pre-diabetes   [facility], Meda Coffee, MD   6 months ago Essential hypertension, benign   [facility], Meda Coffee, MD   10 months ago Cough   Primary Care at [facility], [place], [place]   11 months ago Lower respiratory infection (e.g., bronchitis, pneumonia, pneumonitis, pulmonitis)   [facility] Care at [place], [place], [place]             Passed - Na in normal range and within 180 days    Sodium  Date Value Ref Range Status  02/19/2018 138 135 - 145 mmol/L Final  02/08/2017 140 134 - 144 mmol/L Final         Passed - Cr in normal range and within 180 days    Creat  Date Value Ref Range Status  06/05/2011 0.83 0.50 - 1.10 mg/dL Final   Creatinine, Ser  Date Value Ref Range Status  02/19/2018 0.92 0.44 - 1.00 mg/dL Final         Passed - Ca in normal range and within 180 days    Calcium  Date Value Ref Range Status  02/19/2018 9.1 8.9 - 10.3 mg/dL Final         Passed - Patient is not pregnant      Passed - Last  BP in normal range    BP Readings from Last 1 Encounters:  02/19/18 120/77

## 2018-03-10 NOTE — Telephone Encounter (Signed)
Call placed to patient. Left VM to call for appointment.

## 2018-04-15 ENCOUNTER — Other Ambulatory Visit: Payer: Self-pay | Admitting: Family Medicine

## 2018-04-15 NOTE — Telephone Encounter (Signed)
Refill request for lisinopril-hctz; last office visit 08/24/17; no upcoming visits noted; last refill 03/10/2018 # 30; attempted to contact pt; left message on voicemail [phone].  Requested medication (s) are due for refill today: yes  Requested medication (s) are on the active medication list: yes  Last refill:  04/08/2018  Future visit scheduled: no  Notes to clinic:  No valid encounter in last 60 days

## 2018-05-02 ENCOUNTER — Other Ambulatory Visit: Payer: Self-pay

## 2018-05-02 ENCOUNTER — Telehealth (INDEPENDENT_AMBULATORY_CARE_PROVIDER_SITE_OTHER): Payer: Federal, State, Local not specified - PPO | Admitting: Family Medicine

## 2018-05-02 DIAGNOSIS — I10 Essential (primary) hypertension: Secondary | ICD-10-CM | POA: Diagnosis not present

## 2018-05-02 DIAGNOSIS — F411 Generalized anxiety disorder: Secondary | ICD-10-CM

## 2018-05-02 DIAGNOSIS — F5104 Psychophysiologic insomnia: Secondary | ICD-10-CM

## 2018-05-02 DIAGNOSIS — E876 Hypokalemia: Secondary | ICD-10-CM

## 2018-05-02 MED ORDER — LISINOPRIL-HYDROCHLOROTHIAZIDE 10-12.5 MG PO TABS: 1.0000 | ORAL_TABLET | Freq: Every day | ORAL | 1 refills | Status: DC

## 2018-05-02 MED ORDER — HYDROXYZINE HCL 25 MG PO TABS: 12.5000 mg | ORAL_TABLET | Freq: Three times a day (TID) | ORAL | 0 refills | Status: DC | PRN

## 2018-05-02 MED ORDER — BUPROPION HCL 75 MG PO TABS: 75.0000 mg | ORAL_TABLET | Freq: Two times a day (BID) | ORAL | 2 refills | Status: DC

## 2018-05-02 NOTE — Patient Instructions (Addendum)
Try to eat potassium rich foods. Keep a record of your blood pressures outside of the office and if running over 140/90 - let me know.  No change in meds for now. Return for lab visit as planned.   Walking and other coping techniques are very important to help you" recharge the battery".  Meeting with counselor should also help, but I have started you on Wellbutrin twice per day.  It is okay to start that initially once per day for the first 4-5 days and make sure you tolerate it, then can increase to twice per day.  Hydroxyzine can be taken at half pill up to 3 times per day, but start with that at bedtime initially to help you sleep.  It does cause sedation.  Let me know how things are going the next few weeks.  Return to the clinic or go to the nearest emergency room if any of your symptoms worsen or new symptoms occur.   Generalized Anxiety Disorder, Adult Generalized anxiety disorder (GAD) is a mental health disorder. People with this condition constantly worry about everyday events. Unlike normal anxiety, worry related to GAD is not triggered by a specific event. These worries also do not fade or get better with time. GAD interferes with life functions, including relationships, work, and school. GAD can vary from mild to severe. People with severe GAD can have intense waves of anxiety with physical symptoms (panic attacks). What are the causes? The exact cause of GAD is not known. What increases the risk? This condition is more likely to develop in:  Women.  People who have a family history of anxiety disorders.  People who are very shy.  People who experience very stressful life events, such as the death of a loved one.  People who have a very stressful family environment. What are the signs or symptoms? People with GAD often worry excessively about many things in their lives, such as their health and family. They may also be overly concerned about:  Doing well at work.  Being  on time.  Natural disasters.  Friendships. Physical symptoms of GAD include:  Fatigue.  Muscle tension or having muscle twitches.  Trembling or feeling shaky.  Being easily startled.  Feeling like your heart is pounding or racing.  Feeling out of breath or like you cannot take a deep breath.  Having trouble falling asleep or staying asleep.  Sweating.  Nausea, diarrhea, or irritable bowel syndrome (IBS).  Headaches.  Trouble concentrating or remembering facts.  Restlessness.  Irritability. How is this diagnosed? Your health care provider can diagnose GAD based on your symptoms and medical history. You will also have a physical exam. The health care provider will ask specific questions about your symptoms, including how severe they are, when they started, and if they come and go. Your health care provider may ask you about your use of alcohol or drugs, including prescription medicines. Your health care provider may refer you to a mental health specialist for further evaluation. Your health care provider will do a thorough examination and may perform additional tests to rule out other possible causes of your symptoms. To be diagnosed with GAD, a person must have anxiety that:  Is out of his or her control.  Affects several different aspects of his or her life, such as work and relationships.  Causes distress that makes him or her unable to take part in normal activities.  Includes at least three physical symptoms of GAD, such as restlessness,  fatigue, trouble concentrating, irritability, muscle tension, or sleep problems. Before your health care provider can confirm a diagnosis of GAD, these symptoms must be present more days than they are not, and they must last for six months or longer. How is this treated? The following therapies are usually used to treat GAD:  Medicine. Antidepressant medicine is usually prescribed for long-term daily control. Antianxiety medicines  may be added in severe cases, especially when panic attacks occur.  Talk therapy (psychotherapy). Certain types of talk therapy can be helpful in treating GAD by providing support, education, and guidance. Options include: ? Cognitive behavioral therapy (CBT). People learn coping skills and techniques to ease their anxiety. They learn to identify unrealistic or negative thoughts and behaviors and to replace them with positive ones. ? Acceptance and commitment therapy (ACT). This treatment teaches people how to be mindful as a way to cope with unwanted thoughts and feelings. ? Biofeedback. This process trains you to manage your body's response (physiological response) through breathing techniques and relaxation methods. You will work with a therapist while machines are used to monitor your physical symptoms.  Stress management techniques. These include yoga, meditation, and exercise. A mental health specialist can help determine which treatment is best for you. Some people see improvement with one type of therapy. However, other people require a combination of therapies. Follow these instructions at home:  Take over-the-counter and prescription medicines only as told by your health care provider.  Try to maintain a normal routine.  Try to anticipate stressful situations and allow extra time to manage them.  Practice any stress management or self-calming techniques as taught by your health care provider.  Do not punish yourself for setbacks or for not making progress.  Try to recognize your accomplishments, even if they are small.  Keep all follow-up visits as told by your health care provider. This is important. Contact a health care provider if:  Your symptoms do not get better.  Your symptoms get worse.  You have signs of depression, such as: ? A persistently sad, cranky, or irritable mood. ? Loss of enjoyment in activities that used to bring you joy. ? Change in weight or  eating. ? Changes in sleeping habits. ? Avoiding friends or family members. ? Loss of energy for normal tasks. ? Feelings of guilt or worthlessness. Get help right away if:  You have serious thoughts about hurting yourself or others. If you ever feel like you may hurt yourself or others, or have thoughts about taking your own life, get help right away. You can go to your nearest emergency department or call:  Your local emergency services (911 in the [place]  A suicide crisis helpline, such as the National Suicide Prevention Lifeline at 2015857033. This is open 24 hours a day. Summary  Generalized anxiety disorder (GAD) is a mental health disorder that involves worry that is not triggered by a specific event.  People with GAD often worry excessively about many things in their lives, such as their health and family.  GAD may cause physical symptoms such as restlessness, trouble concentrating, sleep problems, frequent sweating, nausea, diarrhea, headaches, and trembling or muscle twitching.  A mental health specialist can help determine which treatment is best for you. Some people see improvement with one type of therapy. However, other people require a combination of therapies. This information is not intended to replace advice given to you by your health care provider. Make sure you discuss any questions you have with your health  care provider. Document Released: 05/19/2012 Document Revised: 12/13/2015 Document Reviewed: 12/13/2015 Elsevier Interactive Patient Education  [employer].    If you have lab work done today you will be contacted with your lab results within the next 2 weeks.  If you have not heard from [place] then please contact us. The fastest way to get your results is to register for My Chart.   IF you received an x-ray today, you will receive an invoice from [facility]. Please contact [facility] at [phone] with questions or concerns  regarding your invoice.   IF you received labwork today, you will receive an invoice from [facility]. Please contact [facility] at [phone] with questions or concerns regarding your invoice.   Our billing staff will not be able to assist you with questions regarding bills from these companies.  You will be contacted with the lab results as soon as they are available. The fastest way to get your results is to activate your My Chart account. Instructions are located on the last page of this paperwork. If you have not heard from [place] regarding the results in 2 weeks, please contact this office.

## 2018-05-02 NOTE — Progress Notes (Signed)
RFV- patient need a refill on her rx lisinopril/hctz. Patient also would like to Dr about anxiety/depression. Was seen at [facility] in Jan with anxiety/ depression, chest pain. Would like to go on a medication to help with the anxiety and depression.

## 2018-05-02 NOTE — Progress Notes (Signed)
Virtual Visit via Telephone Note  I connected with [name] on 05/02/18 at 9:18 AM by telephone and verified that I am speaking with the correct person using two identifiers.   I discussed the limitations, risks, security and privacy concerns of performing an evaluation and management service by telephone and the availability of in person appointments. I also discussed with the patient that there may be a patient responsible charge related to this service. The patient expressed understanding and agreed to proceed, consent obtained  Chief complaint: HTN, anxiety  History of Present Illness:  Hypertension: BP Readings from Last 3 Encounters:  02/19/18 120/77  02/19/18 126/82  08/24/17 138/87   Lab Results  Component Value Date   CREATININE 0.92 02/19/2018  Currently on lisinopril HCTZ 10/12.5 mg daily. Seen January 15.  Chest pain noted at that time, sent to the ER based on type of pain and risk factors.  Suspected some component of adjustment disorder/situational stress at that time.  Normal troponin, EKG without concerning findings.  Chest x-ray without concerns. Potassium was low at 3.2, encourage potassium rich diet. Has not changed diet, but not eating much at that time.  Home readings 125-140/85-90's occasionally  Anxiety/stress: See prior visit.  1 week prior friend passed away.  And also been caring for her daughter after a surgery and decreased sleep, diet changes.  Did note some anxiety 15 to 16 years prior after marriage difficulty.  Treated with Wellbutrin at that time.  Also reported alprazolam in the past.  Worsened anxiety that previous Sunday with anticipation of returning to work after time off.  She had also learned day prior to her last visit that childhood friend had also passed away.  Less anxiety than in January, but still feeling some anxiety.  Anxiety attack in store the other day.  Same sx's next day.  No regular exercise, less walking now. Museum/gallery curator - praying, reading scripture.  Others are leaning on her as well.  Anxious, then heart palpitations and feels in chest - similar to past.  Feels more anxious than depression.  Plans to schedule counseling.  No hx of seizure d/o Depression screen [facility] 2/9 05/02/2018 02/19/2018 08/24/2017 05/07/2017 02/08/2017  Decreased Interest 1 1 0 0 1  Down, Depressed, Hopeless 1 1 0 0 1  PHQ - 2 Score 2 2 0 0 2  Altered sleeping 1 1 - - 2  Tired, decreased energy 3 0 - - 3  Change in appetite 1 0 - - 3  Feeling bad or failure about yourself  0 0 - - 0  Trouble concentrating 1 0 - - 1  Moving slowly or fidgety/restless 0 0 - - 2  Suicidal thoughts 0 0 - - 0  PHQ-9 Score 8 3 - - 13  Difficult doing work/chores Not difficult at all - - - Somewhat difficult          Patient Active Problem List   Diagnosis Date Noted  . Asthma exacerbation, mild 01/31/2014  . Snoring 05/18/2013  . Accelerated hypertension 05/18/2013  . Essential hypertension, benign 06/05/2011  . Diabetes mellitus without mention of complication 06/05/2011  . Asthma 07/21/2010  . Heart murmur 07/21/2010  . Hyperlipidemia 07/21/2010  . Rhinitis, allergic 07/21/2010   Past Medical History:  Diagnosis Date  . Allergy   . Asthma   . Cushing syndrome (HCC)    ?-pt states she was given this diagnosis during a hospitalization.  Records reviewed, no mention of dx or treatment  .  Diabetes mellitus   . Heart murmur    mild TR  . Hyperlipidemia   . Hypertension   . Sleep apnea    Past Surgical History:  Procedure Laterality Date  . CERVICAL CONIZATION W/BX  1999  . CHOLECYSTECTOMY N/A 01/31/2014   Procedure: LAPAROSCOPIC CHOLECYSTECTOMY ;  Surgeon: Abigail Miyamoto, MD;  Location: [facility] OR;  Service: General;  Laterality: N/A;  . GALLBLADDER SURGERY  Jan 31, 2014  . TUBAL LIGATION  01/10/2000   Allergies  Allergen Reactions  . Amlodipine Benzoate     Dizziness, light headness, nausea  . Bactrim  [Sulfamethoxazole-Trimethoprim]     Lip swelling, mouth itching  . Doxycycline     Severe headache  . Rocephin [Ceftriaxone Sodium] Hives, Itching and Other (See Comments)    wheezing   Prior to Admission medications   Medication Sig Start Date End Date Taking? Authorizing Provider  albuterol (PROVENTIL HFA;VENTOLIN HFA) 108 (90 Base) MCG/ACT inhaler INHALE 1-2 PUFFS INTO THE LUNGS EVERY 6 (SIX) HOURS AS NEEDED FOR WHEEZING OR SHORTNESS OF BREATH. 10/10/17   Wallis Bamberg, PA-C  albuterol (PROVENTIL HFA;VENTOLIN HFA) 108 (90 Base) MCG/ACT inhaler INHALE 1-2 PUFFS INTO THE LUNGS EVERY 6 (SIX) HOURS AS NEEDED FOR WHEEZING OR SHORTNESS OF BREATH. 10/31/17   Wallis Bamberg, PA-C  cetirizine (ZYRTEC) 10 MG tablet Take 1 tablet (10 mg total) by mouth daily. 05/07/17   Wallis Bamberg, PA-C  fluticasone (FLOVENT HFA) 44 MCG/ACT inhaler INHALE 2 PUFFS INTO THE LUNGS 2 (TWO) TIMES DAILY. 03/10/18   Myles Lipps, MD  lisinopril-hydrochlorothiazide (PRINZIDE,ZESTORETIC) 10-12.5 MG tablet TAKE 1 TABLET BY MOUTH EVERY DAY 03/14/18   Shade Flood, MD   Social History   Socioeconomic History  . Marital status: Divorced    Spouse name: Not on file  . Number of children: 3  . Years of education: College  . Highest education level: Not on file  Occupational History  . Occupation: Counselling psychologist: [employer]  Social Needs  . Financial resource strain: Not on file  . Food insecurity:    Worry: Not on file    Inability: Not on file  . Transportation needs:    Medical: Not on file    Non-medical: Not on file  Tobacco Use  . Smoking status: Former Smoker    Packs/day: 0.25    Types: Cigarettes    Last attempt to quit: 02/21/2012    Years since quitting: 6.1  . Smokeless tobacco: Never Used  Substance and Sexual Activity  . Alcohol use: No    Alcohol/week: 0.0 standard drinks  . Drug use: No  . Sexual activity: Yes    Partners: Male    Birth control/protection: Surgical    Comment: BTL   Lifestyle  . Physical activity:    Days per week: Not on file    Minutes per session: Not on file  . Stress: Not on file  Relationships  . Social connections:    Talks on phone: Not on file    Gets together: Not on file    Attends religious service: Not on file    Active member of club or organization: Not on file    Attends meetings of clubs or organizations: Not on file    Relationship status: Not on file  . Intimate partner violence:    Fear of current or ex partner: Not on file    Emotionally abused: Not on file    Physically abused: Not on file  Forced sexual activity: Not on file  Other Topics Concern  . Not on file  Social History Narrative   Patient is separated and lives at home with her children.   Patient has three children   Patient has a college education.   Patient is right-handed.   Patient drinks a 12 oz of coffee every morning.     Observations/Objective:   Assessment and Plan: Essential hypertension - Plan: lisinopril-hydrochlorothiazide (PRINZIDE,ZESTORETIC) 10-12.5 MG tablet Hypokalemia  -Borderline on home readings, controlled in office.  Decided against changes for now, plan on blood work for lab only visit to recheck hypokalemia.  Potassium rich foods discussed.  Anxiety state Psychophysiological insomnia  -Suspect component of generalized anxiety disorder, worsened with recent situational stressors, and current pandemic.  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  Coping techniques discussed, follow-up with counselor as planned, start Wellbutrin 75 mg daily initially then twice daily after 4 to 5 days.  Hydroxyzine if needed for anxiety, sleep, update by MyChart next 2 weeks.  Potential side effects and risk of meds discussed   Follow Up Instructions: 5-month follow-up in office, but update by MyChart in 2 weeks regarding anxiety symptoms  I discussed the assessment and treatment plan with the patient. The patient was  provided an opportunity to ask questions and all were answered. The patient agreed with the plan and demonstrated an understanding of the instructions.   The patient was advised to call back or seek an in-person evaluation if the symptoms worsen or if the condition fails to improve as anticipated.  I provided 18 minutes of non-face-to-face time during this encounter.  Signed,   [name], MD Primary Care at [facility].  05/02/18

## 2018-05-05 ENCOUNTER — Ambulatory Visit: Payer: Federal, State, Local not specified - PPO

## 2018-07-23 ENCOUNTER — Other Ambulatory Visit: Payer: Self-pay | Admitting: Family Medicine

## 2018-07-23 DIAGNOSIS — F5104 Psychophysiologic insomnia: Secondary | ICD-10-CM

## 2018-07-23 DIAGNOSIS — F411 Generalized anxiety disorder: Secondary | ICD-10-CM

## 2018-07-23 NOTE — Telephone Encounter (Signed)
Will [place] be taking over this rx

## 2018-07-31 ENCOUNTER — Ambulatory Visit: Payer: Federal, State, Local not specified - PPO | Admitting: Family Medicine

## 2018-08-01 ENCOUNTER — Encounter: Payer: Self-pay | Admitting: Family Medicine

## 2018-08-11 ENCOUNTER — Other Ambulatory Visit: Payer: Self-pay | Admitting: Family Medicine

## 2018-08-11 DIAGNOSIS — F411 Generalized anxiety disorder: Secondary | ICD-10-CM

## 2018-08-11 NOTE — Telephone Encounter (Signed)
Requested medication (s) are due for refill today: yes  Requested medication (s) are on the active medication list: yes  Last refill:  07/16/2018  Future visit scheduled: no  Notes to clinic: no valid encounter in last 6 months; request 90 day refill    Requested Prescriptions  Pending Prescriptions Disp Refills   buPROPion (WELLBUTRIN) 75 MG tablet [Pharmacy Med Name: BUPROPION HCL 75 MG TABLET] 180 tablet 1    Sig: TAKE 1 TABLET BY MOUTH TWICE A DAY     Psychiatry: Antidepressants - bupropion Failed - 08/11/2018  1:31 PM      Failed - Valid encounter within last 6 months    Recent Outpatient Visits          5 months ago Chest pain, unspecified type   [facility], Ranell Patrick, MD   11 months ago Pre-diabetes   [facility], Lilia Argue, MD   11 months ago Essential hypertension, benign   [facility], Lilia Argue, MD   1 year ago Cough   Primary Care at [place], [place]   1 year ago Lower respiratory infection (e.g., bronchitis, pneumonia, pneumonitis, pulmonitis)   Primary Care at [place], [place], [place]             Failed - Completed PHQ-2 or PHQ-9 in the last 360 days.      Passed - Last BP in normal range    BP Readings from Last 1 Encounters:  02/19/18 120/77

## 2018-08-18 ENCOUNTER — Other Ambulatory Visit: Payer: Self-pay | Admitting: Family Medicine

## 2018-08-18 DIAGNOSIS — F411 Generalized anxiety disorder: Secondary | ICD-10-CM

## 2018-08-18 MED ORDER — BUPROPION HCL 75 MG PO TABS: 75.0000 mg | ORAL_TABLET | Freq: Two times a day (BID) | ORAL | 1 refills | Status: DC

## 2018-08-18 NOTE — Telephone Encounter (Signed)
Please Advise  Patient is requesting a refill of the following medications: Requested Prescriptions   Pending Prescriptions Disp Refills  . buPROPion (WELLBUTRIN) 75 MG tablet [Pharmacy Med Name: BUPROPION HCL 75 MG TABLET] 180 tablet 1    Sig: TAKE 1 TABLET BY MOUTH TWICE A DAY    Date of patient request: 08/11/18 Last office visit: 05/02/18 Date of last refill: 05/02/18 Last refill amount: 60 tab Follow up time period per chart: n/a

## 2018-09-11 DIAGNOSIS — F324 Major depressive disorder, single episode, in partial remission: Secondary | ICD-10-CM | POA: Diagnosis not present

## 2018-09-11 DIAGNOSIS — F411 Generalized anxiety disorder: Secondary | ICD-10-CM | POA: Diagnosis not present

## 2018-09-25 DIAGNOSIS — F411 Generalized anxiety disorder: Secondary | ICD-10-CM | POA: Diagnosis not present

## 2018-09-25 DIAGNOSIS — F324 Major depressive disorder, single episode, in partial remission: Secondary | ICD-10-CM | POA: Diagnosis not present

## 2018-10-02 DIAGNOSIS — F324 Major depressive disorder, single episode, in partial remission: Secondary | ICD-10-CM | POA: Diagnosis not present

## 2018-10-02 DIAGNOSIS — F411 Generalized anxiety disorder: Secondary | ICD-10-CM | POA: Diagnosis not present

## 2018-10-09 DIAGNOSIS — F411 Generalized anxiety disorder: Secondary | ICD-10-CM | POA: Diagnosis not present

## 2018-10-09 DIAGNOSIS — F324 Major depressive disorder, single episode, in partial remission: Secondary | ICD-10-CM | POA: Diagnosis not present

## 2018-10-11 ENCOUNTER — Other Ambulatory Visit: Payer: Self-pay

## 2018-10-11 ENCOUNTER — Ambulatory Visit
Admission: EM | Admit: 2018-10-11 | Discharge: 2018-10-11 | Disposition: A | Discharge status: Home / Self Care | Payer: Federal, State, Local not specified - PPO | Attending: Physician Assistant | Admitting: Physician Assistant

## 2018-10-11 DIAGNOSIS — W57XXXA Bitten or stung by nonvenomous insect and other nonvenomous arthropods, initial encounter: Secondary | ICD-10-CM

## 2018-10-11 DIAGNOSIS — S50862A Insect bite (nonvenomous) of left forearm, initial encounter: Secondary | ICD-10-CM

## 2018-10-11 MED ORDER — PREDNISONE 50 MG PO TABS: 50.0000 mg | ORAL_TABLET | Freq: Every day | ORAL | 0 refills | Status: DC

## 2018-10-11 MED ORDER — TRIAMCINOLONE ACETONIDE 0.1 % EX CREA: 1.0000 "application " | TOPICAL_CREAM | Freq: Two times a day (BID) | CUTANEOUS | 0 refills | Status: DC

## 2018-10-11 NOTE — ED Provider Notes (Signed)
[facility]    CSN: [id number] Arrival date & time: 10/11/18  1340      History   Chief Complaint Chief Complaint  Patient presents with  . Insect Bite    HPI [name] is a 53 y.o. female.   53 year old female comes in for possible insect bite to the left forearm after doing yard work.  States felt a stung to the left forearm, with erythema and pain.  Denies itching, oral swelling, shortness of breath. Denies abdominal pain, nausea, vomiting.  She states there was many branches, but feels that an insect may have stung her.  She has not tried anything for the symptoms.  She noticed significant erythema, and came in for evaluation.     Past Medical History:  Diagnosis Date  . Allergy   . Asthma   . Cushing syndrome (HCC)    ?-pt states she was given this diagnosis during a hospitalization.  Records reviewed, no mention of dx or treatment  . Diabetes mellitus   . Heart murmur    mild TR  . Hyperlipidemia   . Hypertension   . Sleep apnea     Patient Active Problem List   Diagnosis Date Noted  . Asthma exacerbation, mild 01/31/2014  . Snoring 05/18/2013  . Accelerated hypertension 05/18/2013  . Essential hypertension, benign 06/05/2011  . Diabetes mellitus without mention of complication 06/05/2011  . Asthma 07/21/2010  . Heart murmur 07/21/2010  . Hyperlipidemia 07/21/2010  . Rhinitis, allergic 07/21/2010    Past Surgical History:  Procedure Laterality Date  . CERVICAL CONIZATION W/BX  1999  . CHOLECYSTECTOMY N/A 01/31/2014   Procedure: LAPAROSCOPIC CHOLECYSTECTOMY ;  Surgeon: Abigail Miyamoto, MD;  Location: [facility] OR;  Service: General;  Laterality: N/A;  . GALLBLADDER SURGERY  Jan 31, 2014  . TUBAL LIGATION  01/10/2000    OB History    Gravida  5   Para  3   Term      Preterm      AB  2   Living  3     SAB  1   TAB  1   Ectopic      Multiple      Live Births               Home Medications    Prior to Admission  medications   Medication Sig Start Date End Date Taking? Authorizing Provider  albuterol (PROVENTIL HFA;VENTOLIN HFA) 108 (90 Base) MCG/ACT inhaler INHALE 1-2 PUFFS INTO THE LUNGS EVERY 6 (SIX) HOURS AS NEEDED FOR WHEEZING OR SHORTNESS OF BREATH. 10/10/17   Wallis Bamberg, PA-C  albuterol (PROVENTIL HFA;VENTOLIN HFA) 108 (90 Base) MCG/ACT inhaler INHALE 1-2 PUFFS INTO THE LUNGS EVERY 6 (SIX) HOURS AS NEEDED FOR WHEEZING OR SHORTNESS OF BREATH. 10/31/17   Wallis Bamberg, PA-C  buPROPion (WELLBUTRIN) 75 MG tablet Take 1 tablet (75 mg total) by mouth 2 (two) times daily. 08/18/18   Myles Lipps, MD  cetirizine (ZYRTEC) 10 MG tablet Take 1 tablet (10 mg total) by mouth daily. 05/07/17   Wallis Bamberg, PA-C  fluticasone (FLOVENT HFA) 44 MCG/ACT inhaler INHALE 2 PUFFS INTO THE LUNGS 2 (TWO) TIMES DAILY. 03/10/18   Myles Lipps, MD  hydrOXYzine (ATARAX/VISTARIL) 25 MG tablet TAKE 0.5-1 TABLETS BY MOUTH 3 TIMES DAILY AS NEEDED FOR ANXIETY/SLEEP 07/24/18   Shade Flood, MD  lisinopril-hydrochlorothiazide (PRINZIDE,ZESTORETIC) 10-12.5 MG tablet Take 1 tablet by mouth daily. 05/02/18   Shade Flood, MD  predniSONE (DELTASONE)  50 MG tablet Take 1 tablet (50 mg total) by mouth daily with breakfast. 10/11/18   Tasia Catchings, Verenise Moulin V, PA-C  triamcinolone cream (KENALOG) 0.1 % Apply 1 application topically 2 (two) times daily. 10/11/18   Ok Edwards, PA-C    Family History Family History  Problem Relation Age of Onset  . Thyroid disease Sister   . Hyperlipidemia Mother   . Heart murmur Daughter   . Heart murmur Son   . Heart murmur Daughter   . Asthma Daughter   . Hypertension Father   . Cancer Cousin 28       female cousin--unknown primary, metastatic  . Pulmonary embolism Daughter 8  . Diabetes Neg Hx     Social History Social History   Tobacco Use  . Smoking status: Former Smoker    Packs/day: 0.25    Types: Cigarettes    Quit date: 02/21/2012    Years since quitting: 6.6  . Smokeless tobacco: Never Used   Substance Use Topics  . Alcohol use: No    Alcohol/week: 0.0 standard drinks  . Drug use: No     Allergies   Amlodipine benzoate, Bactrim [sulfamethoxazole-trimethoprim], Doxycycline, and Rocephin [ceftriaxone sodium]   Review of Systems Review of Systems  Reason unable to perform ROS: See HPI as above.     Physical Exam Triage Vital Signs ED Triage Vitals  Enc Vitals Group     BP 10/11/18 1349 116/81     Pulse Rate 10/11/18 1349 (!) 108     Resp 10/11/18 1349 18     Temp 10/11/18 1349 98 F (36.7 C)     Temp Source 10/11/18 1349 Oral     SpO2 10/11/18 1349 98 %     Weight --      Height --      Head Circumference --      Peak Flow --      Pain Score 10/11/18 1350 7     Pain Loc --      Pain Edu? --      Excl. in [place]? --    No data found.  Updated Vital Signs BP 116/81 (BP Location: Right Arm)   Pulse (!) 108   Temp 98 F (36.7 C) (Oral)   Resp 18   SpO2 98%   Physical Exam Constitutional:      General: She is not in acute distress.    Appearance: She is well-developed. She is not diaphoretic.  HENT:     Head: Normocephalic and atraumatic.  Eyes:     Conjunctiva/sclera: Conjunctivae normal.     Pupils: Pupils are equal, round, and reactive to light.  Pulmonary:     Effort: Pulmonary effort is normal. No respiratory distress.  Skin:    General: Skin is warm and dry.     Comments: About 5cm x 4cm erythema to the medal left forearm. Mild warmth. No induration/fluctuance. No streaking. Tender to touch.   Neurological:     Mental Status: She is alert and oriented to person, place, and time.      UC Treatments / Results  Labs (all labs ordered are listed, but only abnormal results are displayed) Labs Reviewed - No data to display  EKG   Radiology No results found.  Procedures Procedures (including critical care time)  Medications Ordered in UC Medications - No data to display  Initial Impression / Assessment and Plan / UC Course  I have  reviewed the triage vital signs and the nursing notes.  Pertinent labs & imaging results that were available during my care of the patient were reviewed by me and considered in my medical decision making (see chart for details).    Given significant erythema to the area shortly after incident, will cover for inflammation with prednisone. Ice compress. Return precautions given.  Final Clinical Impressions(s) / UC Diagnoses   Final diagnoses:  Insect bite of left forearm, initial encounter    ED Prescriptions    Medication Sig Dispense Auth. Provider   predniSONE (DELTASONE) 50 MG tablet Take 1 tablet (50 mg total) by mouth daily with breakfast. 5 tablet Hollace Michelli V, PA-C   triamcinolone cream (KENALOG) 0.1 % Apply 1 application topically 2 (two) times daily. 30 g Threasa AlphaYu, Jesus Poplin V, PA-C        Patria Warzecha V, [place] JerseyPA-C 10/11/18 1448

## 2018-10-11 NOTE — ED Triage Notes (Signed)
Per pt she was on in her yard working and cutting shrubs and felt something stink her left arm. There is a raised area on the left forearm. Tender and says feels like something is still stinging her.

## 2018-10-11 NOTE — Discharge Instructions (Signed)
Prednisone as directed. Triamcinolone cream as needed. Ice compress daily until symptoms resolve. Monitor for spreading redness, warmth, increased swelling/pain, follow up for reevaluation needed.

## 2018-10-12 ENCOUNTER — Other Ambulatory Visit: Payer: Self-pay | Admitting: Family Medicine

## 2018-10-12 DIAGNOSIS — J0100 Acute maxillary sinusitis, unspecified: Secondary | ICD-10-CM

## 2018-10-16 DIAGNOSIS — F324 Major depressive disorder, single episode, in partial remission: Secondary | ICD-10-CM | POA: Diagnosis not present

## 2018-10-16 DIAGNOSIS — F411 Generalized anxiety disorder: Secondary | ICD-10-CM | POA: Diagnosis not present

## 2018-10-23 DIAGNOSIS — F411 Generalized anxiety disorder: Secondary | ICD-10-CM | POA: Diagnosis not present

## 2018-10-23 DIAGNOSIS — F324 Major depressive disorder, single episode, in partial remission: Secondary | ICD-10-CM | POA: Diagnosis not present

## 2018-11-04 ENCOUNTER — Other Ambulatory Visit: Payer: Self-pay | Admitting: Family Medicine

## 2018-11-04 DIAGNOSIS — I10 Essential (primary) hypertension: Secondary | ICD-10-CM

## 2018-11-04 NOTE — Telephone Encounter (Signed)
LVM to schedule appt for med refills.

## 2018-11-04 NOTE — Telephone Encounter (Signed)
Need a f/u appt for medication review

## 2018-11-04 NOTE — Telephone Encounter (Signed)
Forwarding medication refill request to the clinical pool for review. 

## 2018-11-05 ENCOUNTER — Other Ambulatory Visit: Payer: Self-pay | Admitting: Family Medicine

## 2018-11-05 NOTE — Telephone Encounter (Signed)
Requested medication (s) are due for refill today: yes  Requested medication (s) are on the active medication list: yes  Last refill:  10/10/2017  Future visit scheduled:yes  Notes to clinic:  Review for refill   Requested Prescriptions  Pending Prescriptions Disp Refills   albuterol (VENTOLIN HFA) 108 (90 Base) MCG/ACT inhaler [Pharmacy Med Name: ALBUTEROL HFA (PROAIR) INHALER]  5    Sig: INHALE 1-2 PUFFS INTO THE LUNGS EVERY 6 (SIX) HOURS AS NEEDED FOR WHEEZING OR SHORTNESS OF BREATH.     Pulmonology:  Beta Agonists Failed - 11/05/2018  8:40 AM      Failed - One inhaler should last at least one month. If the patient is requesting refills earlier, contact the patient to check for uncontrolled symptoms.      Passed - Valid encounter within last 12 months    Recent Outpatient Visits          6 months ago Essential hypertension   [facility], Ranell Patrick, MD   8 months ago Chest pain, unspecified type   [facility], Ranell Patrick, MD   1 year ago Pre-diabetes   [facility], Lilia Argue, MD   1 year ago Essential hypertension, benign   [facility], Lilia Argue, MD   1 year ago Cough   [facility] Care at [facility], Freida Busman, [place]      Future Appointments            In 6 days Rutherford Guys, MD [facility], [facility]

## 2018-11-11 ENCOUNTER — Ambulatory Visit: Payer: Federal, State, Local not specified - PPO | Admitting: Family Medicine

## 2018-11-11 ENCOUNTER — Other Ambulatory Visit: Payer: Self-pay

## 2018-11-11 ENCOUNTER — Encounter: Payer: Self-pay | Admitting: Family Medicine

## 2018-11-11 VITALS — BP 137/85 | HR 70 | Temp 98.1°F | Ht 63.0 in | Wt 248.8 lb

## 2018-11-11 DIAGNOSIS — Z23 Encounter for immunization: Secondary | ICD-10-CM

## 2018-11-11 DIAGNOSIS — Z1211 Encounter for screening for malignant neoplasm of colon: Secondary | ICD-10-CM

## 2018-11-11 DIAGNOSIS — R7303 Prediabetes: Secondary | ICD-10-CM | POA: Diagnosis not present

## 2018-11-11 DIAGNOSIS — E785 Hyperlipidemia, unspecified: Secondary | ICD-10-CM

## 2018-11-11 DIAGNOSIS — G4733 Obstructive sleep apnea (adult) (pediatric): Secondary | ICD-10-CM

## 2018-11-11 DIAGNOSIS — F418 Other specified anxiety disorders: Secondary | ICD-10-CM | POA: Diagnosis not present

## 2018-11-11 DIAGNOSIS — I10 Essential (primary) hypertension: Secondary | ICD-10-CM | POA: Diagnosis not present

## 2018-11-11 DIAGNOSIS — Z1231 Encounter for screening mammogram for malignant neoplasm of breast: Secondary | ICD-10-CM

## 2018-11-11 DIAGNOSIS — Z9989 Dependence on other enabling machines and devices: Secondary | ICD-10-CM

## 2018-11-11 DIAGNOSIS — J4540 Moderate persistent asthma, uncomplicated: Secondary | ICD-10-CM

## 2018-11-11 MED ORDER — FLUTICASONE-SALMETEROL 250-50 MCG/DOSE IN AEPB: 1.0000 | INHALATION_SPRAY | Freq: Two times a day (BID) | RESPIRATORY_TRACT | 3 refills | Status: DC

## 2018-11-11 MED ORDER — HYDROXYZINE HCL 25 MG PO TABS: ORAL_TABLET | ORAL | 5 refills | Status: DC

## 2018-11-11 MED ORDER — ESCITALOPRAM OXALATE 20 MG PO TABS: 20.0000 mg | ORAL_TABLET | Freq: Every day | ORAL | 2 refills | Status: DC

## 2018-11-11 NOTE — Progress Notes (Signed)
10/6/202011:34 AM  [name] 11/20/1965, 53 y.o., female [id number]  No chief complaint on file.   HPI:   Patient is a 53 y.o. 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 Depression partially controlled Has been on lexapro in the past for hotflashes hydroxyzine works well for panic attacks  Asthma has not been well controlled of recent Having more wheezing, mild SOB Uses flovent 88mcg BID Using albuterol daily Used to be on advair - asthma was well controlled then Unsure why changed to flovent Denies any reflux Recently had prednisone for spider bite and breathing sign improved, did not need any albuterol while on it, now for past week or so, wheezing and SOB coming back  Uses cpap every night  Lab Results  Component Value Date   HGBA1C CANCELED 08/24/2017   Lab Results  Component Value Date   CREATININE 0.92 02/19/2018   BUN 7 02/19/2018   NA 138 02/19/2018   K 3.2 (L) 02/19/2018   CL 101 02/19/2018   CO2 28 02/19/2018   Lab Results  Component Value Date   ALT 13 01/31/2014   AST 21 01/31/2014   ALKPHOS 72 01/31/2014   BILITOT 0.3 01/31/2014   Lab Results  Component Value Date   CHOL 261 (H) 10/22/2011   HDL 47 10/22/2011   LDLCALC 172 (H) 10/22/2011   TRIG 209 (H) 10/22/2011   CHOLHDL 5.6 10/22/2011    Depression screen PHQ 2/9 05/02/2018 02/19/2018 08/24/2017  Decreased Interest 1 1 0  Down, Depressed, Hopeless 1 1 0  PHQ - 2 Score 2 2 0  Altered sleeping 1 1 -  Tired, decreased energy 3 0 -  Change in appetite 1 0 -  Feeling bad or failure about yourself  0 0 -  Trouble concentrating 1 0 -  Moving slowly or fidgety/restless 0 0 -  Suicidal thoughts 0 0 -  PHQ-9 Score 8 3 -  Difficult doing work/chores Not difficult at  all - -    Fall Risk  05/02/2018 02/19/2018 08/24/2017 05/07/2017 02/08/2017  Falls in the past year? 0 0 No No No  Number falls in past yr: 0 0 - - -  Injury with Fall? 0 0 - - -     Allergies  Allergen Reactions  . Amlodipine Benzoate     Dizziness, light headness, nausea  . Bactrim [Sulfamethoxazole-Trimethoprim]     Lip swelling, mouth itching  . Doxycycline     Severe headache  . Rocephin [Ceftriaxone Sodium] Hives, Itching and Other (See Comments)    wheezing    Prior to Admission medications   Medication Sig Start Date End Date Taking? Authorizing Provider  albuterol (PROVENTIL HFA;VENTOLIN HFA) 108 (90 Base) MCG/ACT inhaler INHALE 1-2 PUFFS INTO THE LUNGS EVERY 6 (SIX) HOURS AS NEEDED FOR WHEEZING OR SHORTNESS OF BREATH. 10/31/17   Wallis BambergMani, Mario, PA-C  albuterol (VENTOLIN HFA) 108 (90 Base) MCG/ACT inhaler INHALE 1-2 PUFFS INTO THE LUNGS EVERY 6 (SIX) HOURS AS NEEDED FOR WHEEZING OR SHORTNESS OF BREATH. 11/05/18   Myles LippsSantiago, Grayling Schranz M, MD  buPROPion (WELLBUTRIN) 75 MG tablet Take 1 tablet (75 mg total) by mouth 2 (two) times daily. 08/18/18   Myles LippsSantiago, Javeria Briski M, MD  cetirizine (ZYRTEC) 10 MG tablet Take 1 tablet (10 mg total) by  mouth daily. 05/07/17   Wallis Bamberg, PA-C  fluticasone (FLOVENT HFA) 44 MCG/ACT inhaler TAKE 2 PUFFS BY MOUTH TWICE A DAY 10/12/18   Myles Lipps, MD  hydrOXYzine (ATARAX/VISTARIL) 25 MG tablet TAKE 0.5-1 TABLETS BY MOUTH 3 TIMES DAILY AS NEEDED FOR ANXIETY/SLEEP 07/24/18   Shade Flood, MD  lisinopril-hydrochlorothiazide (ZESTORETIC) 10-12.5 MG tablet TAKE 1 TABLET BY MOUTH EVERY DAY 11/04/18   Shade Flood, MD  predniSONE (DELTASONE) 50 MG tablet Take 1 tablet (50 mg total) by mouth daily with breakfast. 10/11/18   Cathie Hoops, Amy V, PA-C  triamcinolone cream (KENALOG) 0.1 % Apply 1 application topically 2 (two) times daily. 10/11/18   Belinda Fisher, PA-C    Past Medical History:  Diagnosis Date  . Allergy   . Asthma   . Cushing syndrome (HCC)    ?-pt states she was  given this diagnosis during a hospitalization.  Records reviewed, no mention of dx or treatment  . Diabetes mellitus   . Heart murmur    mild TR  . Hyperlipidemia   . Hypertension   . Sleep apnea     Past Surgical History:  Procedure Laterality Date  . CERVICAL CONIZATION W/BX  1999  . CHOLECYSTECTOMY N/A 01/31/2014   Procedure: LAPAROSCOPIC CHOLECYSTECTOMY ;  Surgeon: Abigail Miyamoto, MD;  Location: [facility];  Service: General;  Laterality: N/A;  . GALLBLADDER SURGERY  Jan 31, 2014  . TUBAL LIGATION  01/10/2000    Social History   Tobacco Use  . Smoking status: Former Smoker    Packs/day: 0.25    Types: Cigarettes    Quit date: 02/21/2012    Years since quitting: 6.7  . Smokeless tobacco: Never Used  Substance Use Topics  . Alcohol use: No    Alcohol/week: 0.0 standard drinks    Family History  Problem Relation Age of Onset  . Thyroid disease Sister   . Hyperlipidemia Mother   . Heart murmur Daughter   . Heart murmur Son   . Heart murmur Daughter   . Asthma Daughter   . Hypertension Father   . Cancer Cousin 59       female cousin--unknown primary, metastatic  . Pulmonary embolism Daughter 91  . Diabetes Neg Hx     Review of Systems  Constitutional: Negative for chills and fever.  Respiratory: Positive for shortness of breath and wheezing. Negative for cough.   Cardiovascular: Negative for chest pain, palpitations and leg swelling.  Gastrointestinal: Negative for abdominal pain, nausea and vomiting.  per hpi   OBJECTIVE:  Today's Vitals   11/11/18 1143  BP: 137/85  Pulse: 70  Temp: 98.1 F (36.7 C)  SpO2: 99%  Weight: 248 lb 12.8 oz (112.9 kg)  Height: 5\' 3"  (1.6 m)   Body mass index is 44.07 kg/m.   Physical Exam Vitals signs and nursing note reviewed.  Constitutional:      Appearance: She is well-developed.  HENT:     Head: Normocephalic and atraumatic.     Mouth/Throat:     Pharynx: No oropharyngeal exudate.  Eyes:     General: No  scleral icterus.    Conjunctiva/sclera: Conjunctivae normal.     Pupils: Pupils are equal, round, and reactive to light.  Neck:     Musculoskeletal: Neck supple.  Cardiovascular:     Rate and Rhythm: Normal rate and regular rhythm.     Heart sounds: Normal heart sounds. No murmur. No friction rub. No gallop.   Pulmonary:  Effort: Pulmonary effort is normal.     Breath sounds: Normal breath sounds. No wheezing or rales.  Skin:    General: Skin is warm and dry.  Neurological:     Mental Status: She is alert and oriented to person, place, and time.     Peak flow reading is 350, about 97 % of predicted.  No results found for this or any previous visit (from the past 24 hour(s)).  No results found.   ASSESSMENT and PLAN  1. Essential hypertension, benign Controlled. Continue current regime.  - Comprehensive metabolic panel  2. Depression with anxiety Not controlled, adding lexapro. Discussed titration. Cont with wellbutrin for now, consider weaning off.  - hydrOXYzine (ATARAX/VISTARIL) 25 MG tablet; TAKE 0.5-1 TABLETS BY MOUTH 3 TIMES DAILY AS NEEDED FOR ANXIETY/SLEEP  3. Moderate persistent asthma without complication Not controlled. Changing flovent to advair. Cont with albuterol prn  4. Hyperlipidemia, unspecified hyperlipidemia type Labs pending, consider starting meds per ASCVD risk - Lipid panel  5. Prediabetes - Hemoglobin A1c  6. OSA on CPAP Continue with nightly use.  7. Screening for colon cancer - Cologuard  8. Visit for screening mammogram - MM Digital Screening; Future  Other orders - Flu Vaccine QUAD 36+ mos IM - escitalopram (LEXAPRO) 20 MG tablet; Take 1 tablet (20 mg total) by mouth at bedtime. - Check Peak Flow - Fluticasone-Salmeterol (ADVAIR DISKUS) 250-50 MCG/DOSE AEPB; Inhale 1 puff into the lungs 2 (two) times daily.  Return in about 2 months (around 01/11/2019) for anxiety and asthma.    Rutherford Guys, MD Primary Care at [facility], [place] [address] Ph.  [phone] Fax [phone]

## 2018-11-11 NOTE — Patient Instructions (Signed)
° ° ° °  If you have lab work done today you will be contacted with your lab results within the next 2 weeks.  If you have not heard from us then please contact us. The fastest way to get your results is to register for My Chart. ° ° °IF you received an x-ray today, you will receive an invoice from [facility]. Please contact [facility] at [phone] with questions or concerns regarding your invoice.  ° °IF you received labwork today, you will receive an invoice from [facility]. Please contact [facility] at [phone] with questions or concerns regarding your invoice.  ° °Our billing staff will not be able to assist you with questions regarding bills from these companies. ° °You will be contacted with the lab results as soon as they are available. The fastest way to get your results is to activate your My Chart account. Instructions are located on the last page of this paperwork. If you have not heard from us regarding the results in 2 weeks, please contact this office. °  ° ° ° °

## 2018-11-12 LAB — COMPREHENSIVE METABOLIC PANEL
ALT: 14 IU/L (ref 0–32)
AST: 16 IU/L (ref 0–40)
Albumin/Globulin Ratio: 1.7 (ref 1.2–2.2)
Albumin: 4.3 g/dL (ref 3.8–4.9)
Alkaline Phosphatase: 83 IU/L (ref 39–117)
BUN/Creatinine Ratio: 9 (ref 9–23)
BUN: 8 mg/dL (ref 6–24)
Bilirubin Total: 0.3 mg/dL (ref 0.0–1.2)
CO2: 24 mmol/L (ref 20–29)
Calcium: 9.5 mg/dL (ref 8.7–10.2)
Chloride: 104 mmol/L (ref 96–106)
Creatinine, Ser: 0.87 mg/dL (ref 0.57–1.00)
GFR calc Af Amer: 89 mL/min/{1.73_m2} (ref >59)
GFR calc non Af Amer: 77 mL/min/{1.73_m2} (ref >59)
Globulin, Total: 2.6 g/dL (ref 1.5–4.5)
Glucose: 96 mg/dL (ref 65–99)
Potassium: 3.7 mmol/L (ref 3.5–5.2)
Sodium: 140 mmol/L (ref 134–144)
Total Protein: 6.9 g/dL (ref 6.0–8.5)

## 2018-11-12 LAB — LIPID PANEL
Chol/HDL Ratio: 5.3 ratio — ABNORMAL HIGH (ref 0.0–4.4)
Cholesterol, Total: 280 mg/dL — ABNORMAL HIGH (ref 100–199)
HDL: 53 mg/dL (ref >39)
LDL Chol Calc (NIH): 191 mg/dL — ABNORMAL HIGH (ref 0–99)
Triglycerides: 192 mg/dL — ABNORMAL HIGH (ref 0–149)
VLDL Cholesterol Cal: 36 mg/dL (ref 5–40)

## 2018-11-12 LAB — HEMOGLOBIN A1C
Est. average glucose Bld gHb Est-mCnc: 137 mg/dL
Hgb A1c MFr Bld: 6.4 % — ABNORMAL HIGH (ref 4.8–5.6)

## 2018-11-24 ENCOUNTER — Other Ambulatory Visit: Payer: Self-pay | Admitting: Family Medicine

## 2018-11-24 MED ORDER — METFORMIN HCL 500 MG PO TABS: 500.0000 mg | ORAL_TABLET | Freq: Every day | ORAL | 1 refills | Status: DC

## 2018-11-24 MED ORDER — ATORVASTATIN CALCIUM 40 MG PO TABS: 40.0000 mg | ORAL_TABLET | Freq: Every day | ORAL | 3 refills | Status: DC

## 2018-11-27 ENCOUNTER — Other Ambulatory Visit: Payer: Self-pay | Admitting: Family Medicine

## 2018-11-27 DIAGNOSIS — I10 Essential (primary) hypertension: Secondary | ICD-10-CM

## 2018-11-28 ENCOUNTER — Other Ambulatory Visit: Payer: Self-pay | Admitting: Family Medicine

## 2018-11-28 NOTE — Telephone Encounter (Signed)
Forwarding medication refill request to the clinical pool for review. 

## 2018-12-03 ENCOUNTER — Other Ambulatory Visit: Payer: Self-pay | Admitting: Family Medicine

## 2018-12-03 NOTE — Telephone Encounter (Signed)
Requested medication (s) are due for refill today: yes  Requested medication (s) are on the active medication list: yes  Last refill:  11/11/2018  Future visit scheduled: yes  Notes to clinic: patient requesting 90 day with 1 refill   Requested Prescriptions  Pending Prescriptions Disp Refills   escitalopram (LEXAPRO) 20 MG tablet [Pharmacy Med Name: ESCITALOPRAM 20 MG TABLET] 90 tablet 1    Sig: TAKE 1 TABLET BY MOUTH EVERYDAY AT BEDTIME     Psychiatry:  Antidepressants - SSRI Failed - 12/03/2018  2:31 PM      Failed - Completed PHQ-2 or PHQ-9 in the last 360 days.      Passed - Valid encounter within last 6 months    Recent Outpatient Visits          3 weeks ago Essential hypertension, benign   [facility], Lilia Argue, MD   7 months ago Essential hypertension   [facility], Ranell Patrick, MD   9 months ago Chest pain, unspecified type   [facility], Ranell Patrick, MD   1 year ago Pre-diabetes   [facility], Lilia Argue, MD   1 year ago Essential hypertension, benign   [facility], Lilia Argue, MD      Future Appointments            In 1 month Rutherford Guys, MD Primary Care at [place], [facility]

## 2018-12-18 DIAGNOSIS — F411 Generalized anxiety disorder: Secondary | ICD-10-CM | POA: Diagnosis not present

## 2018-12-18 DIAGNOSIS — F324 Major depressive disorder, single episode, in partial remission: Secondary | ICD-10-CM | POA: Diagnosis not present

## 2018-12-23 ENCOUNTER — Other Ambulatory Visit: Payer: Self-pay | Admitting: Family Medicine

## 2018-12-23 DIAGNOSIS — I10 Essential (primary) hypertension: Secondary | ICD-10-CM

## 2019-01-05 ENCOUNTER — Other Ambulatory Visit: Payer: Self-pay | Admitting: Family Medicine

## 2019-01-05 DIAGNOSIS — F418 Other specified anxiety disorders: Secondary | ICD-10-CM

## 2019-01-05 NOTE — Telephone Encounter (Signed)
Requested medication (s) are due for refill today: yes  Requested medication (s) are on the active medication list: yes  Last refill:  12/08/2018  Future visit scheduled: yes  Notes to clinic:  Requesting 90 day supply   Requested Prescriptions  Pending Prescriptions Disp Refills   hydrOXYzine (ATARAX/VISTARIL) 25 MG tablet [Pharmacy Med Name: HYDROXYZINE HCL 25 MG TABLET] 270 tablet 2    Sig: TAKE HALF-1 TABLETS BY MOUTH 3 TIMES DAILY AS NEEDED FOR ANXIETY/SLEEP     Ear, Nose, and Throat:  Antihistamines Passed - 01/05/2019  8:31 AM      Passed - Valid encounter within last 12 months    Recent Outpatient Visits          1 month ago Essential hypertension, benign   [facility], Lilia Argue, MD   8 months ago Essential hypertension   [facility], Ranell Patrick, MD   10 months ago Chest pain, unspecified type   [facility], Ranell Patrick, MD   1 year ago Pre-diabetes   [facility], Lilia Argue, MD   1 year ago Essential hypertension, benign   [facility], Lilia Argue, MD      Future Appointments            In 4 days Rutherford Guys, MD Primary Care at [place], [facility]

## 2019-01-05 NOTE — Telephone Encounter (Signed)
Pt is requesting medication 

## 2019-01-08 DIAGNOSIS — F411 Generalized anxiety disorder: Secondary | ICD-10-CM | POA: Diagnosis not present

## 2019-01-08 DIAGNOSIS — F324 Major depressive disorder, single episode, in partial remission: Secondary | ICD-10-CM | POA: Diagnosis not present

## 2019-01-09 ENCOUNTER — Ambulatory Visit: Payer: Federal, State, Local not specified - PPO | Admitting: Family Medicine

## 2019-01-12 ENCOUNTER — Encounter: Payer: Self-pay | Admitting: Family Medicine

## 2019-01-19 ENCOUNTER — Other Ambulatory Visit: Payer: Self-pay | Admitting: Family Medicine

## 2019-01-19 DIAGNOSIS — I10 Essential (primary) hypertension: Secondary | ICD-10-CM

## 2019-01-22 DIAGNOSIS — F411 Generalized anxiety disorder: Secondary | ICD-10-CM | POA: Diagnosis not present

## 2019-01-22 DIAGNOSIS — F324 Major depressive disorder, single episode, in partial remission: Secondary | ICD-10-CM | POA: Diagnosis not present

## 2019-02-05 IMAGING — DX DG CHEST 2V
2 series · 2 of 2 positions shown · non-contrast
Comparison: Radiograph 01/30/2014

CLINICAL DATA: Fever.  Flu-like illness.  Chest pain.

EXAM:
CHEST  2 VIEW

[chest pa]
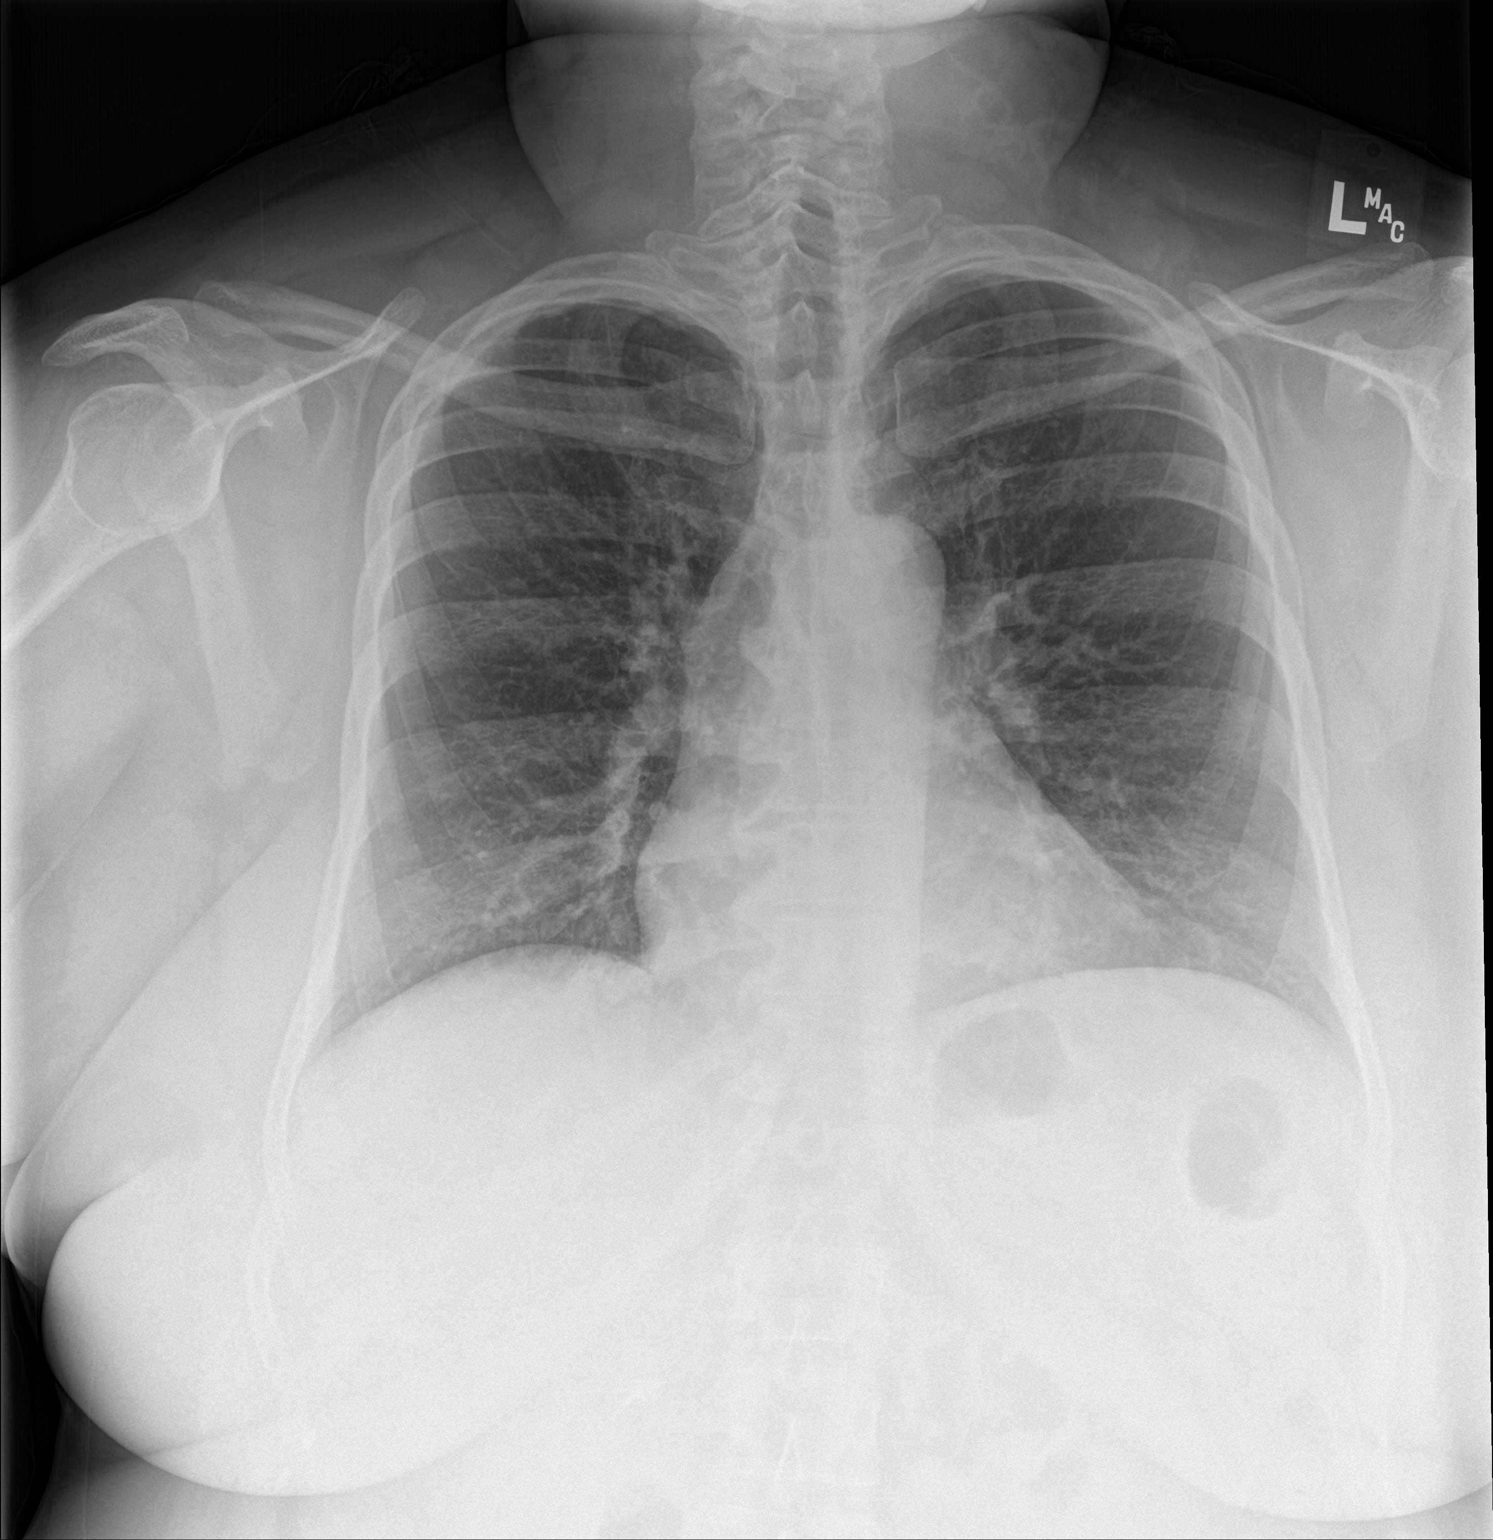

[chest lat]
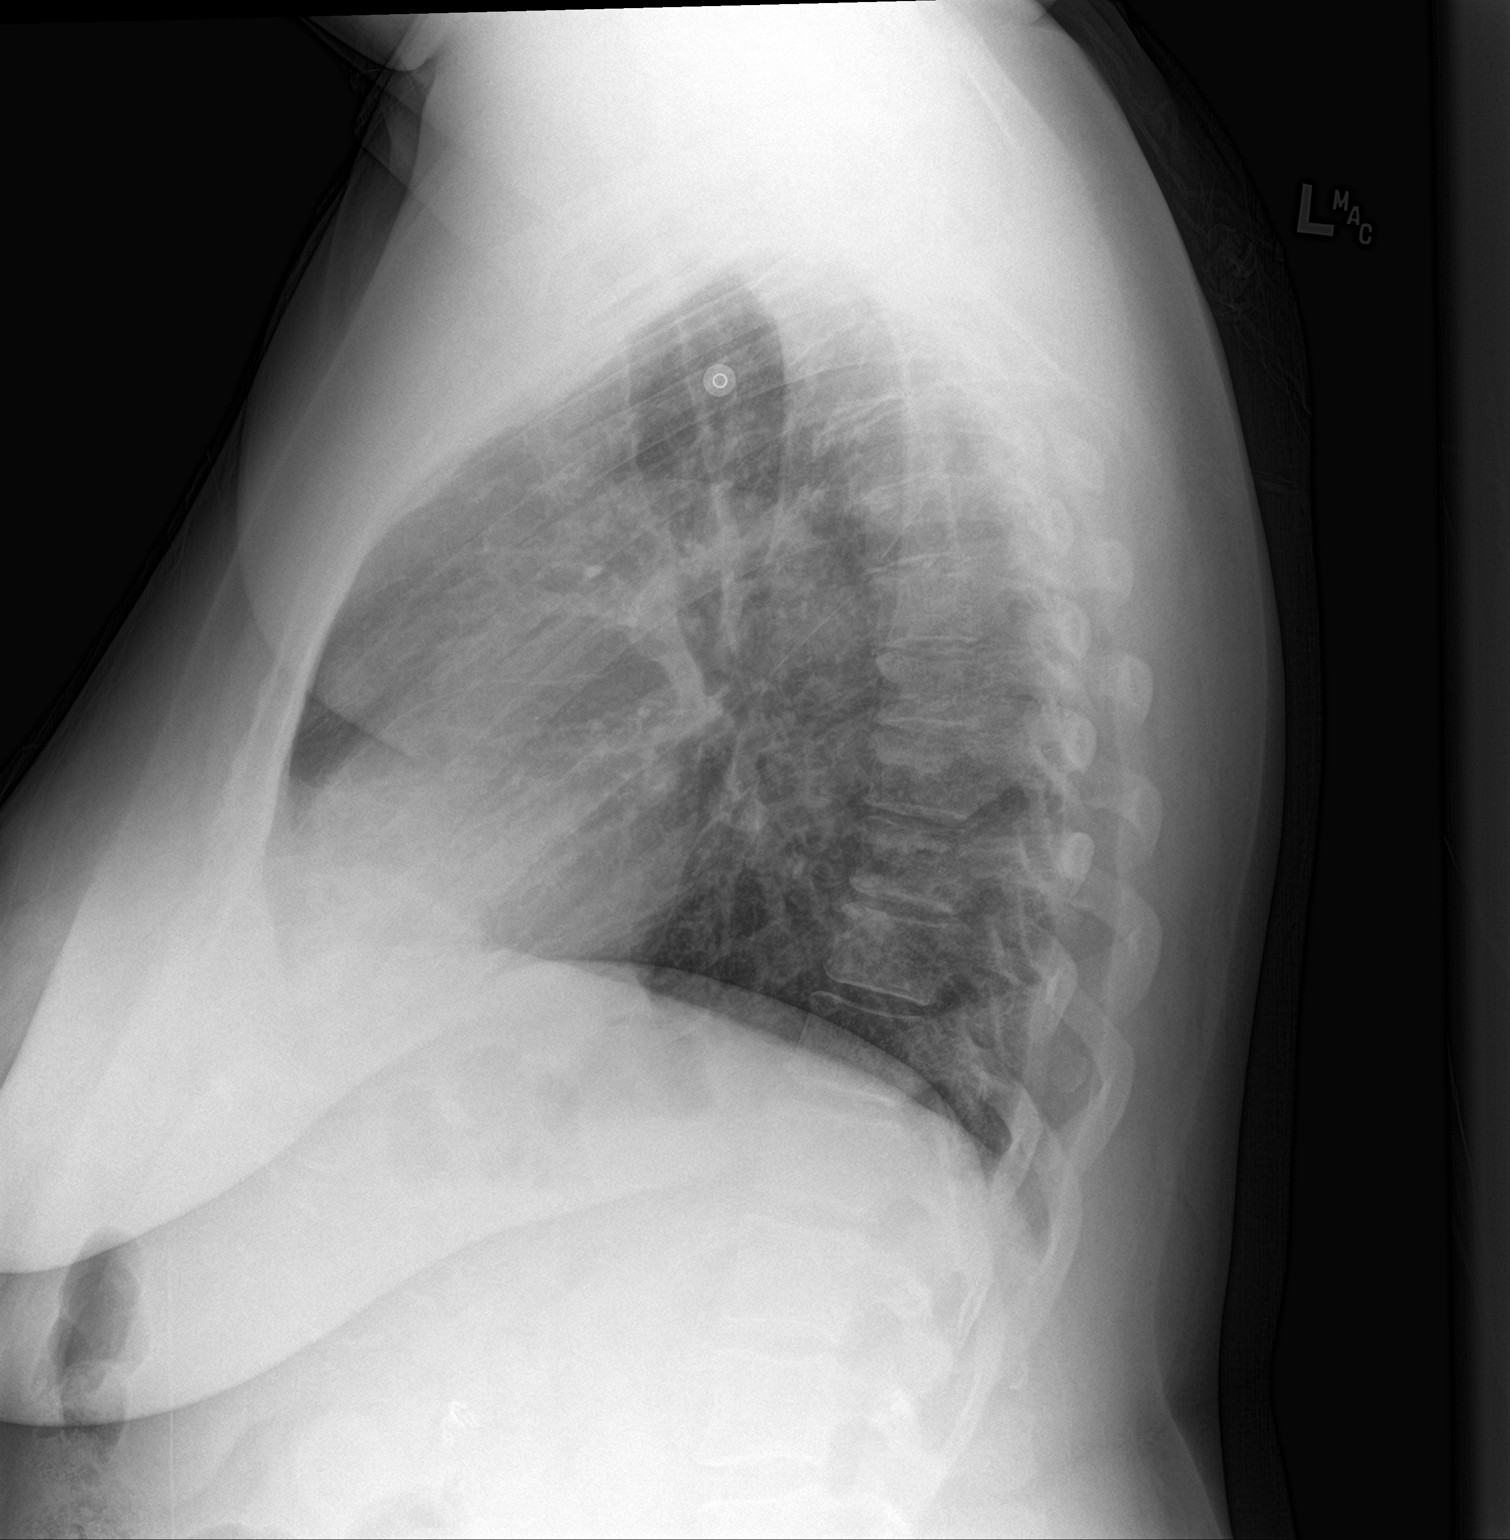

[2 of 2 positions shown; findings below may reference images not displayed]

FINDINGS: Bronchial thickening with slight progression from prior exam. The
cardiomediastinal contours are normal. Pulmonary vasculature is
normal. No consolidation, pleural effusion, or pneumothorax. No
acute osseous abnormalities are seen.
IMPRESSION: Increased bronchial thickening from prior exam, can be seen with
acute bronchitis or asthma exacerbation. No focal airspace disease.

## 2019-02-07 ENCOUNTER — Other Ambulatory Visit: Payer: Self-pay | Admitting: Family Medicine

## 2019-02-12 DIAGNOSIS — F411 Generalized anxiety disorder: Secondary | ICD-10-CM | POA: Diagnosis not present

## 2019-02-12 DIAGNOSIS — F324 Major depressive disorder, single episode, in partial remission: Secondary | ICD-10-CM | POA: Diagnosis not present

## 2019-02-17 ENCOUNTER — Other Ambulatory Visit: Payer: Self-pay | Admitting: Family Medicine

## 2019-02-17 DIAGNOSIS — F411 Generalized anxiety disorder: Secondary | ICD-10-CM

## 2019-03-06 ENCOUNTER — Other Ambulatory Visit: Payer: Self-pay | Admitting: Family Medicine

## 2019-03-06 DIAGNOSIS — I10 Essential (primary) hypertension: Secondary | ICD-10-CM

## 2019-03-06 NOTE — Telephone Encounter (Signed)
Requested medication (s) are due for refill today: yes  Requested medication (s) are on the active medication list: yes  Last refill: 01/19/19  Future visit scheduled: no  Notes to clinic:  Need Dx code    Requested Prescriptions  Pending Prescriptions Disp Refills   lisinopril-hydrochlorothiazide (ZESTORETIC) 10-12.5 MG tablet [Pharmacy Med Name: LISINOPRIL-HCTZ 10-12.5 MG TAB] 90 tablet 0    Sig: TAKE 1 TABLET BY MOUTH EVERY DAY      Cardiovascular:  ACEI + Diuretic Combos Passed - 03/06/2019  4:36 PM      Passed - Na in normal range and within 180 days    Sodium  Date Value Ref Range Status  11/11/2018 140 134 - 144 mmol/L Final          Passed - K in normal range and within 180 days    Potassium  Date Value Ref Range Status  11/11/2018 3.7 3.5 - 5.2 mmol/L Final          Passed - Cr in normal range and within 180 days    Creat  Date Value Ref Range Status  06/05/2011 0.83 0.50 - 1.10 mg/dL Final   Creatinine, Ser  Date Value Ref Range Status  11/11/2018 0.87 0.57 - 1.00 mg/dL Final   Creatinine, Urine  Date Value Ref Range Status  09/21/2010 213.9 mg/dL Final          Passed - Ca in normal range and within 180 days    Calcium  Date Value Ref Range Status  11/11/2018 9.5 8.7 - 10.2 mg/dL Final          Passed - Patient is not pregnant      Passed - Last BP in normal range    BP Readings from Last 1 Encounters:  11/11/18 137/85          Passed - Valid encounter within last 6 months    Recent Outpatient Visits           3 months ago Essential hypertension, benign   [facility], Meda Coffee, MD   10 months ago Essential hypertension   [facility], Asencion Partridge, MD   1 year ago Chest pain, unspecified type   [facility], Asencion Partridge, MD   1 year ago Pre-diabetes   [facility], Meda Coffee, MD   1 year ago Essential hypertension, benign   [facility], Meda Coffee, MD

## 2019-03-09 ENCOUNTER — Other Ambulatory Visit: Payer: Self-pay | Admitting: Family Medicine

## 2019-03-09 DIAGNOSIS — Z1231 Encounter for screening mammogram for malignant neoplasm of breast: Secondary | ICD-10-CM | POA: Diagnosis not present

## 2019-03-09 LAB — HM MAMMOGRAPHY

## 2019-03-12 DIAGNOSIS — F324 Major depressive disorder, single episode, in partial remission: Secondary | ICD-10-CM | POA: Diagnosis not present

## 2019-03-12 DIAGNOSIS — F411 Generalized anxiety disorder: Secondary | ICD-10-CM | POA: Diagnosis not present

## 2019-04-09 DIAGNOSIS — F411 Generalized anxiety disorder: Secondary | ICD-10-CM | POA: Diagnosis not present

## 2019-04-09 DIAGNOSIS — F324 Major depressive disorder, single episode, in partial remission: Secondary | ICD-10-CM | POA: Diagnosis not present

## 2019-04-17 ENCOUNTER — Other Ambulatory Visit: Payer: Self-pay

## 2019-04-17 ENCOUNTER — Ambulatory Visit
Admission: EM | Admit: 2019-04-17 | Discharge: 2019-04-17 | Disposition: A | Discharge status: Home / Self Care | Payer: Federal, State, Local not specified - PPO | Attending: Emergency Medicine | Admitting: Emergency Medicine

## 2019-04-17 DIAGNOSIS — J4520 Mild intermittent asthma, uncomplicated: Secondary | ICD-10-CM | POA: Diagnosis not present

## 2019-04-17 DIAGNOSIS — J302 Other seasonal allergic rhinitis: Secondary | ICD-10-CM

## 2019-04-17 MED ORDER — FLUTICASONE PROPIONATE 50 MCG/ACT NA SUSP: 1.0000 | Freq: Every day | NASAL | 0 refills | Status: DC

## 2019-04-17 MED ORDER — AEROCHAMBER PLUS FLO-VU MEDIUM MISC: 1.0000 | Freq: Once | 0 refills | Status: AC

## 2019-04-17 MED ORDER — MONTELUKAST SODIUM 10 MG PO TABS: 10.0000 mg | ORAL_TABLET | Freq: Every day | ORAL | 0 refills | Status: DC

## 2019-04-17 NOTE — Discharge Instructions (Addendum)
Use inhaler with spacing device. Continue home meds. Follow up with PCP about deciding to get second dose of covid vaccine.

## 2019-04-17 NOTE — ED Triage Notes (Signed)
Pt presents with complaints of concerns for allergic reaction to covid vaccine. States she received her first shot on Monday. Reports nausea all week and sore throat. Concerned that she has some swelling in her face.

## 2019-04-17 NOTE — ED Provider Notes (Signed)
[facility]    CSN: [id number] Arrival date & time:         History   Chief Complaint Chief Complaint  Patient presents with  . Allergic Reaction    HPI [name] is a 54 y.o. female with history of asthma, hypertension, sleep apnea, diabetes, obesity presenting for concern of allergic reaction to Covid vaccine.  States she received her first dose of Moderna on Monday.  States she felt like her throat was dry shortly after.  Has been having some facial swelling she noted this morning.  Denies fever, chills, arthralgias, myalgias, difficulty breathing or swallowing.  Has had used her inhaler more frequently the past few days without significant increase in dyspnea.  Denying chest pain, palpitations, vomiting, rash, diarrhea.   Past Medical History:  Diagnosis Date  . Allergy   . Asthma   . Cushing syndrome ([place])    ?-pt states she was given this diagnosis during a hospitalization.  Records reviewed, no mention of dx or treatment  . Diabetes mellitus   . Heart murmur    mild TR  . Hyperlipidemia   . Hypertension   . Sleep apnea     Patient Active Problem List   Diagnosis Date Noted  . Asthma exacerbation, mild 01/31/2014  . Snoring 05/18/2013  . Accelerated hypertension 05/18/2013  . Essential hypertension, benign 06/05/2011  . Diabetes mellitus without mention of complication 40/34/7425  . Asthma 07/21/2010  . Heart murmur 07/21/2010  . Hyperlipidemia 07/21/2010  . Rhinitis, allergic 07/21/2010    Past Surgical History:  Procedure Laterality Date  . CERVICAL CONIZATION W/BX  1999  . CHOLECYSTECTOMY N/A 01/31/2014   Procedure: LAPAROSCOPIC CHOLECYSTECTOMY ;  Surgeon: Coralie Keens, MD;  Location: [facility];  Service: General;  Laterality: N/A;  . GALLBLADDER SURGERY  Jan 31, 2014  . TUBAL LIGATION  01/10/2000    OB History    Gravida  5   Para  3   Term      Preterm      AB  2   Living  3     SAB  1   TAB  1   Ectopic      Multiple      Live Births               Home Medications    Prior to Admission medications   Medication Sig Start Date End Date Taking? Authorizing Provider  albuterol (PROVENTIL HFA;VENTOLIN HFA) 108 (90 Base) MCG/ACT inhaler INHALE 1-2 PUFFS INTO THE LUNGS EVERY 6 (SIX) HOURS AS NEEDED FOR WHEEZING OR SHORTNESS OF BREATH. 10/31/17  Yes Jaynee Eagles, PA-C  albuterol (VENTOLIN HFA) 108 (90 Base) MCG/ACT inhaler INHALE 1-2 PUFFS INTO THE LUNGS EVERY 6 (SIX) HOURS AS NEEDED FOR WHEEZING OR SHORTNESS OF BREATH. 11/28/18  Yes Rutherford Guys, MD  atorvastatin (LIPITOR) 40 MG tablet Take 1 tablet (40 mg total) by mouth daily. 11/24/18  Yes Rutherford Guys, MD  buPROPion (WELLBUTRIN) 75 MG tablet TAKE 1 TABLET BY MOUTH TWICE A DAY 02/17/19  Yes Rutherford Guys, MD  escitalopram (LEXAPRO) 20 MG tablet TAKE 1 TABLET BY MOUTH EVERYDAY AT BEDTIME 02/08/19  Yes Rutherford Guys, MD  hydrOXYzine (ATARAX/VISTARIL) 25 MG tablet TAKE HALF-1 TABLETS BY MOUTH 3 TIMES DAILY AS NEEDED FOR ANXIETY/SLEEP 01/05/19  Yes Rutherford Guys, MD  lisinopril-hydrochlorothiazide (ZESTORETIC) 10-12.5 MG tablet TAKE 1 TABLET BY MOUTH EVERY DAY 03/09/19  Yes Rutherford Guys, MD  metFORMIN (GLUCOPHAGE) 500  MG tablet Take 1 tablet (500 mg total) by mouth daily with breakfast. 11/24/18  Yes Myles Lipps, MD  [place] INHUB 250-50 MCG/DOSE AEPB TAKE 1 PUFF BY MOUTH TWICE A DAY 03/09/19  Yes Myles Lipps, MD  cetirizine (ZYRTEC) 10 MG tablet Take 1 tablet (10 mg total) by mouth daily. 05/07/17   Wallis Bamberg, PA-C  fluticasone (FLONASE) 50 MCG/ACT nasal spray Place 1 spray into both nostrils daily. 04/17/19   Hall-Potvin, [place], PA-C  montelukast (SINGULAIR) 10 MG tablet Take 1 tablet (10 mg total) by mouth at bedtime. 04/17/19   Hall-Potvin, [place], PA-C  Spacer/Aero-Holding Chambers (AEROCHAMBER PLUS FLO-VU MEDIUM) MISC 1 each by Other route once for 1 dose. 04/17/19 04/17/19  Hall-Potvin, [place], PA-C  triamcinolone cream  (KENALOG) 0.1 % Apply 1 application topically 2 (two) times daily. 10/11/18   Belinda Fisher, PA-C    Family History Family History  Problem Relation Age of Onset  . Thyroid disease Sister   . Hyperlipidemia Mother   . Heart murmur Daughter   . Heart murmur Son   . Heart murmur Daughter   . Asthma Daughter   . Hypertension Father   . Cancer Cousin 23       female cousin--unknown primary, metastatic  . Pulmonary embolism Daughter 57  . Diabetes Neg Hx     Social History Social History   Tobacco Use  . Smoking status: Former Smoker    Packs/day: 0.25    Types: Cigarettes    Quit date: 02/21/2012    Years since quitting: 7.1  . Smokeless tobacco: Never Used  Substance Use Topics  . Alcohol use: No    Alcohol/week: 0.0 standard drinks  . Drug use: No     Allergies   Amlodipine benzoate, Bactrim [sulfamethoxazole-trimethoprim], Doxycycline, and Rocephin [ceftriaxone sodium]   Review of Systems As per HPI   Physical Exam Triage Vital Signs ED Triage Vitals  Enc Vitals Group     BP      Pulse      Resp      Temp      Temp src      SpO2      Weight      Height      Head Circumference      Peak Flow      Pain Score      Pain Loc      Pain Edu?      Excl. in GC?    No data found.  Updated Vital Signs BP 122/82   Pulse 77   Temp 98.5 F (36.9 C)   Resp 18   SpO2 96%   Visual Acuity Right Eye Distance:   Left Eye Distance:   Bilateral Distance:    Right Eye Near:   Left Eye Near:    Bilateral Near:     Physical Exam Constitutional:      General: She is not in acute distress.    Appearance: She is obese. She is not ill-appearing or diaphoretic.  HENT:     Head: Normocephalic and atraumatic.     Comments: No appreciable facial swelling    Right Ear: Tympanic membrane, ear canal and external ear normal.     Left Ear: Tympanic membrane, ear canal and external ear normal.     Nose:     Comments: Bilateral turbinate edema with mucosal pallor.   Negative sinus tenderness bilaterally.  Positive allergic salute    Mouth/Throat:     Mouth:  Mucous membranes are moist.     Pharynx: Oropharynx is clear. No posterior oropharyngeal erythema.     Comments: Uvula midline, nonedematous Eyes:     General: No scleral icterus.    Extraocular Movements: Extraocular movements intact.     Conjunctiva/sclera: Conjunctivae normal.     Pupils: Pupils are equal, round, and reactive to light.     Comments: No periorbital edema, erythema, ecchymosis  Neck:     Comments: Mild bilateral cervical LAD.  Difficulty appreciate second habitus.  No thyromegaly appreciated Cardiovascular:     Rate and Rhythm: Normal rate and regular rhythm.     Heart sounds: No murmur. No gallop.   Pulmonary:     Effort: Pulmonary effort is normal. No respiratory distress.     Breath sounds: No stridor. Wheezing present. No rhonchi or rales.     Comments: Good air entry bilaterally without prolonged expiratory phase.  Mild end phase expiratory wheezing noted Musculoskeletal:     Cervical back: Normal range of motion and neck supple. Tenderness present.  Lymphadenopathy:     Cervical: Cervical adenopathy present.  Skin:    Capillary Refill: Capillary refill takes less than 2 seconds.     Coloration: Skin is not jaundiced or pale.  Neurological:     General: No focal deficit present.     Mental Status: She is alert and oriented to person, place, and time.      UC Treatments / Results  Labs (all labs ordered are listed, but only abnormal results are displayed) Labs Reviewed - No data to display  EKG   Radiology No results found.  Procedures Procedures (including critical care time)  Medications Ordered in UC Medications - No data to display  Initial Impression / Assessment and Plan / UC Course  I have reviewed the triage vital signs and the nursing notes.  Pertinent labs & imaging results that were available during my care of the patient were reviewed by  me and considered in my medical decision making (see chart for details).     Patient afebrile, nontoxic, and without signs of respiratory distress or airway compromise.  Low concern for post vaccination allergic reaction given duration since first dose and stability and symptoms since development.  Likely allergic rhinitis second to environmental allergies.  Patient does have trace wheezing: We will defer prednisone as patient is 1 week s/p first dose of Covid vaccine.  Will send for AeroChamber for patient to use with albuterol inhaler for increased administration of medication.  Return precautions discussed, patient verbalized understanding and is agreeable to plan. Final Clinical Impressions(s) / UC Diagnoses   Final diagnoses:  Seasonal allergies  Mild intermittent asthma without complication     Discharge Instructions     Use inhaler with spacing device. Continue home meds. Follow up with PCP about deciding to get second dose of covid vaccine.    ED Prescriptions    Medication Sig Dispense Auth. Provider   montelukast (SINGULAIR) 10 MG tablet Take 1 tablet (10 mg total) by mouth at bedtime. 30 tablet Hall-Potvin, [place], PA-C   fluticasone (FLONASE) 50 MCG/ACT nasal spray Place 1 spray into both nostrils daily. 16 g Hall-Potvin, [place], PA-C   Spacer/Aero-Holding Chambers (AEROCHAMBER PLUS FLO-VU MEDIUM) MISC 1 each by Other route once for 1 dose. 1 each Hall-Potvin, [place], PA-C     PDMP not reviewed this encounter.   Hall-Potvin, [place], [place] 04/17/19 1523

## 2019-04-30 ENCOUNTER — Other Ambulatory Visit: Payer: Self-pay | Admitting: Family Medicine

## 2019-04-30 NOTE — Telephone Encounter (Signed)
Requested Prescriptions  Pending Prescriptions Disp Refills  . albuterol (VENTOLIN HFA) 108 (90 Base) MCG/ACT inhaler [Pharmacy Med Name: ALBUTEROL HFA (PROAIR) INHALER] 8.5 g 0    Sig: INHALE 1-2 PUFFS INTO THE LUNGS EVERY 6 (SIX) HOURS AS NEEDED FOR WHEEZING OR SHORTNESS OF BREATH.     Pulmonology:  Beta Agonists Failed - 04/30/2019  4:04 PM      Failed - One inhaler should last at least one month. If the patient is requesting refills earlier, contact the patient to check for uncontrolled symptoms.      Passed - Valid encounter within last 12 months    Recent Outpatient Visits          5 months ago Essential hypertension, benign   [facility], Meda Coffee, MD   12 months ago Essential hypertension   [facility], Asencion Partridge, MD   1 year ago Chest pain, unspecified type   [facility], Asencion Partridge, MD   1 year ago Pre-diabetes   [facility], Meda Coffee, MD   1 year ago Essential hypertension, benign   [facility], Meda Coffee, MD      Future Appointments            In 1 week Myles Lipps, MD [facility] [place], [facility]           pt. Contacted and scheduled for f/u appt. Gave courtesy refill, until follows up in the office.

## 2019-05-13 ENCOUNTER — Ambulatory Visit: Payer: Federal, State, Local not specified - PPO | Admitting: Family Medicine

## 2019-05-13 ENCOUNTER — Encounter: Payer: Self-pay | Admitting: Family Medicine

## 2019-05-13 ENCOUNTER — Other Ambulatory Visit: Payer: Self-pay

## 2019-05-21 DIAGNOSIS — F411 Generalized anxiety disorder: Secondary | ICD-10-CM | POA: Diagnosis not present

## 2019-05-21 DIAGNOSIS — F324 Major depressive disorder, single episode, in partial remission: Secondary | ICD-10-CM | POA: Diagnosis not present

## 2019-05-23 ENCOUNTER — Other Ambulatory Visit: Payer: Self-pay | Admitting: Family Medicine

## 2019-05-23 NOTE — Telephone Encounter (Signed)
Requested medication (s) are due for refill today: yes  Requested medication (s) are on the active medication list: yes  Last refill:  04/30/18  Future visit scheduled: yes  Notes to clinic:  Pt with upcoming appt- Last OV > 3 month overdue-   Requested Prescriptions  Pending Prescriptions Disp Refills   albuterol (VENTOLIN HFA) 108 (90 Base) MCG/ACT inhaler [Pharmacy Med Name: ALBUTEROL HFA (PROAIR) INHALER]      Sig: INHALE 1-2 PUFFS INTO THE LUNGS EVERY 6 (SIX) HOURS AS NEEDED FOR WHEEZING OR SHORTNESS OF BREATH.      Pulmonology:  Beta Agonists Failed - 05/23/2019  9:40 AM      Failed - One inhaler should last at least one month. If the patient is requesting refills earlier, contact the patient to check for uncontrolled symptoms.      Passed - Valid encounter within last 12 months    Recent Outpatient Visits           6 months ago Essential hypertension, benign   [facility], Meda Coffee, MD   1 year ago Essential hypertension   [facility], Asencion Partridge, MD   1 year ago Chest pain, unspecified type   [facility], Asencion Partridge, MD   1 year ago Pre-diabetes   [facility], Meda Coffee, MD   1 year ago Essential hypertension, benign   [facility], Meda Coffee, MD       Future Appointments             In 1 week Myles Lipps, MD [facility] at [place], [facility]

## 2019-05-24 ENCOUNTER — Other Ambulatory Visit: Payer: Self-pay | Admitting: Family Medicine

## 2019-05-24 NOTE — Telephone Encounter (Signed)
Pt < 3 months overdue for appt. Pt with upcoming appt 06/02/19. Refilled for 90 days. Requested Prescriptions  Pending Prescriptions Disp Refills  . escitalopram (LEXAPRO) 20 MG tablet [Pharmacy Med Name: ESCITALOPRAM 20 MG TABLET] 90 tablet 0    Sig: TAKE 1 TABLET BY MOUTH EVERYDAY AT BEDTIME     Psychiatry:  Antidepressants - SSRI Failed - 05/24/2019  9:47 AM      Failed - Valid encounter within last 6 months    Recent Outpatient Visits          6 months ago Essential hypertension, benign   [facility], Meda Coffee, MD   1 year ago Essential hypertension   [facility], Asencion Partridge, MD   1 year ago Chest pain, unspecified type   [facility], Asencion Partridge, MD   1 year ago Pre-diabetes   [facility], Meda Coffee, MD   1 year ago Essential hypertension, benign   [facility], Meda Coffee, MD      Future Appointments            In 1 week Myles Lipps, MD [facility] at [place], [facility]

## 2019-05-29 ENCOUNTER — Other Ambulatory Visit: Payer: Self-pay | Admitting: Family Medicine

## 2019-06-02 ENCOUNTER — Ambulatory Visit: Payer: Federal, State, Local not specified - PPO | Admitting: Family Medicine

## 2019-06-02 ENCOUNTER — Encounter: Payer: Self-pay | Admitting: Family Medicine

## 2019-06-02 ENCOUNTER — Other Ambulatory Visit: Payer: Self-pay

## 2019-06-02 VITALS — BP 129/81 | HR 77 | Temp 98.6°F | Ht 63.0 in | Wt 258.0 lb

## 2019-06-02 DIAGNOSIS — E785 Hyperlipidemia, unspecified: Secondary | ICD-10-CM

## 2019-06-02 DIAGNOSIS — F418 Other specified anxiety disorders: Secondary | ICD-10-CM

## 2019-06-02 DIAGNOSIS — I10 Essential (primary) hypertension: Secondary | ICD-10-CM

## 2019-06-02 DIAGNOSIS — R7303 Prediabetes: Secondary | ICD-10-CM | POA: Diagnosis not present

## 2019-06-02 DIAGNOSIS — J301 Allergic rhinitis due to pollen: Secondary | ICD-10-CM

## 2019-06-02 DIAGNOSIS — R599 Enlarged lymph nodes, unspecified: Secondary | ICD-10-CM

## 2019-06-02 DIAGNOSIS — E876 Hypokalemia: Secondary | ICD-10-CM | POA: Diagnosis not present

## 2019-06-02 DIAGNOSIS — J4540 Moderate persistent asthma, uncomplicated: Secondary | ICD-10-CM | POA: Insufficient documentation

## 2019-06-02 MED ORDER — MONTELUKAST SODIUM 10 MG PO TABS: 10.0000 mg | ORAL_TABLET | Freq: Every day | ORAL | 3 refills | Status: DC

## 2019-06-02 MED ORDER — FLUTICASONE-SALMETEROL 250-50 MCG/DOSE IN AEPB: INHALATION_SPRAY | RESPIRATORY_TRACT | 3 refills | Status: DC

## 2019-06-02 MED ORDER — ESCITALOPRAM OXALATE 20 MG PO TABS: ORAL_TABLET | ORAL | 3 refills | Status: DC

## 2019-06-02 NOTE — Progress Notes (Signed)
4/27/20211:25 PM  [name] 01-10-1966, 54 y.o., female [id number]  Chief Complaint  Patient presents with  . left side ear pain and throat pain  . lymph node swelling    3/8 , 4/5    HPI:   Patient is a 54 y.o. female with past medical history significant for HTN, prediabtes, asthma, HLP, OSA on cpap  who presents today for routine followup  Last OV oct 2020 - added lexapro, changed flovent to advair, started metformin and atorvastatin  Seen in ER march 2021 with concerns for allergic rxn to moderna #1 - low suspicious, favored seasonal allergies worsening asthma, started singulair  She reports that things "are much better" Anxiety sign improved on lexapro  She got second covid moderna on April 5th - had sign side effects Just now getting back to normal, getting her energy back Still has swollen left sided cervical LN and ear pressure  She has resumed regular cpap now that congestion is better  Asthma well controlled on wixella  Did not need to use albuterol until covid vaccine She was started on singulair by ER - she would like to continue it as her allergies are much better  Tolerating metformin and atorvastatin Has been eating lots of oranges (about 5-5 halos a day) instead of eating chips and ice cream She is also going to start walking now that she is feeling better  Lab Results  Component Value Date   HGBA1C 6.4 (H) 11/11/2018   HGBA1C CANCELED 08/24/2017   HGBA1C 6.1 (H) 02/08/2017   Lab Results  Component Value Date   MICROALBUR 1.60 09/21/2010   LDLCALC 191 (H) 11/11/2018   CREATININE 0.87 11/11/2018    Depression screen PHQ 2/9 06/02/2019 05/02/2018 02/19/2018  Decreased Interest 0 1 1  Down, Depressed, Hopeless 0 1 1  PHQ - 2 Score 0 2 2  Altered sleeping - 1 1  Tired, decreased energy - 3 0  Change in appetite - 1 0  Feeling bad or failure about yourself  - 0 0  Trouble concentrating - 1 0  Moving slowly or fidgety/restless - 0 0  Suicidal  thoughts - 0 0  PHQ-9 Score - 8 3  Difficult doing work/chores - Not difficult at all -    Fall Risk  06/02/2019 05/02/2018 02/19/2018 08/24/2017 05/07/2017  Falls in the past year? 0 0 0 No No  Number falls in past yr: 0 0 0 - -  Injury with Fall? 0 0 0 - -  Follow up Falls evaluation completed - - - -     Allergies  Allergen Reactions  . Amlodipine Benzoate     Dizziness, light headness, nausea  . Bactrim [Sulfamethoxazole-Trimethoprim]     Lip swelling, mouth itching  . Doxycycline     Severe headache  . Rocephin [Ceftriaxone Sodium] Hives, Itching and Other (See Comments)    wheezing    Prior to Admission medications   Medication Sig Start Date End Date Taking? Authorizing Provider  albuterol (VENTOLIN HFA) 108 (90 Base) MCG/ACT inhaler INHALE 1-2 PUFFS INTO THE LUNGS EVERY 6 (SIX) HOURS AS NEEDED FOR WHEEZING OR SHORTNESS OF BREATH. 05/25/19  Yes Rutherford Guys, MD  atorvastatin (LIPITOR) 40 MG tablet Take 1 tablet (40 mg total) by mouth daily. 11/24/18  Yes Rutherford Guys, MD  buPROPion (WELLBUTRIN) 75 MG tablet TAKE 1 TABLET BY MOUTH TWICE A DAY 02/17/19  Yes Rutherford Guys, MD  escitalopram (LEXAPRO) 20 MG tablet TAKE 1 TABLET BY  MOUTH EVERYDAY AT BEDTIME 05/24/19  Yes Rutherford Guys, MD  fluticasone [facility]) 50 MCG/ACT nasal spray Place 1 spray into both nostrils daily. 04/17/19  Yes Hall-Potvin, [place], PA-C  hydrOXYzine (ATARAX/VISTARIL) 25 MG tablet TAKE HALF-1 TABLETS BY MOUTH 3 TIMES DAILY AS NEEDED FOR ANXIETY/SLEEP 01/05/19  Yes Rutherford Guys, MD  lisinopril-hydrochlorothiazide (ZESTORETIC) 10-12.5 MG tablet TAKE 1 TABLET BY MOUTH EVERY DAY 03/09/19  Yes Rutherford Guys, MD  metFORMIN (GLUCOPHAGE) 500 MG tablet TAKE 1 TABLET BY MOUTH EVERY DAY WITH BREAKFAST 05/29/19  Yes Rutherford Guys, MD  WIXELA INHUB 250-50 MCG/DOSE AEPB TAKE 1 PUFF BY MOUTH TWICE A DAY 03/09/19  Yes Rutherford Guys, MD  albuterol (PROVENTIL HFA;VENTOLIN HFA) 108 (90 Base) MCG/ACT inhaler  INHALE 1-2 PUFFS INTO THE LUNGS EVERY 6 (SIX) HOURS AS NEEDED FOR WHEEZING OR SHORTNESS OF BREATH. Patient not taking: Reported on 06/02/2019 10/31/17   Jaynee Eagles, PA-C  cetirizine (ZYRTEC) 10 MG tablet Take 1 tablet (10 mg total) by mouth daily. Patient not taking: Reported on 06/02/2019 05/07/17   Jaynee Eagles, PA-C  montelukast (SINGULAIR) 10 MG tablet Take 1 tablet (10 mg total) by mouth at bedtime. Patient not taking: Reported on 06/02/2019 04/17/19   Hall-Potvin, [place], PA-C  triamcinolone cream (KENALOG) 0.1 % Apply 1 application topically 2 (two) times daily. Patient not taking: Reported on 06/02/2019 10/11/18   Arturo Morton    Past Medical History:  Diagnosis Date  . Allergy   . Asthma   . Cushing syndrome ([place])    ?-pt states she was given this diagnosis during a hospitalization.  Records reviewed, no mention of dx or treatment  . Diabetes mellitus   . Heart murmur    mild TR  . Hyperlipidemia   . Hypertension   . Sleep apnea     Past Surgical History:  Procedure Laterality Date  . CERVICAL CONIZATION W/BX  1999  . CHOLECYSTECTOMY N/A 01/31/2014   Procedure: LAPAROSCOPIC CHOLECYSTECTOMY ;  Surgeon: Coralie Keens, MD;  Location: [facility];  Service: General;  Laterality: N/A;  . GALLBLADDER SURGERY  Jan 31, 2014  . TUBAL LIGATION  01/10/2000    Social History   Tobacco Use  . Smoking status: Former Smoker    Packs/day: 0.25    Types: Cigarettes    Quit date: 02/21/2012    Years since quitting: 7.2  . Smokeless tobacco: Never Used  Substance Use Topics  . Alcohol use: No    Alcohol/week: 0.0 standard drinks    Family History  Problem Relation Age of Onset  . Thyroid disease Sister   . Hyperlipidemia Mother   . Heart murmur Daughter   . Heart murmur Son   . Heart murmur Daughter   . Asthma Daughter   . Hypertension Father   . Cancer Cousin 34       female cousin--unknown primary, metastatic  . Pulmonary embolism Daughter 87  . Diabetes Neg Hx     Review  of Systems  Constitutional: Negative for chills and fever.  Respiratory: Negative for cough and shortness of breath.   Cardiovascular: Negative for chest pain, palpitations and leg swelling.  Gastrointestinal: Negative for abdominal pain, nausea and vomiting.  per hpi   OBJECTIVE:  Today's Vitals   06/02/19 1323  BP: 129/81  Pulse: 77  Temp: 98.6 F (37 C)  SpO2: 97%  Weight: 258 lb (117 kg)  Height: '5\' 3"'  (1.6 m)   Body mass index is 45.7 kg/m.  Wt Readings from Last 3 Encounters:  06/02/19 258 lb (117 kg)  11/11/18 248 lb 12.8 oz (112.9 kg)  02/19/18 251 lb (113.9 kg)     Physical Exam Vitals and nursing note reviewed.  Constitutional:      Appearance: She is well-developed.  HENT:     Head: Normocephalic and atraumatic.     Right Ear: Hearing, tympanic membrane, ear canal and external ear normal.     Left Ear: Hearing, tympanic membrane, ear canal and external ear normal.     Mouth/Throat:     Mouth: Mucous membranes are moist.     Pharynx: Uvula midline. Pharyngeal swelling present. No oropharyngeal exudate, posterior oropharyngeal erythema or uvula swelling.  Eyes:     Extraocular Movements: Extraocular movements intact.     Conjunctiva/sclera: Conjunctivae normal.     Pupils: Pupils are equal, round, and reactive to light.  Cardiovascular:     Rate and Rhythm: Normal rate and regular rhythm.     Heart sounds: Normal heart sounds. No murmur. No friction rub. No gallop.   Pulmonary:     Effort: Pulmonary effort is normal.     Breath sounds: Normal breath sounds. No wheezing, rhonchi or rales.  Musculoskeletal:     Cervical back: Neck supple.  Lymphadenopathy:     Cervical: Cervical adenopathy (diffuse, most prominent left ant neck) present.  Skin:    General: Skin is warm and dry.  Neurological:     Mental Status: She is alert and oriented to person, place, and time.     No results found for this or any previous visit (from the past 24  hour(s)).  No results found.   ASSESSMENT and PLAN  1. Essential hypertension, benign Controlled. Continue current regime.   2. Hypokalemia - CMP14+EGFR  3. Prediabetes Checking labs today, medications will be adjusted as needed. Cont working on LFM - Hemoglobin A1c  4. Hyperlipidemia, unspecified hyperlipidemia type Checking labs today, medications will be adjusted as needed. Cont working on [employer] - Lipid panel  5. Swollen lymph nodes Presumed to be reactive s/p covid vaccine. Labs pending. RTC precautions reviewed. - CBC with Differential/Platelet  6. Moderate persistent asthma without complication Controlled. Continue current regime.   7. Depression with anxiety Controlled. Continue current regime.   8. Seasonal allergic rhinitis due to pollen Controlled. Continue current regime.   Other orders - Fluticasone-Salmeterol (WIXELA INHUB) 250-50 MCG/DOSE AEPB; TAKE 1 PUFF BY MOUTH TWICE A DAY - montelukast (SINGULAIR) 10 MG tablet; Take 1 tablet (10 mg total) by mouth at bedtime. - escitalopram (LEXAPRO) 20 MG tablet; TAKE 1 TABLET BY MOUTH EVERYDAY AT BEDTIME  Return in about 6 months (around 12/02/2019).    Rutherford Guys, MD Primary [facility] at [facility] [place], [place] [address] Ph.  [phone] Fax [phone]

## 2019-06-02 NOTE — Patient Instructions (Signed)
° ° ° °  If you have lab work done today you will be contacted with your lab results within the next 2 weeks.  If you have not heard from us then please contact us. The fastest way to get your results is to register for My Chart. ° ° °IF you received an x-ray today, you will receive an invoice from [facility]. Please contact [facility] at [phone] with questions or concerns regarding your invoice.  ° °IF you received labwork today, you will receive an invoice from [facility]. Please contact [facility] at [phone] with questions or concerns regarding your invoice.  ° °Our billing staff will not be able to assist you with questions regarding bills from these companies. ° °You will be contacted with the lab results as soon as they are available. The fastest way to get your results is to activate your My Chart account. Instructions are located on the last page of this paperwork. If you have not heard from us regarding the results in 2 weeks, please contact this office. °  ° ° ° °

## 2019-06-03 LAB — CBC WITH DIFFERENTIAL/PLATELET
Basophils Absolute: 0 10*3/uL (ref 0.0–0.2)
Basos: 1 %
EOS (ABSOLUTE): 0.3 10*3/uL (ref 0.0–0.4)
Eos: 4 %
Hematocrit: 35.3 % (ref 34.0–46.6)
Hemoglobin: 11.5 g/dL (ref 11.1–15.9)
Immature Grans (Abs): 0 10*3/uL (ref 0.0–0.1)
Immature Granulocytes: 0 %
Lymphocytes Absolute: 2.6 10*3/uL (ref 0.7–3.1)
Lymphs: 36 %
MCH: 23.9 pg — ABNORMAL LOW (ref 26.6–33.0)
MCHC: 32.6 g/dL (ref 31.5–35.7)
MCV: 73 fL — ABNORMAL LOW (ref 79–97)
Monocytes Absolute: 0.5 10*3/uL (ref 0.1–0.9)
Monocytes: 7 %
Neutrophils Absolute: 3.7 10*3/uL (ref 1.4–7.0)
Neutrophils: 52 %
Platelets: 334 10*3/uL (ref 150–450)
RBC: 4.81 x10E6/uL (ref 3.77–5.28)
RDW: 15.2 % (ref 11.7–15.4)
WBC: 7.1 10*3/uL (ref 3.4–10.8)

## 2019-06-03 LAB — CMP14+EGFR
ALT: 11 IU/L (ref 0–32)
AST: 17 IU/L (ref 0–40)
Albumin/Globulin Ratio: 1.5 (ref 1.2–2.2)
Albumin: 4.2 g/dL (ref 3.8–4.9)
Alkaline Phosphatase: 103 IU/L (ref 39–117)
BUN/Creatinine Ratio: 13 (ref 9–23)
BUN: 12 mg/dL (ref 6–24)
Bilirubin Total: 0.3 mg/dL (ref 0.0–1.2)
CO2: 24 mmol/L (ref 20–29)
Calcium: 9.3 mg/dL (ref 8.7–10.2)
Chloride: 103 mmol/L (ref 96–106)
Creatinine, Ser: 0.92 mg/dL (ref 0.57–1.00)
GFR calc Af Amer: 83 mL/min/{1.73_m2} (ref >59)
GFR calc non Af Amer: 72 mL/min/{1.73_m2} (ref >59)
Globulin, Total: 2.8 g/dL (ref 1.5–4.5)
Glucose: 92 mg/dL (ref 65–99)
Potassium: 4.1 mmol/L (ref 3.5–5.2)
Sodium: 140 mmol/L (ref 134–144)
Total Protein: 7 g/dL (ref 6.0–8.5)

## 2019-06-03 LAB — LIPID PANEL
Chol/HDL Ratio: 3.4 ratio (ref 0.0–4.4)
Cholesterol, Total: 187 mg/dL (ref 100–199)
HDL: 55 mg/dL (ref >39)
LDL Chol Calc (NIH): 96 mg/dL (ref 0–99)
Triglycerides: 211 mg/dL — ABNORMAL HIGH (ref 0–149)
VLDL Cholesterol Cal: 36 mg/dL (ref 5–40)

## 2019-06-03 LAB — HEMOGLOBIN A1C
Est. average glucose Bld gHb Est-mCnc: 140 mg/dL
Hgb A1c MFr Bld: 6.5 % — ABNORMAL HIGH (ref 4.8–5.6)

## 2019-06-19 ENCOUNTER — Other Ambulatory Visit: Payer: Self-pay | Admitting: Family Medicine

## 2019-06-19 DIAGNOSIS — I10 Essential (primary) hypertension: Secondary | ICD-10-CM

## 2019-06-19 NOTE — Telephone Encounter (Signed)
Requested Prescriptions  Pending Prescriptions Disp Refills  . lisinopril-hydrochlorothiazide (ZESTORETIC) 10-12.5 MG tablet [Pharmacy Med Name: LISINOPRIL-HCTZ 10-12.5 MG TAB] 90 tablet 0    Sig: TAKE 1 TABLET BY MOUTH EVERY DAY     Cardiovascular:  ACEI + Diuretic Combos Passed - 06/19/2019  1:22 AM      Passed - Na in normal range and within 180 days    Sodium  Date Value Ref Range Status  06/02/2019 140 134 - 144 mmol/L Final         Passed - K in normal range and within 180 days    Potassium  Date Value Ref Range Status  06/02/2019 4.1 3.5 - 5.2 mmol/L Final         Passed - Cr in normal range and within 180 days    Creat  Date Value Ref Range Status  06/05/2011 0.83 0.50 - 1.10 mg/dL Final   Creatinine, Ser  Date Value Ref Range Status  06/02/2019 0.92 0.57 - 1.00 mg/dL Final   Creatinine, Urine  Date Value Ref Range Status  09/21/2010 213.9 mg/dL Final         Passed - Ca in normal range and within 180 days    Calcium  Date Value Ref Range Status  06/02/2019 9.3 8.7 - 10.2 mg/dL Final         Passed - Patient is not pregnant      Passed - Last BP in normal range    BP Readings from Last 1 Encounters:  06/02/19 129/81         Passed - Valid encounter within last 6 months    Recent Outpatient Visits          2 weeks ago Essential hypertension, benign   [facility], Meda Coffee, MD   7 months ago Essential hypertension, benign   [facility], Meda Coffee, MD   1 year ago Essential hypertension   [facility], Asencion Partridge, MD   1 year ago Chest pain, unspecified type   [facility], Asencion Partridge, MD   1 year ago Pre-diabetes   [facility], Meda Coffee, MD      Future Appointments            In 5 months Myles Lipps, MD [facility] at [place], [facility]

## 2019-06-29 ENCOUNTER — Other Ambulatory Visit: Payer: Self-pay

## 2019-06-29 ENCOUNTER — Encounter: Payer: Self-pay | Admitting: Emergency Medicine

## 2019-06-29 ENCOUNTER — Ambulatory Visit: Payer: Federal, State, Local not specified - PPO | Admitting: Emergency Medicine

## 2019-06-29 VITALS — BP 132/79 | HR 111 | Temp 99.5°F | Resp 16 | Ht 64.0 in | Wt 260.0 lb

## 2019-06-29 DIAGNOSIS — Z8679 Personal history of other diseases of the circulatory system: Secondary | ICD-10-CM

## 2019-06-29 DIAGNOSIS — Z8639 Personal history of other endocrine, nutritional and metabolic disease: Secondary | ICD-10-CM

## 2019-06-29 DIAGNOSIS — N390 Urinary tract infection, site not specified: Secondary | ICD-10-CM | POA: Diagnosis not present

## 2019-06-29 DIAGNOSIS — R300 Dysuria: Secondary | ICD-10-CM | POA: Diagnosis not present

## 2019-06-29 LAB — POCT URINALYSIS DIP (MANUAL ENTRY)
Bilirubin, UA: NEGATIVE
Glucose, UA: 100 mg/dL — AB
Ketones, POC UA: NEGATIVE mg/dL
Nitrite, UA: POSITIVE — AB
Protein Ur, POC: 30 mg/dL — AB
Spec Grav, UA: 1.02 (ref 1.010–1.025)
Urobilinogen, UA: 2 E.U./dL — AB
pH, UA: 7 (ref 5.0–8.0)

## 2019-06-29 MED ORDER — CIPROFLOXACIN HCL 500 MG PO TABS: 500.0000 mg | ORAL_TABLET | Freq: Two times a day (BID) | ORAL | 0 refills | Status: AC

## 2019-06-29 NOTE — Patient Instructions (Addendum)
Urinary Tract Infection, Adult A urinary tract infection (UTI) is an infection of any part of the urinary tract. The urinary tract includes:  The kidneys.  The ureters.  The bladder.  The urethra. These organs make, store, and get rid of pee (urine) in the body. What are the causes? This is caused by germs (bacteria) in your genital area. These germs grow and cause swelling (inflammation) of your urinary tract. What increases the risk? You are more likely to develop this condition if:  You have a small, thin tube (catheter) to drain pee.  You cannot control when you pee or poop (incontinence).  You are female, and: ? You use these methods to prevent pregnancy:  A medicine that kills sperm (spermicide).  A device that blocks sperm (diaphragm). ? You have low levels of a female hormone (estrogen). ? You are pregnant.  You have genes that add to your risk.  You are sexually active.  You take antibiotic medicines.  You have trouble peeing because of: ? A prostate that is bigger than normal, if you are female. ? A blockage in the part of your body that drains pee from the bladder (urethra). ? A kidney stone. ? A nerve condition that affects your bladder (neurogenic bladder). ? Not getting enough to drink. ? Not peeing often enough.  You have other conditions, such as: ? Diabetes. ? A weak disease-fighting system (immune system). ? Sickle cell disease. ? Gout. ? Injury of the spine. What are the signs or symptoms? Symptoms of this condition include:  Needing to pee right away (urgently).  Peeing often.  Peeing small amounts often.  Pain or burning when peeing.  Blood in the pee.  Pee that smells bad or not like normal.  Trouble peeing.  Pee that is cloudy.  Fluid coming from the vagina, if you are female.  Pain in the belly or lower back. Other symptoms include:  Throwing up (vomiting).  No urge to eat.  Feeling mixed up (confused).  Being tired  and grouchy (irritable).  A fever.  Watery poop (diarrhea). How is this treated? This condition may be treated with:  Antibiotic medicine.  Other medicines.  Drinking enough water. Follow these instructions at home:  Medicines  Take over-the-counter and prescription medicines only as told by your doctor.  If you were prescribed an antibiotic medicine, take it as told by your doctor. Do not stop taking it even if you start to feel better. General instructions  Make sure you: ? Pee until your bladder is empty. ? Do not hold pee for a long time. ? Empty your bladder after sex. ? Wipe from front to back after pooping if you are a female. Use each tissue one time when you wipe.  Drink enough fluid to keep your pee pale yellow.  Keep all follow-up visits as told by your doctor. This is important. Contact a doctor if:  You do not get better after 1-2 days.  Your symptoms go away and then come back. Get help right away if:  You have very bad back pain.  You have very bad pain in your lower belly.  You have a fever.  You are sick to your stomach (nauseous).  You are throwing up. Summary  A urinary tract infection (UTI) is an infection of any part of the urinary tract.  This condition is caused by germs in your genital area.  There are many risk factors for a UTI. These include having a small, thin   tube to drain pee and not being able to control when you pee or poop.  Treatment includes antibiotic medicines for germs.  Drink enough fluid to keep your pee pale yellow. This information is not intended to replace advice given to you by your health care provider. Make sure you discuss any questions you have with your health care provider. Document Revised: 01/09/2018 Document Reviewed: 08/01/2017 Elsevier Patient Education  2020 Elsevier Inc.  

## 2019-06-29 NOTE — Progress Notes (Signed)
[name] 54 y.o.   Chief Complaint  Patient presents with  . Urinary Urgency  . Urinary Frequency    HISTORY OF PRESENT ILLNESS: This is a 54 y.o. female complaining of urinary frequency and bladder pain that started 3 days ago. Also complaining of chills, headache, and not feeling well.  Denies nausea Grant vomiting.  Still able to eat and drink.  Has some pain to right lumbar area but denies flank pain.  Patient has history of diabetes and history of kidney infection about 10 years ago.  Allergic to Rocephin, Bactrim, and doxycycline.  No other associated symptoms. Fully vaccinated against Covid. No other complaints Grant medical concerns today.  HPI   Prior to Admission medications   Medication Sig Start Date End Date Taking? Authorizing Provider  albuterol (VENTOLIN HFA) 108 (90 Base) MCG/ACT inhaler INHALE 1-2 PUFFS INTO THE LUNGS EVERY 6 (SIX) HOURS AS NEEDED FOR WHEEZING Grant SHORTNESS OF BREATH. 05/25/19   [name], Irma M, MD  atorvastatin (LIPITOR) 40 MG tablet Take 1 tablet (40 mg total) by mouth daily. 11/24/18   [name], Irma M, MD  buPROPion [facility](WELLBUTRIN) 75 MG tablet TAKE 1 TABLET BY MOUTH TWICE A DAY 02/17/19   [name], Irma M, MD  cetirizine (ZYRTEC) 10 MG tablet Take 1 tablet (10 mg total) by mouth daily. Patient not taking: Reported on 06/02/2019 05/07/17   [name], Mario, Jocelyn  escitalopram (LEXAPRO) 20 MG tablet TAKE 1 TABLET BY MOUTH EVERYDAY AT BEDTIME 06/02/19   [name], Irma M, MD  fluticasone [facility](FLONASE) 50 MCG/ACT nasal spray Place 1 spray into both nostrils daily. 04/17/19   Hall-Potvin, [place], Jocelyn  Fluticasone-Salmeterol (WIXELA INHUB) 250-50 MCG/DOSE AEPB TAKE 1 PUFF BY MOUTH TWICE A DAY 06/02/19   [name], Irma M, MD  hydrOXYzine (ATARAX/VISTARIL) 25 MG tablet TAKE HALF-1 TABLETS BY MOUTH 3 TIMES DAILY AS NEEDED FOR ANXIETY/SLEEP 01/05/19   [name], Irma M, MD  lisinopril-hydrochlorothiazide (ZESTORETIC) 10-12.5 MG tablet TAKE 1 TABLET BY MOUTH EVERY DAY 06/19/19    [name], Irma M, MD  metFORMIN (GLUCOPHAGE) 500 MG tablet TAKE 1 TABLET BY MOUTH EVERY DAY WITH BREAKFAST 05/29/19   [name], Irma M, MD  montelukast (SINGULAIR) 10 MG tablet Take 1 tablet (10 mg total) by mouth at bedtime. 06/02/19   [name], Irma M, MD    Allergies  Allergen Reactions  . Amlodipine Benzoate     Dizziness, light headness, nausea  . Bactrim [Sulfamethoxazole-Trimethoprim]     Lip swelling, mouth itching  . Doxycycline     Severe headache  . Rocephin [Ceftriaxone Sodium] Hives, Itching and Other (See Comments)    wheezing    Patient Active Problem List   Diagnosis Date Noted  . Moderate persistent asthma without complication 06/02/2019  . Accelerated hypertension 05/18/2013  . Essential hypertension, benign 06/05/2011  . Diabetes mellitus without mention of complication 06/05/2011  . Asthma 07/21/2010  . Heart murmur 07/21/2010  . Hyperlipidemia 07/21/2010  . Rhinitis, allergic 07/21/2010    Past Medical History:  Diagnosis Date  . Allergy   . Asthma   . Cushing syndrome (HCC)    ?-pt states she was given this diagnosis during a hospitalization.  Records reviewed, no mention of dx Grant treatment  . Diabetes mellitus   . Heart murmur    mild TR  . Hyperlipidemia   . Hypertension   . Sleep apnea     Past Surgical History:  Procedure Laterality Date  . CERVICAL CONIZATION W/BX  1999  . CHOLECYSTECTOMY N/A 01/31/2014  Procedure: LAPAROSCOPIC CHOLECYSTECTOMY ;  Surgeon: Jocelyn Miyamoto, MD;  Location: [name];  Service: General;  Laterality: N/A;  . GALLBLADDER SURGERY  Jan 31, 2014  . TUBAL LIGATION  01/10/2000    Social History   Socioeconomic History  . Marital status: Divorced    Spouse name: Not on file  . Number of children: 3  . Years of education: College  . Highest education level: Not on file  Occupational History  . Occupation: Counselling psychologist: [employer]  Tobacco Use  . Smoking status: Former Smoker    Packs/day:  0.25    Types: Cigarettes    Quit date: 02/21/2012    Years since quitting: 7.3  . Smokeless tobacco: Never Used  Substance and Sexual Activity  . Alcohol use: No    Alcohol/week: 0.0 standard drinks  . Drug use: No  . Sexual activity: Yes    Partners: Male    Birth control/protection: Surgical    Comment: BTL  Other Topics Concern  . Not on file  Social History Narrative   Patient is separated and lives at home with her children.   Patient has three children   Patient has a college education.   Patient is right-handed.   Patient drinks a 12 oz of coffee every morning.   Social Determinants of Health   Financial Resource Strain:   . Difficulty of Paying Living Expenses:   Food Insecurity:   . Worried About Programme researcher, broadcasting/film/video in the Last Year:   . Barista in the Last Year:   Transportation Needs:   . Freight forwarder (Medical):   Marland Kitchen Lack of Transportation (Non-Medical):   Physical Activity:   . Days of Exercise per Week:   . Minutes of Exercise per Session:   Stress:   . Feeling of Stress :   Social Connections:   . Frequency of Communication with Friends and Family:   . Frequency of Social Gatherings with Friends and Family:   . Attends Religious Services:   . Active Member of Clubs Grant Organizations:   . Attends Banker Meetings:   Marland Kitchen Marital Status:   Intimate Partner Violence:   . Fear of Current Grant Ex-Partner:   . Emotionally Abused:   Marland Kitchen Physically Abused:   . Sexually Abused:     Family History  Problem Relation Age of Onset  . Thyroid disease Sister   . Hyperlipidemia Mother   . Heart murmur Daughter   . Heart murmur Son   . Heart murmur Daughter   . Asthma Daughter   . Hypertension Father   . Cancer Cousin 13       female cousin--unknown primary, metastatic  . Pulmonary embolism Daughter 66  . Diabetes Neg Hx      Review of Systems  Constitutional: Positive for chills.  HENT: Negative.  Negative for congestion and  sore throat.   Respiratory: Negative.  Negative for cough and shortness of breath.   Cardiovascular: Negative.  Negative for chest pain and palpitations.  Gastrointestinal: Negative.  Negative for abdominal pain, blood in stool, diarrhea, nausea and vomiting.  Genitourinary: Positive for dysuria, frequency and urgency. Negative for flank pain and hematuria.  Musculoskeletal: Positive for myalgias.  Neurological: Positive for headaches.  All other systems reviewed and are negative.  Vitals:   06/29/19 1403  BP: 132/79  Pulse: (!) 111  Resp: 16  Temp: 99.5 F (37.5 C)  SpO2: 95%  Physical Exam Vitals reviewed.  Constitutional:      Appearance: Normal appearance. She is obese.  HENT:     Head: Normocephalic.     Mouth/Throat:     Mouth: Mucous membranes are moist.     Pharynx: Oropharynx is clear.  Eyes:     Extraocular Movements: Extraocular movements intact.     Conjunctiva/sclera: Conjunctivae normal.     Pupils: Pupils are equal, round, and reactive to light.  Cardiovascular:     Rate and Rhythm: Normal rate and regular rhythm.     Pulses: Normal pulses.     Heart sounds: Normal heart sounds.  Pulmonary:     Effort: Pulmonary effort is normal.     Breath sounds: Normal breath sounds.  Abdominal:     General: Bowel sounds are normal. There is no distension.     Palpations: Abdomen is soft.     Tenderness: There is no abdominal tenderness. There is no right CVA tenderness Grant left CVA tenderness.  Musculoskeletal:        General: Normal range of motion.     Cervical back: Normal range of motion.  Skin:    General: Skin is warm and dry.     Capillary Refill: Capillary refill takes less than 2 seconds.  Neurological:     General: No focal deficit present.     Mental Status: She is alert and oriented to person, place, and time.  Psychiatric:        Mood and Affect: Mood normal.        Behavior: Behavior normal.     Results for orders placed Grant performed in  visit on 06/29/19 (from the past 24 hour(s))  POCT urinalysis dipstick     Status: Abnormal   Collection Time: 06/29/19  2:02 PM  Result Value Ref Range   Color, UA orange (A) yellow   Clarity, UA cloudy (A) clear   Glucose, UA =100 (A) negative mg/dL   Bilirubin, UA negative negative   Ketones, POC UA negative negative mg/dL   Spec Grav, UA 7.544 9.201 - 1.025   Blood, UA moderate (A) negative   pH, UA 7.0 5.0 - 8.0   Protein Ur, POC =30 (A) negative mg/dL   Urobilinogen, UA 2.0 (A) 0.2 Grant 1.0 E.U./dL   Nitrite, UA Positive (A) Negative   Leukocytes, UA Small (1+) (A) Negative  A total of 30 minutes was spent with the patient, greater than 50% of which was in counseling/coordination of care regarding diagnosis and treatment of UTI, need for antibiotics, ED precautions, diet nutrition and need for hydration, complications of UTIs including pyelonephritis, prognosis and need for follow-up.   ASSESSMENT & PLAN: Rheta was seen today for urinary urgency and urinary frequency.  Diagnoses and all orders for this visit:  Dysuria -     POCT urinalysis dipstick -     Urine Culture  Acute UTI -     ciprofloxacin (CIPRO) 500 MG tablet; Take 1 tablet (500 mg total) by mouth 2 (two) times daily for 7 days.  History of diabetes mellitus, type II  History of hypertension    Patient Instructions  Urinary Tract Infection, Adult A urinary tract infection (UTI) is an infection of any part of the urinary tract. The urinary tract includes:  The kidneys.  The ureters.  The bladder.  The urethra. These organs make, store, and get rid of pee (urine) in the body. What are the causes? This is caused by germs (bacteria) in your  genital area. These germs grow and cause swelling (inflammation) of your urinary tract. What increases the risk? You are more likely to develop this condition if:  You have a small, thin tube (catheter) to drain pee.  You cannot control when you pee Grant poop  (incontinence).  You are female, and: ? You use these methods to prevent pregnancy:  A medicine that kills sperm (spermicide).  A device that blocks sperm (diaphragm). ? You have low levels of a female hormone (estrogen). ? You are pregnant.  You have genes that add to your risk.  You are sexually active.  You take antibiotic medicines.  You have trouble peeing because of: ? A prostate that is bigger than normal, if you are female. ? A blockage in the part of your body that drains pee from the bladder (urethra). ? A kidney stone. ? A nerve condition that affects your bladder (neurogenic bladder). ? Not getting enough to drink. ? Not peeing often enough.  You have other conditions, such as: ? Diabetes. ? A weak disease-fighting system (immune system). ? Sickle cell disease. ? Gout. ? Injury of the spine. What are the signs Grant symptoms? Symptoms of this condition include:  Needing to pee right away (urgently).  Peeing often.  Peeing small amounts often.  Pain Grant burning when peeing.  Blood in the pee.  Pee that smells bad Grant not like normal.  Trouble peeing.  Pee that is cloudy.  Fluid coming from the vagina, if you are female.  Pain in the belly Grant lower back. Other symptoms include:  Throwing up (vomiting).  No urge to eat.  Feeling mixed up (confused).  Being tired and grouchy (irritable).  A fever.  Watery poop (diarrhea). How is this treated? This condition may be treated with:  Antibiotic medicine.  Other medicines.  Drinking enough water. Follow these instructions at home:  Medicines  Take over-the-counter and prescription medicines only as told by your doctor.  If you were prescribed an antibiotic medicine, take it as told by your doctor. Do not stop taking it even if you start to feel better. General instructions  Make sure you: ? Pee until your bladder is empty. ? Do not hold pee for a long time. ? Empty your bladder after  sex. ? Wipe from front to back after pooping if you are a female. Use each tissue one time when you wipe.  Drink enough fluid to keep your pee pale yellow.  Keep all follow-up visits as told by your doctor. This is important. Contact a doctor if:  You do not get better after 1-2 days.  Your symptoms go away and then come back. Get help right away if:  You have very bad back pain.  You have very bad pain in your lower belly.  You have a fever.  You are sick to your stomach (nauseous).  You are throwing up. Summary  A urinary tract infection (UTI) is an infection of any part of the urinary tract.  This condition is caused by germs in your genital area.  There are many risk factors for a UTI. These include having a small, thin tube to drain pee and not being able to control when you pee Grant poop.  Treatment includes antibiotic medicines for germs.  Drink enough fluid to keep your pee pale yellow. This information is not intended to replace advice given to you by your health care provider. Make sure you discuss any questions you have with your health  care provider. Document Revised: 01/09/2018 Document Reviewed: 08/01/2017 Elsevier Patient Education  2020 Elsevier Inc.      Agustina Caroli, MD [facility]

## 2019-07-01 ENCOUNTER — Telehealth: Payer: Self-pay | Admitting: Family Medicine

## 2019-07-01 ENCOUNTER — Encounter: Payer: Self-pay | Admitting: Emergency Medicine

## 2019-07-01 NOTE — Telephone Encounter (Signed)
Okay to write her a work note as needed.  Thanks.

## 2019-07-01 NOTE — Telephone Encounter (Signed)
Patient called to request a work note excusing her until 07/06/19 she is still having some back pain and still some pain with urination has been taking antibiotics and antipyretics about twice per day. she says is feeling better than she was on Monday. She is requesting to be cleared from work until 07/06/19. Please advice

## 2019-07-01 NOTE — Telephone Encounter (Signed)
Left message for the patient informing letter is available on mychart or may contact us for a copy.

## 2019-07-01 NOTE — Telephone Encounter (Signed)
Pt called and is needing a note for work. She is wanting the note to say that she can come back on Monday 07/06/19. Please advise. [phone]

## 2019-07-02 LAB — URINE CULTURE

## 2019-07-16 ENCOUNTER — Other Ambulatory Visit: Payer: Self-pay | Admitting: Family Medicine

## 2019-07-16 MED ORDER — ALBUTEROL SULFATE HFA 108 (90 BASE) MCG/ACT IN AERS: 1.0000 | INHALATION_SPRAY | Freq: Four times a day (QID) | RESPIRATORY_TRACT | 0 refills | Status: DC | PRN

## 2019-07-16 NOTE — Telephone Encounter (Signed)
Requested Prescriptions  Pending Prescriptions Disp Refills   albuterol (VENTOLIN HFA) 108 (90 Base) MCG/ACT inhaler [Pharmacy Med Name: ALBUTEROL HFA (VENTOLIN) INH] 18 g 0    Sig: INHALE 1-2 PUFFS INTO THE LUNGS EVERY 6 (SIX) HOURS AS NEEDED FOR WHEEZING OR SHORTNESS OF BREATH.     Pulmonology:  Beta Agonists Failed - 07/16/2019  4:33 PM      Failed - One inhaler should last at least one month. If the patient is requesting refills earlier, contact the patient to check for uncontrolled symptoms.      Passed - Valid encounter within last 12 months    Recent Outpatient Visits          2 weeks ago Dysuria   Primary Care at [facility], Eilleen Kempf, MD   1 month ago Essential hypertension, benign   [facility], Meda Coffee, MD   8 months ago Essential hypertension, benign   [facility], Meda Coffee, MD   1 year ago Essential hypertension   [facility] at [facility], Asencion Partridge, MD   1 year ago Chest pain, unspecified type   [facility] at [facility], Asencion Partridge, MD      Future Appointments            In 4 months Myles Lipps, MD Primary Care at [place], [facility]

## 2019-08-01 ENCOUNTER — Encounter: Payer: Self-pay | Admitting: Family Medicine

## 2019-08-17 ENCOUNTER — Encounter: Payer: Self-pay | Admitting: Family Medicine

## 2019-08-22 ENCOUNTER — Other Ambulatory Visit: Payer: Self-pay | Admitting: Family Medicine

## 2019-08-22 NOTE — Telephone Encounter (Signed)
Requested Prescriptions  Pending Prescriptions Disp Refills  . metFORMIN (GLUCOPHAGE) 500 MG tablet [Pharmacy Med Name: METFORMIN HCL 500 MG TABLET] 90 tablet 0    Sig: TAKE 1 TABLET BY MOUTH EVERY DAY WITH BREAKFAST     Endocrinology:  Diabetes - Biguanides Passed - 08/22/2019 10:10 AM      Passed - Cr in normal range and within 360 days    Creat  Date Value Ref Range Status  06/05/2011 0.83 0.50 - 1.10 mg/dL Final   Creatinine, Ser  Date Value Ref Range Status  06/02/2019 0.92 0.57 - 1.00 mg/dL Final   Creatinine, Urine  Date Value Ref Range Status  09/21/2010 213.9 mg/dL Final         Passed - HBA1C is between 0 and 7.9 and within 180 days    Hgb A1c MFr Bld  Date Value Ref Range Status  06/02/2019 6.5 (H) 4.8 - 5.6 % Final    Comment:             Prediabetes: 5.7 - 6.4          Diabetes: >6.4          Glycemic control for adults with diabetes: <7.0          Passed - eGFR in normal range and within 360 days    GFR calc Af Amer  Date Value Ref Range Status  06/02/2019 83 >59 mL/min/1.73 Final    Comment:    **[facility] currently reports eGFR in compliance with the current**   recommendations of the [employer]. [facility] will   update reporting as new guidelines are published from the NKF-ASN   Task force.    GFR calc non Af Amer  Date Value Ref Range Status  06/02/2019 72 >59 mL/min/1.73 Final         Passed - Valid encounter within last 6 months    Recent Outpatient Visits          1 month ago Dysuria   Primary Care at [facility], Ines Bloomer, MD   2 months ago Essential hypertension, benign   [facility] at [facility], Lilia Argue, MD   9 months ago Essential hypertension, benign   [facility] at [facility], Lilia Argue, MD   1 year ago Essential hypertension   [facility] at [facility], Ranell Patrick, MD   1 year ago Chest pain, unspecified type   [facility] at [facility], Ranell Patrick, MD      Future Appointments            In  3 months Rutherford Guys, MD Primary Care at [place], [facility]

## 2019-09-01 ENCOUNTER — Other Ambulatory Visit: Payer: Self-pay | Admitting: Family Medicine

## 2019-09-01 NOTE — Telephone Encounter (Signed)
Requested Prescriptions  Pending Prescriptions Disp Refills   albuterol (VENTOLIN HFA) 108 (90 Base) MCG/ACT inhaler [Pharmacy Med Name: ALBUTEROL HFA (VENTOLIN) INH] 18 g 2    Sig: INHALE 1-2 PUFFS INTO THE LUNGS EVERY 6 (SIX) HOURS AS NEEDED FOR WHEEZING OR SHORTNESS OF BREATH.     Pulmonology:  Beta Agonists Failed - 09/01/2019  1:27 AM      Failed - One inhaler should last at least one month. If the patient is requesting refills earlier, contact the patient to check for uncontrolled symptoms.      Passed - Valid encounter within last 12 months    Recent Outpatient Visits          2 months ago Dysuria   Primary Care at [facility], Eilleen Kempf, MD   3 months ago Essential hypertension, benign   [facility] at [facility], Meda Coffee, MD   9 months ago Essential hypertension, benign   [facility] at [facility], Meda Coffee, MD   1 year ago Essential hypertension   [facility] at [facility], Asencion Partridge, MD   1 year ago Chest pain, unspecified type   [facility] at [facility], Asencion Partridge, MD      Future Appointments            In 3 months Myles Lipps, MD Primary Care at [place], [facility]

## 2019-09-04 ENCOUNTER — Other Ambulatory Visit: Payer: Self-pay | Admitting: Family Medicine

## 2019-09-08 ENCOUNTER — Encounter: Payer: Self-pay | Admitting: Family Medicine

## 2019-10-08 ENCOUNTER — Other Ambulatory Visit: Payer: Self-pay | Admitting: Family Medicine

## 2019-10-08 DIAGNOSIS — F418 Other specified anxiety disorders: Secondary | ICD-10-CM

## 2019-11-02 ENCOUNTER — Encounter: Payer: Self-pay | Admitting: Family Medicine

## 2019-12-01 ENCOUNTER — Ambulatory Visit: Payer: Federal, State, Local not specified - PPO | Admitting: Family Medicine

## 2019-12-03 ENCOUNTER — Ambulatory Visit: Payer: Federal, State, Local not specified - PPO | Admitting: Family Medicine

## 2019-12-03 ENCOUNTER — Encounter: Payer: Self-pay | Admitting: Family Medicine

## 2019-12-03 ENCOUNTER — Other Ambulatory Visit: Payer: Self-pay

## 2019-12-03 VITALS — BP 155/88 | HR 78 | Temp 98.4°F | Ht 64.0 in | Wt 259.0 lb

## 2019-12-03 DIAGNOSIS — J4540 Moderate persistent asthma, uncomplicated: Secondary | ICD-10-CM | POA: Diagnosis not present

## 2019-12-03 DIAGNOSIS — Z114 Encounter for screening for human immunodeficiency virus [HIV]: Secondary | ICD-10-CM

## 2019-12-03 DIAGNOSIS — I10 Essential (primary) hypertension: Secondary | ICD-10-CM

## 2019-12-03 DIAGNOSIS — Z1159 Encounter for screening for other viral diseases: Secondary | ICD-10-CM

## 2019-12-03 DIAGNOSIS — N939 Abnormal uterine and vaginal bleeding, unspecified: Secondary | ICD-10-CM

## 2019-12-03 DIAGNOSIS — Z1321 Encounter for screening for nutritional disorder: Secondary | ICD-10-CM

## 2019-12-03 DIAGNOSIS — F418 Other specified anxiety disorders: Secondary | ICD-10-CM | POA: Diagnosis not present

## 2019-12-03 DIAGNOSIS — Z1329 Encounter for screening for other suspected endocrine disorder: Secondary | ICD-10-CM | POA: Diagnosis not present

## 2019-12-03 DIAGNOSIS — E119 Type 2 diabetes mellitus without complications: Secondary | ICD-10-CM | POA: Diagnosis not present

## 2019-12-03 DIAGNOSIS — E1169 Type 2 diabetes mellitus with other specified complication: Secondary | ICD-10-CM

## 2019-12-03 DIAGNOSIS — E785 Hyperlipidemia, unspecified: Secondary | ICD-10-CM

## 2019-12-03 DIAGNOSIS — B351 Tinea unguium: Secondary | ICD-10-CM

## 2019-12-03 DIAGNOSIS — J301 Allergic rhinitis due to pollen: Secondary | ICD-10-CM

## 2019-12-03 MED ORDER — EFINACONAZOLE 10 % EX SOLN: 1.0000 "application " | Freq: Every day | CUTANEOUS | 3 refills | Status: DC

## 2019-12-03 MED ORDER — ROSUVASTATIN CALCIUM 10 MG PO TABS: 10.0000 mg | ORAL_TABLET | Freq: Every day | ORAL | 3 refills | Status: AC

## 2019-12-03 MED ORDER — LISINOPRIL-HYDROCHLOROTHIAZIDE 20-25 MG PO TABS: 1.0000 | ORAL_TABLET | Freq: Every day | ORAL | 3 refills | Status: AC

## 2019-12-03 MED ORDER — METFORMIN HCL 500 MG PO TABS: ORAL_TABLET | ORAL | 3 refills | Status: DC

## 2019-12-03 NOTE — Progress Notes (Addendum)
10/28/20215:05 PM  [name] 1965-05-03, 54 y.o., female [id number]  Chief Complaint  Patient presents with  . Hypertension    ankle swelling   . Diabetes    a1c check, foot exam  . Hyperlipidemia    lipitor causes aches  . Asthma    HPI:   Patient is a 54 y.o. female with past medical history significant for HTN, HLD, DM, and asthma who presents today for follow up   OB referral due to menstrual bleeding (period gone for about 2 years, then had bleeding last month for 4-5 days. [place] to period stopping was only having one a year for about 3-4 years.)  Cologuard sent? 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  Mammogram: 03/09/2019: follow up 1 year  PaP done 2019: HPV negative, and Negative for abnormalities: due 2024  She has been having issues with nail fungus but doesn't want to take oral medications   Hypertension Lisinopril/HCTZ 10/12.5  20/25 Goal<140/90 Do not take BP at home, will restart BP Readings from Last 3 Encounters:  12/03/19 (!) 155/88  06/29/19 132/79  06/02/19 129/81    Diabetes Metformin 500mg  daily Diabetes try Ozempic/Trulicity  Lab Results  Component Value Date   HGBA1C 6.5 (H) 06/02/2019    HLD Atorvastatin 40mg  Giving her aches Try rosuvastatin/crestor Lab Results  Component Value Date   CHOL 187 06/02/2019   HDL 55 06/02/2019   LDLCALC 96 06/02/2019   TRIG 211 (H) 06/02/2019   CHOLHDL 3.4 06/02/2019   Asthma/Allergies Albuterol PRN: not expired Wixela daily flonase Atarax  Anxiety Wellbutrin only taking as needed Atarax for sleep Lexapro 20mg  daily Goes to counseling  Depression screen [facility] 2/9 12/03/2019 06/29/2019 06/02/2019  Decreased Interest 0 0 0  Down, Depressed, Hopeless 0 0 0  PHQ - 2 Score 0 0 0  Altered sleeping - - -  Tired, decreased energy - - -  Change in appetite - - -  Feeling bad or failure about yourself  - - -  Trouble concentrating - - -  Moving slowly or  fidgety/restless - - -  Suicidal thoughts - - -  PHQ-9 Score - - -  Difficult doing work/chores - - -    Fall Risk  12/03/2019 06/29/2019 06/02/2019 05/02/2018 02/19/2018  Falls in the past year? 0 0 0 0 0  Number falls in past yr: 0 - 0 0 0  Injury with Fall? 0 - 0 0 0  Follow up Falls evaluation completed Falls evaluation completed Falls evaluation completed - -     Allergies  Allergen Reactions  . Amlodipine Benzoate     Dizziness, light headness, nausea  . Bactrim [Sulfamethoxazole-Trimethoprim]     Lip swelling, mouth itching  . Doxycycline     Severe headache  . Rocephin [Ceftriaxone Sodium] Hives, Itching and Other (See Comments)    wheezing    Prior to Admission medications   Medication Sig Start Date End Date Taking? Authorizing Provider  albuterol (VENTOLIN HFA) 108 (90 Base) MCG/ACT inhaler INHALE 1-2 PUFFS INTO THE LUNGS EVERY 6 (SIX) HOURS AS NEEDED FOR WHEEZING OR SHORTNESS OF BREATH. 09/01/19   05/04/2018, MD  atorvastatin (LIPITOR) 40 MG tablet Take 1 tablet (40 mg total) by mouth daily. 11/24/18   09/03/19, MD  buPROPion (WELLBUTRIN) 75 MG tablet TAKE 1 TABLET BY MOUTH TWICE A DAY 02/17/19   11/26/18, MD  cetirizine (ZYRTEC) 10 MG tablet Take  1 tablet (10 mg total) by mouth daily. Patient not taking: Reported on 06/29/2019 05/07/17   Wallis Bamberg, PA-C  escitalopram (LEXAPRO) 20 MG tablet TAKE 1 TABLET BY MOUTH EVERYDAY AT BEDTIME 06/02/19   Myles Lipps, MD  fluticasone [facility]) 50 MCG/ACT nasal spray Place 1 spray into both nostrils daily. 04/17/19   Hall-Potvin, [place], PA-C  hydrOXYzine (ATARAX/VISTARIL) 25 MG tablet TAKE HALF-1 TABLETS BY MOUTH 3 TIMES DAILY AS NEEDED FOR ANXIETY/SLEEP 10/08/19   Myles Lipps, MD  lisinopril-hydrochlorothiazide (ZESTORETIC) 10-12.5 MG tablet TAKE 1 TABLET BY MOUTH EVERY DAY 06/19/19   Myles Lipps, MD  metFORMIN (GLUCOPHAGE) 500 MG tablet TAKE 1 TABLET BY MOUTH EVERY DAY WITH BREAKFAST 08/22/19    Myles Lipps, MD  montelukast (SINGULAIR) 10 MG tablet Take 1 tablet (10 mg total) by mouth at bedtime. 06/02/19   Myles Lipps, MD  Monte Fantasia INHUB 250-50 MCG/DOSE AEPB INHALE 1 PUFF BY MOUTH TWICE A DAY 09/04/19   Myles Lipps, MD    Past Medical History:  Diagnosis Date  . Allergy   . Asthma   . Cushing syndrome (HCC)    ?-pt states she was given this diagnosis during a hospitalization.  Records reviewed, no mention of dx or treatment  . Diabetes mellitus   . Heart murmur    mild TR  . Hyperlipidemia   . Hypertension   . Sleep apnea     Past Surgical History:  Procedure Laterality Date  . CERVICAL CONIZATION W/BX  1999  . CHOLECYSTECTOMY N/A 01/31/2014   Procedure: LAPAROSCOPIC CHOLECYSTECTOMY ;  Surgeon: Abigail Miyamoto, MD;  Location: [facility] OR;  Service: General;  Laterality: N/A;  . GALLBLADDER SURGERY  Jan 31, 2014  . TUBAL LIGATION  01/10/2000    Social History   Tobacco Use  . Smoking status: Former Smoker    Packs/day: 0.25    Types: Cigarettes    Quit date: 02/21/2012    Years since quitting: 7.7  . Smokeless tobacco: Never Used  Substance Use Topics  . Alcohol use: No    Alcohol/week: 0.0 standard drinks    Family History  Problem Relation Age of Onset  . Thyroid disease Sister   . Hyperlipidemia Mother   . Heart murmur Daughter   . Heart murmur Son   . Heart murmur Daughter   . Asthma Daughter   . Hypertension Father   . Cancer Cousin 80       female cousin--unknown primary, metastatic  . Pulmonary embolism Daughter 25  . Diabetes Neg Hx     Review of Systems  Constitutional: Negative for chills, fever and malaise/fatigue.  Eyes: Negative for blurred vision and double vision.  Respiratory: Negative for cough, shortness of breath and wheezing.   Cardiovascular: Negative for chest pain, palpitations and leg swelling.  Gastrointestinal: Negative for abdominal pain, blood in stool, constipation, diarrhea, heartburn, nausea and vomiting.   Genitourinary: Negative for dysuria, frequency and hematuria.  Musculoskeletal: Negative for back pain and joint pain.  Skin: Negative for rash.  Neurological: Negative for dizziness, weakness and headaches.  Psychiatric/Behavioral: Negative for suicidal ideas.     OBJECTIVE:  Today's Vitals   12/03/19 1607  BP: (!) 155/88  Pulse: 78  Temp: 98.4 F (36.9 C)  SpO2: 98%  Weight: 259 lb (117.5 kg)  Height: 5\' 4"  (1.626 m)   Body mass index is 44.46 kg/m.  Physical Exam Constitutional:      General: She is not in acute distress.  Appearance: Normal appearance. She is not ill-appearing.  HENT:     Head: Normocephalic.  Cardiovascular:     Rate and Rhythm: Normal rate and regular rhythm.     Pulses: Normal pulses.     Heart sounds: Normal heart sounds. No murmur heard.  No friction rub. No gallop.   Pulmonary:     Effort: Pulmonary effort is normal. No respiratory distress.     Breath sounds: Normal breath sounds. No stridor. No wheezing, rhonchi or rales.  Abdominal:     General: Bowel sounds are normal.     Palpations: Abdomen is soft.     Tenderness: There is no abdominal tenderness.  Musculoskeletal:     Right lower leg: No edema.     Left lower leg: No edema.  Skin:    General: Skin is warm and dry.  Neurological:     Mental Status: She is alert and oriented to person, place, and time.  Psychiatric:        Mood and Affect: Mood normal.        Behavior: Behavior normal.    No results found for this or any previous visit (from the past 24 hour(s)).  No results found.  ASSESSMENT and PLAN  Problem List Items Addressed This Visit      Cardiovascular and Mediastinum   Essential hypertension, benign   Relevant Medications   lisinopril-hydrochlorothiazide (ZESTORETIC) 20-25 MG tablet   Take 2 of the lisinopril/HCTZ a day: in 1 week 11/4 please message me your daily blood pressures. We will reevaluate from there.   Other Relevant Orders   Basic Metabolic  Panel     Respiratory   Rhinitis, allergic   Moderate persistent asthma without complication - Primary Albuterol PRN: not expired Wixela daily flonase Atarax     Endocrine   Diabetes (HCC)   Relevant Medications   lisinopril-hydrochlorothiazide (ZESTORETIC) 20-25 MG tablet   rosuvastatin (CRESTOR) 10 MG tablet   metFORMIN (GLUCOPHAGE) 500 MG tablet Depending on A1c can discuss (Ozempic, Trulicity)   Other Relevant Orders   Hemoglobin A1c   HM Diabetes Foot Exam (Completed)    Other Visit Diagnoses    Hyperlipidemia due to type 2 diabetes mellitus (HCC)       Relevant Medications   lisinopril-hydrochlorothiazide (ZESTORETIC) 20-25 MG tablet   rosuvastatin (CRESTOR) 10 MG tablet   metFORMIN (GLUCOPHAGE) 500 MG tablet Stop atorvastatin 40mg  and start rosuvastatin 10mg    Depression with anxiety       Relevant Orders   CBC Wellbutrin only taking as needed Atarax for sleep Lexapro 20mg  daily Goes to counseling   Encounter for hepatitis C screening test for low risk patient       Relevant Orders   Hepatitis C antibody   Encounter for screening for HIV       Relevant Orders   HIV Antibody (routine testing w rflx)   Screening for thyroid disorder       Relevant Orders   TSH   Encounter for vitamin deficiency screening       Relevant Orders   Vitamin D, 25-hydroxy   Abnormal uterine bleeding       Relevant Orders   Ambulatory referral to Gynecology   Onychomycosis       Relevant Medications   Efinaconazole 10 % SOLN Efinaconazole 10% solution is applied directly to the nails once daily for 48 weeks. One drop is applied to the surface of each affected nail; for the great toenail, an additional drop should be  applied at the end of the toenail. The application brush is used to spread the solution to the toenail bed, adjacent nail folds, hyponychium, and undersurface of the nail plate. Avoidance of pedicures, nail polish, or cosmetic nail products is recommended during  treatment.      Return in about 6 months (around 06/02/2020).   Macario Carls Kal Chait, FNP-BC Primary Care at [facility] [address] [place], [place] [address] Ph.  [phone] Fax [phone]  I have reviewed and agree with above documentation. Edwina Barth, MD

## 2019-12-03 NOTE — Patient Instructions (Addendum)
Take 2 of the lisinopril/HCTZ a day: in 1 week 11/4 please message me your daily blood pressures. We will reevaluate from there.  OB referral sent.  Depending on A1c can discuss (Ozempic, Trulicity)  Stop atorvastatin 40mg  and start rosuvastatin 10mg   Efinaconazole 10% solution is applied directly to the nails once daily for 48 weeks. One drop is applied to the surface of each affected nail; for the great toenail, an additional drop should be applied at the end of the toenail. The application brush is used to spread the solution to the toenail bed, adjacent nail folds, hyponychium, and undersurface of the nail plate. Avoidance of pedicures, nail polish, or cosmetic nail products is recommended during treatment.   Health Maintenance for Postmenopausal Women Menopause is a normal process in which your ability to get pregnant comes to an end. This process happens slowly over many months or years, usually between the ages of 52 and 58. Menopause is complete when you have missed your menstrual periods for 12 months. It is important to talk with your health care provider about some of the most common conditions that affect women after menopause (postmenopausal women). These include heart disease, cancer, and bone loss (osteoporosis). Adopting a healthy lifestyle and getting preventive care can help to promote your health and wellness. The actions you take can also lower your chances of developing some of these common conditions. What should I know about menopause? During menopause, you may get a number of symptoms, such as:  Hot flashes. These can be moderate or severe.  Night sweats.  Decrease in sex drive.  Mood swings.  Headaches.  Tiredness.  Irritability.  Memory problems.  Insomnia. Choosing to treat or not to treat these symptoms is a decision that you make with your health care provider. Do I need hormone replacement therapy?  Hormone replacement therapy is effective in  treating symptoms that are caused by menopause, such as hot flashes and night sweats.  Hormone replacement carries certain risks, especially as you become older. If you are thinking about using estrogen or estrogen with progestin, discuss the benefits and risks with your health care provider. What is my risk for heart disease and stroke? The risk of heart disease, heart attack, and stroke increases as you age. One of the causes may be a change in the body's hormones during menopause. This can affect how your body uses dietary fats, triglycerides, and cholesterol. Heart attack and stroke are medical emergencies. There are many things that you can do to help prevent heart disease and stroke. Watch your blood pressure  High blood pressure causes heart disease and increases the risk of stroke. This is more likely to develop in people who have high blood pressure readings, are of African descent, or are overweight.  Have your blood pressure checked: ? Every 3-5 years if you are 21-79 years of age. ? Every year if you are 52 years old or older. Eat a healthy diet   Eat a diet that includes plenty of vegetables, fruits, low-fat dairy products, and lean protein.  Do not eat a lot of foods that are high in solid fats, added sugars, or sodium. Get regular exercise Get regular exercise. This is one of the most important things you can do for your health. Most adults should:  Try to exercise for at least 150 minutes each week. The exercise should increase your heart rate and make you sweat (moderate-intensity exercise).  Try to do strengthening exercises at least twice each week.  Do these in addition to the moderate-intensity exercise.  Spend less time sitting. Even light physical activity can be beneficial. Other tips  Work with your health care provider to achieve or maintain a healthy weight.  Do not use any products that contain nicotine or tobacco, such as cigarettes, e-cigarettes, and chewing  tobacco. If you need help quitting, ask your health care provider.  Know your numbers. Ask your health care provider to check your cholesterol and your blood sugar (glucose). Continue to have your blood tested as directed by your health care provider. Do I need screening for cancer? Depending on your health history and family history, you may need to have cancer screening at different stages of your life. This may include screening for:  Breast cancer.  Cervical cancer.  Lung cancer.  Colorectal cancer. What is my risk for osteoporosis? After menopause, you may be at increased risk for osteoporosis. Osteoporosis is a condition in which bone destruction happens more quickly than new bone creation. To help prevent osteoporosis or the bone fractures that can happen because of osteoporosis, you may take the following actions:  If you are 14-75 years old, get at least 1,000 mg of calcium and at least 600 mg of vitamin D per day.  If you are older than age 19 but younger than age 52, get at least 1,200 mg of calcium and at least 600 mg of vitamin D per day.  If you are older than age 52, get at least 1,200 mg of calcium and at least 800 mg of vitamin D per day. Smoking and drinking excessive alcohol increase the risk of osteoporosis. Eat foods that are rich in calcium and vitamin D, and do weight-bearing exercises several times each week as directed by your health care provider. How does menopause affect my mental health? Depression may occur at any age, but it is more common as you become older. Common symptoms of depression include:  Low or sad mood.  Changes in sleep patterns.  Changes in appetite or eating patterns.  Feeling an overall lack of motivation or enjoyment of activities that you previously enjoyed.  Frequent crying spells. Talk with your health care provider if you think that you are experiencing depression. General instructions See your health care provider for regular  wellness exams and vaccines. This may include:  Scheduling regular health, dental, and eye exams.  Getting and maintaining your vaccines. These include: ? Influenza vaccine. Get this vaccine each year before the flu season begins. ? Pneumonia vaccine. ? Shingles vaccine. ? Tetanus, diphtheria, and pertussis (Tdap) booster vaccine. Your health care provider may also recommend other immunizations. Tell your health care provider if you have ever been abused or do not feel safe at home. Summary  Menopause is a normal process in which your ability to get pregnant comes to an end.  This condition causes hot flashes, night sweats, decreased interest in sex, mood swings, headaches, or lack of sleep.  Treatment for this condition may include hormone replacement therapy.  Take actions to keep yourself healthy, including exercising regularly, eating a healthy diet, watching your weight, and checking your blood pressure and blood sugar levels.  Get screened for cancer and depression. Make sure that you are up to date with all your vaccines. This information is not intended to replace advice given to you by your health care provider. Make sure you discuss any questions you have with your health care provider. Document Revised: 01/15/2018 Document Reviewed: 01/15/2018 Elsevier Patient Education  2020 [employer]   If you have lab work done today you will be contacted with your lab results within the next 2 weeks.  If you have not heard from [place] then please contact us. The fastest way to get your results is to register for My Chart.   IF you received an x-ray today, you will receive an invoice from [facility]. Please contact [facility] at [phone] with questions or concerns regarding your invoice.   IF you received labwork today, you will receive an invoice from [facility]. Please contact [facility] at [phone] with questions or concerns regarding your invoice.   Our  billing staff will not be able to assist you with questions regarding bills from these companies.  You will be contacted with the lab results as soon as they are available. The fastest way to get your results is to activate your My Chart account. Instructions are located on the last page of this paperwork. If you have not heard from [place] regarding the results in 2 weeks, please contact this office.

## 2019-12-04 LAB — HEMOGLOBIN A1C
Est. average glucose Bld gHb Est-mCnc: 105 mg/dL
Hgb A1c MFr Bld: 5.3 % (ref 4.8–5.6)

## 2019-12-04 LAB — BASIC METABOLIC PANEL
BUN/Creatinine Ratio: 14 (ref 9–23)
BUN: 10 mg/dL (ref 6–24)
CO2: 25 mmol/L (ref 20–29)
Calcium: 9.5 mg/dL (ref 8.7–10.2)
Chloride: 102 mmol/L (ref 96–106)
Creatinine, Ser: 0.72 mg/dL (ref 0.57–1.00)
GFR calc Af Amer: 111 mL/min/{1.73_m2} (ref >59)
GFR calc non Af Amer: 97 mL/min/{1.73_m2} (ref >59)
Glucose: 88 mg/dL (ref 65–99)
Potassium: 4 mmol/L (ref 3.5–5.2)
Sodium: 140 mmol/L (ref 134–144)

## 2019-12-04 LAB — CBC
Hematocrit: 46.7 % — ABNORMAL HIGH (ref 34.0–46.6)
Hemoglobin: 15.5 g/dL (ref 11.1–15.9)
MCH: 29.1 pg (ref 26.6–33.0)
MCHC: 33.2 g/dL (ref 31.5–35.7)
MCV: 88 fL (ref 79–97)
Platelets: 219 10*3/uL (ref 150–450)
RBC: 5.33 x10E6/uL — ABNORMAL HIGH (ref 3.77–5.28)
RDW: 12.5 % (ref 11.7–15.4)
WBC: 7 10*3/uL (ref 3.4–10.8)

## 2019-12-04 LAB — TSH: TSH: 2.42 u[IU]/mL (ref 0.450–4.500)

## 2019-12-04 LAB — HEPATITIS C ANTIBODY: Hep C Virus Ab: <0.1 s/co ratio (ref 0.0–0.9)

## 2019-12-04 LAB — VITAMIN D 25 HYDROXY (VIT D DEFICIENCY, FRACTURES): Vit D, 25-Hydroxy: 6.3 ng/mL — ABNORMAL LOW (ref 30.0–100.0)

## 2019-12-04 LAB — HIV ANTIBODY (ROUTINE TESTING W REFLEX): HIV Screen 4th Generation wRfx: NONREACTIVE

## 2019-12-08 ENCOUNTER — Encounter: Payer: Self-pay | Admitting: Family Medicine

## 2019-12-14 ENCOUNTER — Encounter: Payer: Self-pay | Admitting: Family Medicine

## 2019-12-14 ENCOUNTER — Other Ambulatory Visit: Payer: Self-pay | Admitting: Family Medicine

## 2019-12-14 DIAGNOSIS — E559 Vitamin D deficiency, unspecified: Secondary | ICD-10-CM

## 2019-12-14 MED ORDER — VITAMIN D (ERGOCALCIFEROL) 1.25 MG (50000 UNIT) PO CAPS: 50000.0000 [IU] | ORAL_CAPSULE | ORAL | 3 refills | Status: DC

## 2020-01-06 ENCOUNTER — Telehealth: Payer: Self-pay | Admitting: Family Medicine

## 2020-01-06 NOTE — Telephone Encounter (Signed)
Referral Followup °

## 2020-03-28 ENCOUNTER — Encounter: Payer: Self-pay | Admitting: Emergency Medicine

## 2020-03-28 DIAGNOSIS — I499 Cardiac arrhythmia, unspecified: Secondary | ICD-10-CM | POA: Diagnosis not present

## 2020-03-28 DIAGNOSIS — Z1231 Encounter for screening mammogram for malignant neoplasm of breast: Secondary | ICD-10-CM | POA: Diagnosis not present

## 2020-03-28 DIAGNOSIS — R55 Syncope and collapse: Secondary | ICD-10-CM | POA: Diagnosis not present

## 2020-04-05 ENCOUNTER — Telehealth: Payer: Self-pay | Admitting: *Deleted

## 2020-04-05 DIAGNOSIS — R928 Other abnormal and inconclusive findings on diagnostic imaging of breast: Secondary | ICD-10-CM | POA: Diagnosis not present

## 2020-04-05 DIAGNOSIS — R922 Inconclusive mammogram: Secondary | ICD-10-CM | POA: Diagnosis not present

## 2020-04-05 LAB — HM MAMMOGRAPHY

## 2020-04-05 NOTE — Telephone Encounter (Signed)
Faxed signed order to [facility] for mammogram of right breast, reason for exam inconclusive mammogram. confirmation at 10:35 am.

## 2020-04-07 ENCOUNTER — Encounter: Payer: Self-pay | Admitting: *Deleted

## 2020-04-27 ENCOUNTER — Encounter: Payer: Self-pay | Admitting: Emergency Medicine

## 2020-04-28 MED ORDER — ALBUTEROL SULFATE HFA 108 (90 BASE) MCG/ACT IN AERS
1.0000 | INHALATION_SPRAY | Freq: Four times a day (QID) | RESPIRATORY_TRACT | 2 refills | Status: AC | PRN
Start: 2020-04-28 — End: ?

## 2020-05-31 ENCOUNTER — Encounter: Payer: Federal, State, Local not specified - PPO | Admitting: Family Medicine

## 2020-06-02 ENCOUNTER — Ambulatory Visit: Payer: Federal, State, Local not specified - PPO | Admitting: Family Medicine

## 2021-02-10 DIAGNOSIS — R062 Wheezing: Secondary | ICD-10-CM | POA: Diagnosis not present

## 2021-02-10 DIAGNOSIS — J45901 Unspecified asthma with (acute) exacerbation: Secondary | ICD-10-CM | POA: Diagnosis not present

## 2021-02-10 DIAGNOSIS — Z6841 Body Mass Index (BMI) 40.0 and over, adult: Secondary | ICD-10-CM | POA: Diagnosis not present

## 2021-02-10 DIAGNOSIS — Z7689 Persons encountering health services in other specified circumstances: Secondary | ICD-10-CM | POA: Diagnosis not present

## 2021-02-13 DIAGNOSIS — Z20822 Contact with and (suspected) exposure to covid-19: Secondary | ICD-10-CM | POA: Diagnosis not present

## 2021-02-13 DIAGNOSIS — E559 Vitamin D deficiency, unspecified: Secondary | ICD-10-CM | POA: Diagnosis not present

## 2021-02-13 DIAGNOSIS — E785 Hyperlipidemia, unspecified: Secondary | ICD-10-CM | POA: Diagnosis not present

## 2021-02-13 DIAGNOSIS — E119 Type 2 diabetes mellitus without complications: Secondary | ICD-10-CM | POA: Diagnosis not present

## 2021-02-24 DIAGNOSIS — E1169 Type 2 diabetes mellitus with other specified complication: Secondary | ICD-10-CM | POA: Diagnosis not present

## 2021-02-24 DIAGNOSIS — E785 Hyperlipidemia, unspecified: Secondary | ICD-10-CM | POA: Diagnosis not present

## 2021-02-24 DIAGNOSIS — I10 Essential (primary) hypertension: Secondary | ICD-10-CM | POA: Diagnosis not present

## 2021-02-24 DIAGNOSIS — J4550 Severe persistent asthma, uncomplicated: Secondary | ICD-10-CM | POA: Diagnosis not present

## 2021-04-03 DIAGNOSIS — Z1231 Encounter for screening mammogram for malignant neoplasm of breast: Secondary | ICD-10-CM | POA: Diagnosis not present

## 2021-04-24 DIAGNOSIS — Z6841 Body Mass Index (BMI) 40.0 and over, adult: Secondary | ICD-10-CM | POA: Diagnosis not present

## 2021-04-24 DIAGNOSIS — R600 Localized edema: Secondary | ICD-10-CM | POA: Diagnosis not present

## 2021-04-24 DIAGNOSIS — M79605 Pain in left leg: Secondary | ICD-10-CM | POA: Diagnosis not present

## 2021-05-03 DIAGNOSIS — Z6841 Body Mass Index (BMI) 40.0 and over, adult: Secondary | ICD-10-CM | POA: Diagnosis not present

## 2021-05-03 DIAGNOSIS — S46219A Strain of muscle, fascia and tendon of other parts of biceps, unspecified arm, initial encounter: Secondary | ICD-10-CM | POA: Diagnosis not present

## 2021-05-03 DIAGNOSIS — S76312A Strain of muscle, fascia and tendon of the posterior muscle group at thigh level, left thigh, initial encounter: Secondary | ICD-10-CM | POA: Diagnosis not present

## 2021-05-11 DIAGNOSIS — I10 Essential (primary) hypertension: Secondary | ICD-10-CM | POA: Diagnosis not present

## 2021-05-11 DIAGNOSIS — Z6841 Body Mass Index (BMI) 40.0 and over, adult: Secondary | ICD-10-CM | POA: Diagnosis not present

## 2021-05-18 ENCOUNTER — Ambulatory Visit: Payer: Federal, State, Local not specified - PPO | Admitting: Emergency Medicine

## 2021-05-25 DIAGNOSIS — E785 Hyperlipidemia, unspecified: Secondary | ICD-10-CM | POA: Diagnosis not present

## 2021-05-25 DIAGNOSIS — I10 Essential (primary) hypertension: Secondary | ICD-10-CM | POA: Diagnosis not present

## 2021-05-25 DIAGNOSIS — E1169 Type 2 diabetes mellitus with other specified complication: Secondary | ICD-10-CM | POA: Diagnosis not present

## 2021-06-01 DIAGNOSIS — E785 Hyperlipidemia, unspecified: Secondary | ICD-10-CM | POA: Diagnosis not present

## 2021-06-01 DIAGNOSIS — E1169 Type 2 diabetes mellitus with other specified complication: Secondary | ICD-10-CM | POA: Diagnosis not present

## 2021-06-01 DIAGNOSIS — Z6841 Body Mass Index (BMI) 40.0 and over, adult: Secondary | ICD-10-CM | POA: Diagnosis not present

## 2021-06-01 DIAGNOSIS — I10 Essential (primary) hypertension: Secondary | ICD-10-CM | POA: Diagnosis not present

## 2022-04-09 DIAGNOSIS — Z1231 Encounter for screening mammogram for malignant neoplasm of breast: Secondary | ICD-10-CM | POA: Diagnosis not present

## 2022-05-17 DIAGNOSIS — Z20822 Contact with and (suspected) exposure to covid-19: Secondary | ICD-10-CM | POA: Diagnosis not present

## 2022-05-17 DIAGNOSIS — J45901 Unspecified asthma with (acute) exacerbation: Secondary | ICD-10-CM | POA: Diagnosis not present

## 2022-05-17 DIAGNOSIS — I10 Essential (primary) hypertension: Secondary | ICD-10-CM | POA: Diagnosis not present

## 2022-05-17 DIAGNOSIS — J4550 Severe persistent asthma, uncomplicated: Secondary | ICD-10-CM | POA: Diagnosis not present

## 2022-05-29 DIAGNOSIS — E1169 Type 2 diabetes mellitus with other specified complication: Secondary | ICD-10-CM | POA: Diagnosis not present

## 2022-05-31 DIAGNOSIS — Z6841 Body Mass Index (BMI) 40.0 and over, adult: Secondary | ICD-10-CM | POA: Diagnosis not present

## 2022-05-31 DIAGNOSIS — B353 Tinea pedis: Secondary | ICD-10-CM | POA: Diagnosis not present

## 2022-05-31 DIAGNOSIS — M7989 Other specified soft tissue disorders: Secondary | ICD-10-CM | POA: Diagnosis not present

## 2022-05-31 DIAGNOSIS — M79674 Pain in right toe(s): Secondary | ICD-10-CM | POA: Diagnosis not present

## 2022-05-31 DIAGNOSIS — R2241 Localized swelling, mass and lump, right lower limb: Secondary | ICD-10-CM | POA: Diagnosis not present

## 2022-06-02 ENCOUNTER — Emergency Department (HOSPITAL_BASED_OUTPATIENT_CLINIC_OR_DEPARTMENT_OTHER): Payer: Federal, State, Local not specified - PPO

## 2022-06-02 ENCOUNTER — Encounter (HOSPITAL_BASED_OUTPATIENT_CLINIC_OR_DEPARTMENT_OTHER): Payer: Self-pay

## 2022-06-02 ENCOUNTER — Emergency Department (HOSPITAL_BASED_OUTPATIENT_CLINIC_OR_DEPARTMENT_OTHER)
Admission: EM | Admit: 2022-06-02 | Discharge: 2022-06-02 | Disposition: A | Discharge status: Home / Self Care | Payer: Federal, State, Local not specified - PPO | Attending: Emergency Medicine | Admitting: Emergency Medicine

## 2022-06-02 ENCOUNTER — Other Ambulatory Visit: Payer: Self-pay

## 2022-06-02 DIAGNOSIS — M7989 Other specified soft tissue disorders: Secondary | ICD-10-CM | POA: Diagnosis not present

## 2022-06-02 DIAGNOSIS — M79671 Pain in right foot: Secondary | ICD-10-CM | POA: Diagnosis not present

## 2022-06-02 DIAGNOSIS — M79674 Pain in right toe(s): Secondary | ICD-10-CM | POA: Insufficient documentation

## 2022-06-02 DIAGNOSIS — R2241 Localized swelling, mass and lump, right lower limb: Secondary | ICD-10-CM | POA: Diagnosis not present

## 2022-06-02 LAB — CBC WITH DIFFERENTIAL/PLATELET
Abs Immature Granulocytes: 0.02 10*3/uL (ref 0.00–0.07)
Basophils Absolute: 0 10*3/uL (ref 0.0–0.1)
Basophils Relative: 0 %
Eosinophils Absolute: 0.3 10*3/uL (ref 0.0–0.5)
Eosinophils Relative: 3 %
HCT: 34.9 % — ABNORMAL LOW (ref 36.0–46.0)
Hemoglobin: 11.3 g/dL — ABNORMAL LOW (ref 12.0–15.0)
Immature Granulocytes: 0 %
Lymphocytes Relative: 31 %
Lymphs Abs: 2.6 10*3/uL (ref 0.7–4.0)
MCH: 23.7 pg — ABNORMAL LOW (ref 26.0–34.0)
MCHC: 32.4 g/dL (ref 30.0–36.0)
MCV: 73.3 fL — ABNORMAL LOW (ref 80.0–100.0)
Monocytes Absolute: 0.5 10*3/uL (ref 0.1–1.0)
Monocytes Relative: 6 %
Neutro Abs: 5 10*3/uL (ref 1.7–7.7)
Neutrophils Relative %: 60 %
Platelets: 338 10*3/uL (ref 150–400)
RBC: 4.76 MIL/uL (ref 3.87–5.11)
RDW: 15.5 % (ref 11.5–15.5)
WBC: 8.5 10*3/uL (ref 4.0–10.5)
nRBC: 0 % (ref 0.0–0.2)

## 2022-06-02 LAB — SYNOVIAL CELL COUNT + DIFF, W/ CRYSTALS
Crystals, Fluid: NONE SEEN
Eosinophils-Synovial: 4 % — ABNORMAL HIGH (ref 0–1)
Lymphocytes-Synovial Fld: 24 % — ABNORMAL HIGH (ref 0–20)
Monocyte-Macrophage-Synovial Fluid: 1 % — ABNORMAL LOW (ref 50–90)
Neutrophil, Synovial: 71 % — ABNORMAL HIGH (ref 0–25)
WBC, Synovial: UNDETERMINED /mm3 (ref 0–200)

## 2022-06-02 LAB — BASIC METABOLIC PANEL
Anion gap: 8 (ref 5–15)
BUN: 18 mg/dL (ref 6–20)
CO2: 25 mmol/L (ref 22–32)
Calcium: 8.9 mg/dL (ref 8.9–10.3)
Chloride: 104 mmol/L (ref 98–111)
Creatinine, Ser: 0.94 mg/dL (ref 0.44–1.00)
GFR, Estimated: >60 mL/min (ref >60)
Glucose, Bld: 111 mg/dL — ABNORMAL HIGH (ref 70–99)
Potassium: 3.6 mmol/L (ref 3.5–5.1)
Sodium: 137 mmol/L (ref 135–145)

## 2022-06-02 LAB — C-REACTIVE PROTEIN: CRP: 1.2 mg/dL — ABNORMAL HIGH (ref <1.0)

## 2022-06-02 LAB — SEDIMENTATION RATE: Sed Rate: 28 mm/hr — ABNORMAL HIGH (ref 0–22)

## 2022-06-02 MED ORDER — KETOROLAC TROMETHAMINE 60 MG/2ML IM SOLN
30.0000 mg | Freq: Once | INTRAMUSCULAR | Status: DC
  Filled 2022-06-02: qty 2

## 2022-06-02 MED ORDER — HYDROCODONE-ACETAMINOPHEN 5-325 MG PO TABS: 1.0000 | ORAL_TABLET | ORAL | 0 refills | Status: DC | PRN

## 2022-06-02 MED ORDER — LIDOCAINE HCL (PF) 1 % IJ SOLN
5.0000 mL | Freq: Once | INTRAMUSCULAR | Status: AC
  Administered 2022-06-02: 5 mL
  Filled 2022-06-02: qty 5

## 2022-06-02 MED ORDER — DOXYCYCLINE HYCLATE 100 MG PO CAPS: 100.0000 mg | ORAL_CAPSULE | Freq: Two times a day (BID) | ORAL | 0 refills | Status: DC

## 2022-06-02 MED ORDER — DOXYCYCLINE HYCLATE 100 MG PO CAPS: 100.0000 mg | ORAL_CAPSULE | Freq: Two times a day (BID) | ORAL | 0 refills | Status: AC

## 2022-06-02 MED ORDER — HYDROCODONE-ACETAMINOPHEN 5-325 MG PO TABS
2.0000 | ORAL_TABLET | Freq: Once | ORAL | Status: AC
  Administered 2022-06-02: 2 via ORAL
  Filled 2022-06-02: qty 2

## 2022-06-02 MED ORDER — KETOROLAC TROMETHAMINE 15 MG/ML IJ SOLN
15.0000 mg | Freq: Once | INTRAMUSCULAR | Status: AC
  Administered 2022-06-02: 15 mg via INTRAVENOUS
  Filled 2022-06-02: qty 1

## 2022-06-02 NOTE — Discharge Instructions (Addendum)
Please follow-up with podiatry in clinic in a couple of days for repeat assessment.  Return to the emergency department if you develop worsening redness, pain, swelling, warmth of the foot with purulent drainage as this would be an indication for acute infection of the joint.  You have been switched from amoxicillin to doxycycline.  Norco has been provided for pain control.  You can also take ibuprofen.

## 2022-06-02 NOTE — ED Provider Notes (Addendum)
[place] [facility] Provider Note   CSN: [id number] Arrival date & time: 06/02/22  0308     History  Chief Complaint  Patient presents with   Foot Pain    [name] is a 57 y.o. female.   Foot Pain     57 year old female presenting to the emerged part with pain and swelling to the right foot.  The patient states that her second toe developed a small bump that looked like a pimple that subsequently ruptured.  This started last Sunday.  She endorses worsening pain to the second toe of the right foot which is now spread to the first toe.  She endorses pain with range of motion, some difficulty with weightbearing stating that she is having to "hobble" around to ambulate.  Endorses pain radiating up her foot.  No redness on the foot that she can see.  No fevers or chills.  No history of gout.  No trauma or injury to the toe.  She was seen outpatient for this and started on amoxicillin as of Thursday.    Home Medications Prior to Admission medications   Medication Sig Start Date End Date Taking? Authorizing Provider  HYDROcodone-acetaminophen (NORCO/VICODIN) 5-325 MG tablet Take 1-2 tablets by mouth every 4 (four) hours as needed. 06/02/22  Yes Ernie Avena, MD  albuterol (VENTOLIN HFA) 108 (90 Base) MCG/ACT inhaler Inhale 1-2 puffs into the lungs every 6 (six) hours as needed for wheezing or shortness of breath. 04/28/20   Just, Azalee Course, FNP  buPROPion (WELLBUTRIN) 75 MG tablet TAKE 1 TABLET BY MOUTH TWICE A DAY Patient not taking: Reported on 12/03/2019 02/17/19   Lezlie Lye, Meda Coffee, MD  doxycycline (VIBRAMYCIN) 100 MG capsule Take 1 capsule (100 mg total) by mouth 2 (two) times daily for 5 days. 06/02/22 06/07/22  Ernie Avena, MD  Efinaconazole 10 % SOLN Apply 1 application topically daily. 12/03/19   Just, Azalee Course, FNP  escitalopram (LEXAPRO) 20 MG tablet TAKE 1 TABLET BY MOUTH EVERYDAY AT BEDTIME 06/02/19   Lezlie Lye, Meda Coffee, MD   fluticasone [facility]) 50 MCG/ACT nasal spray Place 1 spray into both nostrils daily. 04/17/19   Hall-Potvin, [place], PA-C  hydrOXYzine (ATARAX/VISTARIL) 25 MG tablet TAKE HALF-1 TABLETS BY MOUTH 3 TIMES DAILY AS NEEDED FOR ANXIETY/SLEEP 10/08/19   Lezlie Lye, Meda Coffee, MD  lisinopril-hydrochlorothiazide (ZESTORETIC) 20-25 MG tablet Take 1 tablet by mouth daily. 12/03/19   Just, Azalee Course, FNP  metFORMIN (GLUCOPHAGE) 500 MG tablet TAKE 1 TABLET BY MOUTH EVERY DAY WITH BREAKFAST 12/03/19   Just, Azalee Course, FNP  rosuvastatin (CRESTOR) 10 MG tablet Take 1 tablet (10 mg total) by mouth daily. 12/03/19   Just, Azalee Course, FNP  Vitamin D, Ergocalciferol, (DRISDOL) 1.25 MG (50000 UNIT) CAPS capsule Take 1 capsule (50,000 Units total) by mouth every 7 (seven) days. 12/14/19   Just, Azalee Course, FNP  [place] INHUB 250-50 MCG/DOSE AEPB INHALE 1 PUFF BY MOUTH TWICE A DAY 09/04/19   Lezlie Lye, Meda Coffee, MD      Allergies    Amlodipine benzoate, Bactrim [sulfamethoxazole-trimethoprim], Doxycycline, and Rocephin [ceftriaxone sodium]    Review of Systems   Review of Systems  Musculoskeletal:  Positive for arthralgias.  All other systems reviewed and are negative.   Physical Exam Updated Vital Signs BP 117/76   Pulse 72   Temp 98.1 F (36.7 C) (Oral)   Resp 18   Ht 5\' 4"  (1.626 m)   Wt 117.9 kg  SpO2 100%   BMI 44.63 kg/m  Physical Exam Vitals and nursing note reviewed.  Constitutional:      General: She is not in acute distress. HENT:     Head: Normocephalic and atraumatic.  Eyes:     Conjunctiva/sclera: Conjunctivae normal.     Pupils: Pupils are equal, round, and reactive to light.  Cardiovascular:     Rate and Rhythm: Normal rate and regular rhythm.  Pulmonary:     Effort: Pulmonary effort is normal. No respiratory distress.  Abdominal:     General: There is no distension.     Tenderness: There is no guarding.  Musculoskeletal:        General: No deformity or signs of injury.      Cervical back: Neck supple.     Comments: Healing lesion to the dorsum of the second right toe, pain with range of motion and dorsiflexion of the first right toe, second right toe.  Tenderness to palpation about the MTP joint  Skin:    Findings: No lesion or rash.  Neurological:     General: No focal deficit present.     Mental Status: She is alert. Mental status is at baseline.     ED Results / Procedures / Treatments   Labs (all labs ordered are listed, but only abnormal results are displayed) Labs Reviewed  SYNOVIAL CELL COUNT + DIFF, W/ CRYSTALS - Abnormal; Notable for the following components:      Result Value   Color, Synovial RED (*)    Appearance-Synovial TURBID (*)    Neutrophil, Synovial 71 (*)    Lymphocytes-Synovial Fld 24 (*)    Monocyte-Macrophage-Synovial Fluid 1 (*)    Eosinophils-Synovial 4 (*)    All other components within normal limits  CBC WITH DIFFERENTIAL/PLATELET - Abnormal; Notable for the following components:   Hemoglobin 11.3 (*)    HCT 34.9 (*)    MCV 73.3 (*)    MCH 23.7 (*)    All other components within normal limits  BASIC METABOLIC PANEL - Abnormal; Notable for the following components:   Glucose, Bld 111 (*)    All other components within normal limits  SEDIMENTATION RATE - Abnormal; Notable for the following components:   Sed Rate 28 (*)    All other components within normal limits  CULTURE, BLOOD (ROUTINE X 2)  CULTURE, BLOOD (ROUTINE X 2)  C-REACTIVE PROTEIN    EKG None  Radiology DG Foot Complete Right  Result Date: 06/02/2022 CLINICAL DATA:  Right foot pain and swelling. EXAM: RIGHT FOOT COMPLETE - 3+ VIEW COMPARISON:  None Available. FINDINGS: There is no evidence of fracture or dislocation. There is no evidence of arthropathy or other focal bone abnormality. Soft tissues are unremarkable. IMPRESSION: Negative. Electronically Signed   By: Kennith Center M.D.   On: 06/02/2022 06:21    Procedures .Joint  Aspiration/Arthrocentesis  Date/Time: 06/02/2022 11:10 AM  Performed by: Ernie Avena, MD Authorized by: Ernie Avena, MD   Consent:    Consent obtained:  Written   Consent given by:  Patient   Risks discussed:  Bleeding, infection and pain   Alternatives discussed:  No treatment Universal protocol:    Patient identity confirmed:  Verbally with patient and arm band Location:    Location:  Toe   Toe:  Second toe   Second toe joint:  R second MTP Anesthesia:    Anesthesia method:  Local infiltration   Local anesthetic:  Lidocaine 1% w/o epi Procedure details:  Preparation: Patient was prepped and draped in usual sterile fashion     Needle gauge: 25G.   Approach:  Medial   Aspirate amount:  0.78mL   Aspirate characteristics:  Blood-tinged and serous Post-procedure details:    Procedure completion:  Tolerated     Medications Ordered in ED Medications  ketorolac (TORADOL) 15 MG/ML injection 15 mg (15 mg Intravenous Given 06/02/22 0818)  lidocaine (PF) (XYLOCAINE) 1 % injection 5 mL (5 mLs Infiltration Given 06/02/22 0839)    ED Course/ Medical Decision Making/ A&P Clinical Course as of 06/02/22 1310  Sat Jun 02, 2022  1019 WBC: 8.5 [JL]  1020 Sed Rate(!): 28 [JL]    Clinical Course User Index [JL] Ernie Avena, MD                             Medical Decision Making Amount and/or Complexity of Data Reviewed Labs: ordered. Decision-making details documented in ED Course. Radiology: ordered.  Risk Prescription drug management.     57 year old female presenting to the emerged part with pain and swelling to the right foot.  The patient states that her second toe developed a small bump that looked like a pimple that subsequently ruptured.  This started last Sunday.  She endorses worsening pain to the second toe of the right foot which is now spread to the first toe.  She endorses pain with range of motion, some difficulty with weightbearing stating that she is  having to "hobble" around to ambulate.  Endorses pain radiating up her foot.  No redness on the foot that she can see.  No fevers or chills.  No history of gout.  No trauma or injury to the toe.  She was seen outpatient for this and started on amoxicillin as of Thursday.    On arrival, the patient was afebrile, not tachycardic or tachypneic, BP 150/79, saturating 100% on room air.  Physical exam concerning for the following:  Healing lesion to the dorsum of the second right toe, pain with range of motion and dorsiflexion of the first right toe, second right toe.  Tenderness to palpation about the MTP joint.  Differential diagnosis includes gout, pseudogout, septic arthritis, osteoarthritis, reactive arthritis, fracture, cellulitis, tendinitis, bursitis.  XR Right foot: Negative  I discussed with the patient the need for further diagnostic testing to include arthrocentesis which the patient consented to do.  IM Toradol was administered for pain control.  Arthrocentesis of the toe was performed, minimal aspiration of the joint without frank pus, sent for cell count. The patient has no erythema, no warmth, no purulent drainage, no significant purulence was aspirated on arthrocentesis.  No crystals seen on analysis of the fluid pointing away from gout.  Not enough sample was obtained to achieve full white blood cell count.  I spoke with Dr. Annamary Rummage of on-call podiatry who did not feel that the patient needed repeat arthrocentesis at this time, plan for close follow-up in clinic with podiatry, we will switch the patient to doxycycline, advised Norco and NSAIDs for pain control.  A postop shoe was provided for comfort.  Return precautions provided in the event of worsening signs and symptoms of septic arthritis.      Final Clinical Impression(s) / ED Diagnoses Final diagnoses:  Foot pain, right  Pain of toe of right foot    Rx / DC Orders ED Discharge Orders          Ordered  doxycycline  (VIBRAMYCIN) 100 MG capsule  2 times daily,   Status:  Discontinued        06/02/22 1302    doxycycline (VIBRAMYCIN) 100 MG capsule  2 times daily        06/02/22 1308    HYDROcodone-acetaminophen (NORCO/VICODIN) 5-325 MG tablet  Every 4 hours PRN        06/02/22 1308              Ernie Avena, MD 06/02/22 1311    Ernie Avena, MD 06/02/22 1311

## 2022-06-02 NOTE — ED Triage Notes (Signed)
Pt reports worsening swelling to her RT foot, specifically the big toe and 2nd toe. Pt has a small bump to the top of second toe and it appears swollen. She reports there is pain to the bend of the second toe and radiating to large RT toe and the swelling is getting worse. Given abx for it on Thursday.

## 2022-06-03 LAB — CULTURE, BLOOD (ROUTINE X 2): Culture: NO GROWTH

## 2022-06-04 LAB — CULTURE, BLOOD (ROUTINE X 2)
Culture: NO GROWTH
Special Requests: ADEQUATE

## 2022-06-06 LAB — CULTURE, BLOOD (ROUTINE X 2)

## 2022-06-07 DIAGNOSIS — Z6841 Body Mass Index (BMI) 40.0 and over, adult: Secondary | ICD-10-CM | POA: Diagnosis not present

## 2022-06-07 DIAGNOSIS — L02611 Cutaneous abscess of right foot: Secondary | ICD-10-CM | POA: Diagnosis not present

## 2022-06-11 DIAGNOSIS — E1169 Type 2 diabetes mellitus with other specified complication: Secondary | ICD-10-CM | POA: Diagnosis not present

## 2022-06-11 DIAGNOSIS — I129 Hypertensive chronic kidney disease with stage 1 through stage 4 chronic kidney disease, or unspecified chronic kidney disease: Secondary | ICD-10-CM | POA: Diagnosis not present

## 2022-06-11 DIAGNOSIS — Z1231 Encounter for screening mammogram for malignant neoplasm of breast: Secondary | ICD-10-CM | POA: Diagnosis not present

## 2022-06-11 DIAGNOSIS — N182 Chronic kidney disease, stage 2 (mild): Secondary | ICD-10-CM | POA: Diagnosis not present

## 2022-06-11 DIAGNOSIS — Z6841 Body Mass Index (BMI) 40.0 and over, adult: Secondary | ICD-10-CM | POA: Diagnosis not present

## 2022-06-11 DIAGNOSIS — E785 Hyperlipidemia, unspecified: Secondary | ICD-10-CM | POA: Diagnosis not present

## 2022-09-18 DIAGNOSIS — E785 Hyperlipidemia, unspecified: Secondary | ICD-10-CM | POA: Diagnosis not present

## 2022-09-18 DIAGNOSIS — E1169 Type 2 diabetes mellitus with other specified complication: Secondary | ICD-10-CM | POA: Diagnosis not present

## 2022-09-18 DIAGNOSIS — I129 Hypertensive chronic kidney disease with stage 1 through stage 4 chronic kidney disease, or unspecified chronic kidney disease: Secondary | ICD-10-CM | POA: Diagnosis not present

## 2022-09-24 DIAGNOSIS — E1169 Type 2 diabetes mellitus with other specified complication: Secondary | ICD-10-CM | POA: Diagnosis not present

## 2022-09-24 DIAGNOSIS — N1831 Chronic kidney disease, stage 3a: Secondary | ICD-10-CM | POA: Diagnosis not present

## 2022-09-24 DIAGNOSIS — I10 Essential (primary) hypertension: Secondary | ICD-10-CM | POA: Diagnosis not present

## 2022-09-24 DIAGNOSIS — E785 Hyperlipidemia, unspecified: Secondary | ICD-10-CM | POA: Diagnosis not present

## 2022-09-24 DIAGNOSIS — Z6841 Body Mass Index (BMI) 40.0 and over, adult: Secondary | ICD-10-CM | POA: Diagnosis not present

## 2023-01-01 DIAGNOSIS — I129 Hypertensive chronic kidney disease with stage 1 through stage 4 chronic kidney disease, or unspecified chronic kidney disease: Secondary | ICD-10-CM | POA: Diagnosis not present

## 2023-01-01 DIAGNOSIS — E785 Hyperlipidemia, unspecified: Secondary | ICD-10-CM | POA: Diagnosis not present

## 2023-01-01 DIAGNOSIS — E1169 Type 2 diabetes mellitus with other specified complication: Secondary | ICD-10-CM | POA: Diagnosis not present

## 2023-01-07 DIAGNOSIS — Z6839 Body mass index (BMI) 39.0-39.9, adult: Secondary | ICD-10-CM | POA: Diagnosis not present

## 2023-01-07 DIAGNOSIS — E785 Hyperlipidemia, unspecified: Secondary | ICD-10-CM | POA: Diagnosis not present

## 2023-01-07 DIAGNOSIS — I10 Essential (primary) hypertension: Secondary | ICD-10-CM | POA: Diagnosis not present

## 2023-01-07 DIAGNOSIS — T782XXA Anaphylactic shock, unspecified, initial encounter: Secondary | ICD-10-CM | POA: Diagnosis not present

## 2023-01-07 DIAGNOSIS — E1169 Type 2 diabetes mellitus with other specified complication: Secondary | ICD-10-CM | POA: Diagnosis not present

## 2023-02-04 DIAGNOSIS — Z6839 Body mass index (BMI) 39.0-39.9, adult: Secondary | ICD-10-CM | POA: Diagnosis not present

## 2023-02-04 DIAGNOSIS — I10 Essential (primary) hypertension: Secondary | ICD-10-CM | POA: Diagnosis not present

## 2023-02-04 DIAGNOSIS — N1831 Chronic kidney disease, stage 3a: Secondary | ICD-10-CM | POA: Diagnosis not present

## 2023-02-13 ENCOUNTER — Other Ambulatory Visit: Payer: Self-pay

## 2023-02-13 ENCOUNTER — Encounter: Payer: Self-pay | Admitting: Internal Medicine

## 2023-02-13 ENCOUNTER — Ambulatory Visit: Payer: BC Managed Care – PPO | Admitting: Internal Medicine

## 2023-02-13 VITALS — BP 128/74 | HR 69 | Temp 98.1°F | Resp 16 | Ht 64.0 in | Wt 230.1 lb

## 2023-02-13 DIAGNOSIS — T7840XA Allergy, unspecified, initial encounter: Secondary | ICD-10-CM | POA: Diagnosis not present

## 2023-02-13 DIAGNOSIS — J3089 Other allergic rhinitis: Secondary | ICD-10-CM | POA: Diagnosis not present

## 2023-02-13 DIAGNOSIS — J4540 Moderate persistent asthma, uncomplicated: Secondary | ICD-10-CM | POA: Diagnosis not present

## 2023-02-13 DIAGNOSIS — T7840XD Allergy, unspecified, subsequent encounter: Secondary | ICD-10-CM | POA: Diagnosis not present

## 2023-02-13 MED ORDER — CETIRIZINE HCL 10 MG PO TABS
10.0000 mg | ORAL_TABLET | Freq: Every day | ORAL | 5 refills | Status: AC | PRN
Start: 2023-02-13 — End: ?

## 2023-02-13 MED ORDER — EPINEPHRINE 0.3 MG/0.3ML IJ SOAJ: 0.3000 mg | INTRAMUSCULAR | 1 refills | Status: AC

## 2023-02-13 NOTE — Patient Instructions (Addendum)
 Allergic Reaction - Unclear etiology.  Will obtain tryptase and sIgE to blueberry/strawberry.  - Did discuss possibility of idiopathic urticaria. - Keep diary for any further reactions.  - Would recommend trying Zyrtec  10mg  twice daily and Pepcid 20mg  twice daily, if hives recur.  - for SKIN + ANY additional symptoms, OR IF concern for LIFE THREATENING reaction = Epipen  Autoinjector EpiPen  0.3 mg. - If using Epinephrine  autoinjector, call 911 or go to the ER.   Moderate Persistent Asthma: - Maintenance inhaler: continue Wixela 250-73mcg 1 puff daily.  If symptoms worsen or with respiratory illness, use it 1 puff twice daily. - Rescue inhaler: Albuterol  2 puffs every 4-6 hours as needed for respiratory symptoms of shortness of breath or wheezing Asthma control goals:  Full participation in all desired activities (may need albuterol  before activity) Albuterol  use two times or less a week on average (not counting use with activity) Cough interfering with sleep two times or less a month Oral steroids no more than once a year No hospitalizations  Other Allergic Rhinitis: - Will obtain blood testing to environmental allergens.  - Use nasal saline rinses such as with Neilmed Sinus Rinse as needed.  Use distilled water.   - Use Zyrtec  10 mg daily as needed for runny nose, sneezing, itchy watery eyes.

## 2023-02-13 NOTE — Progress Notes (Signed)
 NEW PATIENT  Date of Service/Encounter:  02/13/23  Consult requested by: Melonie Colonel, Mikel HERO, MD   Subjective:   [name] (DOB: [birth year]) is a 58 y.o. female who presents to the clinic on 02/13/2023 with a chief complaint of Advice Only (Consult only) and Asthma .    History obtained from: chart review and patient.   Allergic Reaction: Occurred in Dec 11 2022.  Ate red berry crepe at [employer] and [place] to the resort, took a shower.  Broke out in prickly, itchy, red hives on arms, neck, chest.  Also felt like her throat was closing and had SOB.  Took hydroxyzine /benadryl  without much relief.  Then took Epipen  from her daugther and still not better so she went to the ED and was given IV benadryl /solumedrol/pepcid/NS/duonebs.  Had some improvement but hives took few days to resolve.  Has had hives about 25 years ago, after eating frozen meatballs. Now eats meatballs without any issues, wonders if it was a preservative in the food.   Also eats dairy/wheat/eggs without any issues. No symptoms after ingestion of red meats.  No illness at the time.  Sometimes get a little itchy mouth with strawberries but no other symptoms.   Asthma:  Diagnosed in childhood.  When younger it was much worse but has been controlled on Wixela the past few years with Dr Melonie PCP.  Does have some shortness of breath with cold air exposure.   Works out regularly in [employer] and does sometimes require Albuterol  with intense aerobic activity with pretreatment.  Usually with activity in cold weather, few times a week daytime symptoms in past month, none nighttime awakenings in past month Using rescue inhaler: few times a week, usually pretreats prior to exercise  Limitations to daily activity: mild 0 ED visits/UC visits and 0 oral steroids in the past year multiple number of lifetime hospitalizations when younger, 0 number of lifetime intubations.  Identified Triggers:  cold air and exercise Prior PFTs or  spirometry: none available for review.  Current regimen:  Maintenance: Wixela 250-50 1 puff daily  Rescue: Albuterol  2 puffs q4-6 hrs PRN  Rhinitis:  Started when younger. 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 Recalls having to get Epi with skin testing in the past.   Symptoms include: runny nose, drainage, congestion  Occurs seasonally-Spring/Fall   Treatments tried:  PRN anti histamines.  Previous allergy testing: yes'; many years ago.  History of sinus surgery: no   Reviewed:  12/11/2022: seen in ED after getting out the shower and having hives and noting SOB.  Took benadryl , hydroxyzine , Epi with improvement in rash but still some SOB.  SaO2 100%, BP 141/99, HR 113.  Given Solumedrol/benadryl /Pepcid/Duoneb in ED with relief.   12/03/2019: seen by Just NP for moderate persistent asthma, HTN, HLD, DM. Controlled on Wixela.   04/17/2019: seen in ED for dry throat, facial swelling, SOB.  No angioedema on exam. Trace wheezing and bl turbinates with pallor.  Discussed likely allergies + asthma flare. Use Albuterol  PRN.   Past Medical History: Past Medical History:  Diagnosis Date   Allergy    Asthma    Cushing syndrome (HCC)    ?-pt states she was given this diagnosis during a hospitalization.  Records reviewed, no mention of dx or treatment   Diabetes mellitus    Heart murmur    mild TR   Hyperlipidemia    Hypertension    Sleep apnea     Past Surgical History: Past  Surgical History:  Procedure Laterality Date   CERVICAL CONIZATION W/BX  1999   CHOLECYSTECTOMY N/A 01/31/2014   Procedure: LAPAROSCOPIC CHOLECYSTECTOMY ;  Surgeon: Vicenta Poli, MD;  Location: [facility] OR;  Service: General;  Laterality: N/A;   GALLBLADDER SURGERY  Jan 31, 2014   TUBAL LIGATION  01/10/2000    Family History: Family History  Problem Relation Age of Onset   Thyroid  disease Sister    Hyperlipidemia Mother    Heart murmur Daughter    Heart murmur Son    Heart murmur Daughter    Asthma  Daughter    Hypertension Father    Cancer Cousin 89       female cousin--unknown primary, metastatic   Pulmonary embolism Daughter 25   Diabetes Neg Hx     Social History:  Flooring in bedroom: engineer, civil (consulting) Pets: none Tobacco use/exposure: none Job: production designer, theatre/television/film   Medication List:  Allergies as of 02/13/2023       Reactions   Amlodipine  Benzoate    Dizziness, light headness, nausea   Bactrim  [sulfamethoxazole -trimethoprim ]    Lip swelling, mouth itching   Doxycycline     Severe headache   Rocephin [ceftriaxone Sodium] Hives, Itching, Other (See Comments)   wheezing        Medication List        Accurate as of February 13, 2023 11:13 AM. If you have any questions, ask your nurse or doctor.          STOP taking these medications    buPROPion  75 MG tablet Commonly known as: WELLBUTRIN  Stopped by: Arleta SHAUNNA Blanch   Efinaconazole  10 % Soln Stopped by: Arleta SHAUNNA Blanch   escitalopram  20 MG tablet Commonly known as: LEXAPRO  Stopped by: Arleta SHAUNNA Blanch   fluticasone  50 MCG/ACT nasal spray Commonly known as: FLONASE  Stopped by: Arleta SHAUNNA Blanch   HYDROcodone -acetaminophen  5-325 MG tablet Commonly known as: NORCO/VICODIN Stopped by: Arleta SHAUNNA Blanch   metFORMIN  500 MG tablet Commonly known as: GLUCOPHAGE  Stopped by: Arleta SHAUNNA Blanch   Vitamin D  (Ergocalciferol ) 1.25 MG (50000 UNIT) Caps capsule Commonly known as: DRISDOL  Stopped by: Arleta SHAUNNA Blanch       TAKE these medications    albuterol  108 (90 Base) MCG/ACT inhaler Commonly known as: VENTOLIN  HFA Inhale 1-2 puffs into the lungs every 6 (six) hours as needed for wheezing or shortness of breath.   EPINEPHrine  0.3 mg/0.3 mL Soaj injection Commonly known as: EPI-PEN Inject 0.3 mg into the muscle as directed.   ezetimibe 10 MG tablet Commonly known as: ZETIA Take 10 mg by mouth daily.   hydrOXYzine  25 MG tablet Commonly known as: ATARAX  TAKE HALF-1 TABLETS BY MOUTH 3 TIMES DAILY AS NEEDED FOR ANXIETY/SLEEP    lisinopril -hydrochlorothiazide  20-25 MG tablet Commonly known as: ZESTORETIC  Take 1 tablet by mouth daily.   Ozempic (1 MG/DOSE) 4 MG/3ML Sopn Generic drug: Semaglutide (1 MG/DOSE) Inject 1 mg into the skin once a week.   rosuvastatin  10 MG tablet Commonly known as: Crestor  Take 1 tablet (10 mg total) by mouth daily.   Wixela Inhub 250-50 MCG/DOSE Aepb Generic drug: fluticasone -salmeterol INHALE 1 PUFF BY MOUTH TWICE A DAY         REVIEW OF SYSTEMS: Pertinent positives and negatives discussed in HPI.   Objective:   Physical Exam: BP 128/74 (BP Location: Left Arm, Patient Position: Sitting, Cuff Size: Normal)   Pulse 69   Temp 98.1 F (36.7 C) (Temporal)   Resp 16   Ht 5' 4 (1.626 m)  Wt 230 lb 1.6 oz (104.4 kg)   SpO2 100%   BMI 39.50 kg/m  Body mass index is 39.5 kg/m. GEN: alert, well developed HEENT: clear conjunctiva, nose with + mild inferior turbinate hypertrophy, pink nasal mucosa, no rhinorrhea, no cobblestoning HEART: regular rate and rhythm, no murmur LUNGS: clear to auscultation bilaterally, no coughing, unlabored respiration ABDOMEN: soft, non distended  SKIN: no rashes or lesions  Spirometry:  Tracings reviewed. Her effort: Good reproducible efforts. FVC: 2.67L, 95% predicted FEV1: 1.85L, 82% predicted FEV1/FVC ratio: 69% Interpretation: Spirometry consistent with normal pattern.  Please see scanned spirometry results for details.  Assessment:   1. Other allergic rhinitis   2. Allergic reaction, initial encounter   3. Moderate persistent asthma without complication     Plan/Recommendations:  Allergic Reaction - Reaction of hives, trouble breathing after eating berry crepe at [facility] 12/2022.  Unclear etiology.  Will obtain tryptase and sIgE to blueberry/strawberry. Already tolerating wheat/dairy/eggs without any issues so will not test for those.  - Did discuss possibility of idiopathic urticaria. - Keep diary for any further reactions.   - Would recommend trying Zyrtec  10mg  twice daily and Pepcid 20mg  twice daily, if hives recur.  - for SKIN + ANY additional symptoms, OR IF concern for LIFE THREATENING reaction = Epipen  Autoinjector EpiPen  0.3 mg. - If using Epinephrine  autoinjector, call 911 or go to the ER.    Moderate Persistent Asthma: - Spirometry normal.  Followed by PCP, controlled.  - Maintenance inhaler: continue Wixela 250-50mcg 1 puff daily.  If symptoms worsen or with respiratory illness, use it 1 puff twice daily. - Rescue inhaler: Albuterol  2 puffs via spacer or 1 vial via nebulizer every 4-6 hours as needed for respiratory symptoms of shortness of breath or wheezing Asthma control goals:  Full participation in all desired activities (may need albuterol  before activity) Albuterol  use two times or less a week on average (not counting use with activity) Cough interfering with sleep two times or less a month Oral steroids no more than once a year No hospitalizations  Other Allergic Rhinitis: - Will obtain blood testing to environmental allergens.  - Use nasal saline rinses such as with Neilmed Sinus Rinse as needed.  Use distilled water.   - Use Zyrtec  10 mg daily as needed for runny nose, sneezing, itchy watery eyes.      Return in about 3 months (around 05/14/2023).  Arleta Blanch, MD [facility] 

## 2023-02-15 LAB — ALLERGENS W/TOTAL IGE AREA 2
Alternaria Alternata IgE: 45.8 kU/L — AB
Aspergillus Fumigatus IgE: 11 kU/L — AB
Bermuda Grass IgE: 0.52 kU/L — AB
Cat Dander IgE: 0.11 kU/L — AB
Cedar, Mountain IgE: 3.23 kU/L — AB
Cladosporium Herbarum IgE: 6.77 kU/L — AB
Cockroach, German IgE: 1.68 kU/L — AB
Common Silver Birch IgE: 1.26 kU/L — AB
Cottonwood IgE: 1.16 kU/L — AB
D Farinae IgE: 9.51 kU/L — AB
D Pteronyssinus IgE: 14.5 kU/L — AB
Dog Dander IgE: 0.21 kU/L — AB
Elm, American IgE: 0.9 kU/L — AB
IgE (Immunoglobulin E), Serum: 2470 [IU]/mL — ABNORMAL HIGH (ref 6–495)
Johnson Grass IgE: 0.6 kU/L — AB
Maple/Box Elder IgE: 1.04 kU/L — AB
Mouse Urine IgE: <0.1 kU/L
Oak, White IgE: 0.55 kU/L — AB
Pecan, Hickory IgE: 4.78 kU/L — AB
Penicillium Chrysogen IgE: 19 kU/L — AB
Pigweed, Rough IgE: 0.72 kU/L — AB
Ragweed, Short IgE: 1.77 kU/L — AB
Sheep Sorrel IgE Qn: 0.6 kU/L — AB
Timothy Grass IgE: 2.97 kU/L — AB
White Mulberry IgE: 0.28 kU/L — AB

## 2023-02-15 LAB — ALLERGEN, BLUEBERRY, RF288: Allergen Blueberry IgE: 0.14 kU/L — AB

## 2023-02-15 LAB — ALLERGEN, STRAWBERRY, F44: Allergen Strawberry IgE: 0.56 kU/L — AB

## 2023-02-15 LAB — TRYPTASE: Tryptase: 6.2 ug/L (ref 2.2–13.2)

## 2023-02-26 ENCOUNTER — Encounter: Payer: Self-pay | Admitting: Internal Medicine

## 2023-04-08 DIAGNOSIS — E1169 Type 2 diabetes mellitus with other specified complication: Secondary | ICD-10-CM | POA: Diagnosis not present

## 2023-04-08 DIAGNOSIS — I129 Hypertensive chronic kidney disease with stage 1 through stage 4 chronic kidney disease, or unspecified chronic kidney disease: Secondary | ICD-10-CM | POA: Diagnosis not present

## 2023-04-08 DIAGNOSIS — E785 Hyperlipidemia, unspecified: Secondary | ICD-10-CM | POA: Diagnosis not present

## 2023-04-15 DIAGNOSIS — Z1231 Encounter for screening mammogram for malignant neoplasm of breast: Secondary | ICD-10-CM | POA: Diagnosis not present

## 2023-04-16 DIAGNOSIS — N182 Chronic kidney disease, stage 2 (mild): Secondary | ICD-10-CM | POA: Diagnosis not present

## 2023-04-16 DIAGNOSIS — Z6841 Body Mass Index (BMI) 40.0 and over, adult: Secondary | ICD-10-CM | POA: Diagnosis not present

## 2023-04-16 DIAGNOSIS — E1169 Type 2 diabetes mellitus with other specified complication: Secondary | ICD-10-CM | POA: Diagnosis not present

## 2023-04-16 DIAGNOSIS — I129 Hypertensive chronic kidney disease with stage 1 through stage 4 chronic kidney disease, or unspecified chronic kidney disease: Secondary | ICD-10-CM | POA: Diagnosis not present

## 2023-04-16 DIAGNOSIS — E785 Hyperlipidemia, unspecified: Secondary | ICD-10-CM | POA: Diagnosis not present

## 2023-08-13 DIAGNOSIS — I129 Hypertensive chronic kidney disease with stage 1 through stage 4 chronic kidney disease, or unspecified chronic kidney disease: Secondary | ICD-10-CM | POA: Diagnosis not present

## 2023-08-13 DIAGNOSIS — E785 Hyperlipidemia, unspecified: Secondary | ICD-10-CM | POA: Diagnosis not present

## 2023-08-13 DIAGNOSIS — E1169 Type 2 diabetes mellitus with other specified complication: Secondary | ICD-10-CM | POA: Diagnosis not present

## 2023-08-27 DIAGNOSIS — Z6838 Body mass index (BMI) 38.0-38.9, adult: Secondary | ICD-10-CM | POA: Diagnosis not present

## 2023-08-27 DIAGNOSIS — I129 Hypertensive chronic kidney disease with stage 1 through stage 4 chronic kidney disease, or unspecified chronic kidney disease: Secondary | ICD-10-CM | POA: Diagnosis not present

## 2023-08-27 DIAGNOSIS — E785 Hyperlipidemia, unspecified: Secondary | ICD-10-CM | POA: Diagnosis not present

## 2023-08-27 DIAGNOSIS — N182 Chronic kidney disease, stage 2 (mild): Secondary | ICD-10-CM | POA: Diagnosis not present

## 2023-08-27 DIAGNOSIS — E1169 Type 2 diabetes mellitus with other specified complication: Secondary | ICD-10-CM | POA: Diagnosis not present

## 2023-12-25 DIAGNOSIS — E1169 Type 2 diabetes mellitus with other specified complication: Secondary | ICD-10-CM | POA: Diagnosis not present

## 2023-12-25 DIAGNOSIS — I129 Hypertensive chronic kidney disease with stage 1 through stage 4 chronic kidney disease, or unspecified chronic kidney disease: Secondary | ICD-10-CM | POA: Diagnosis not present

## 2023-12-25 DIAGNOSIS — E785 Hyperlipidemia, unspecified: Secondary | ICD-10-CM | POA: Diagnosis not present

## 2023-12-31 DIAGNOSIS — N289 Disorder of kidney and ureter, unspecified: Secondary | ICD-10-CM | POA: Diagnosis not present

## 2023-12-31 DIAGNOSIS — Z6836 Body mass index (BMI) 36.0-36.9, adult: Secondary | ICD-10-CM | POA: Diagnosis not present

## 2023-12-31 DIAGNOSIS — J4550 Severe persistent asthma, uncomplicated: Secondary | ICD-10-CM | POA: Diagnosis not present

## 2023-12-31 DIAGNOSIS — Z1231 Encounter for screening mammogram for malignant neoplasm of breast: Secondary | ICD-10-CM | POA: Diagnosis not present

## 2023-12-31 DIAGNOSIS — E1169 Type 2 diabetes mellitus with other specified complication: Secondary | ICD-10-CM | POA: Diagnosis not present

## 2023-12-31 DIAGNOSIS — E785 Hyperlipidemia, unspecified: Secondary | ICD-10-CM | POA: Diagnosis not present

## 2024-03-09 ENCOUNTER — Encounter: Payer: Self-pay | Admitting: Gastroenterology
